# Patient Record
Sex: Female | Born: 1935 | Race: White | Hispanic: No | Smoking: Never smoker
Health system: Southern US, Community
[De-identification: ages and names within clinical notes are randomized; demographics above are authoritative.]

## PROBLEM LIST (undated history)

## (undated) DIAGNOSIS — G609 Hereditary and idiopathic neuropathy, unspecified: Secondary | ICD-10-CM

## (undated) DIAGNOSIS — I10 Essential (primary) hypertension: Secondary | ICD-10-CM

## (undated) DIAGNOSIS — F419 Anxiety disorder, unspecified: Secondary | ICD-10-CM

## (undated) DIAGNOSIS — E079 Disorder of thyroid, unspecified: Secondary | ICD-10-CM

## (undated) DIAGNOSIS — C50911 Malignant neoplasm of unspecified site of right female breast: Secondary | ICD-10-CM

## (undated) DIAGNOSIS — N189 Chronic kidney disease, unspecified: Secondary | ICD-10-CM

## (undated) DIAGNOSIS — A310 Pulmonary mycobacterial infection: Secondary | ICD-10-CM

## (undated) HISTORY — PX: BREAST LUMPECTOMY: SHX2

## (undated) HISTORY — PX: CHOLECYSTECTOMY: SHX55

## (undated) HISTORY — DX: Disorder of thyroid, unspecified: E07.9

## (undated) HISTORY — DX: Essential (primary) hypertension: I10

## (undated) HISTORY — PX: BACK SURGERY: SHX140

## (undated) HISTORY — PX: BREAST BIOPSY: SHX20

## (undated) HISTORY — DX: Anxiety disorder, unspecified: F41.9

## (undated) HISTORY — PX: BREAST SURGERY: SHX581

## (undated) HISTORY — DX: Chronic kidney disease, unspecified: N18.9

## (undated) HISTORY — DX: Hereditary and idiopathic neuropathy, unspecified: G60.9

---

## 1999-02-26 DIAGNOSIS — C50911 Malignant neoplasm of unspecified site of right female breast: Secondary | ICD-10-CM

## 1999-02-26 HISTORY — PX: BREAST EXCISIONAL BIOPSY: SUR124

## 1999-02-26 HISTORY — PX: BREAST LUMPECTOMY: SHX2

## 1999-02-26 HISTORY — DX: Malignant neoplasm of unspecified site of right female breast: C50.911

## 2001-04-24 ENCOUNTER — Other Ambulatory Visit: Admission: RE | Admit: 2001-04-24 | Discharge: 2001-04-24 | Payer: Self-pay | Admitting: Obstetrics and Gynecology

## 2004-05-14 ENCOUNTER — Ambulatory Visit: Payer: Self-pay | Admitting: Internal Medicine

## 2004-09-26 ENCOUNTER — Encounter: Payer: Self-pay | Admitting: Internal Medicine

## 2004-10-26 ENCOUNTER — Encounter: Payer: Self-pay | Admitting: Internal Medicine

## 2004-11-25 ENCOUNTER — Encounter: Payer: Self-pay | Admitting: Internal Medicine

## 2004-12-26 ENCOUNTER — Encounter: Payer: Self-pay | Admitting: Internal Medicine

## 2006-10-21 ENCOUNTER — Encounter: Payer: Self-pay | Admitting: Neurology

## 2006-10-27 ENCOUNTER — Encounter: Payer: Self-pay | Admitting: Neurology

## 2006-11-26 ENCOUNTER — Encounter: Payer: Self-pay | Admitting: Neurology

## 2008-06-08 ENCOUNTER — Emergency Department: Payer: Self-pay | Admitting: Emergency Medicine

## 2009-03-15 ENCOUNTER — Ambulatory Visit: Payer: Self-pay | Admitting: Gastroenterology

## 2009-08-21 ENCOUNTER — Emergency Department: Payer: Self-pay | Admitting: Emergency Medicine

## 2009-08-24 ENCOUNTER — Ambulatory Visit: Payer: Self-pay | Admitting: Orthopedic Surgery

## 2009-12-25 ENCOUNTER — Emergency Department: Payer: Self-pay | Admitting: Emergency Medicine

## 2010-08-11 ENCOUNTER — Emergency Department: Payer: Self-pay | Admitting: Emergency Medicine

## 2010-09-18 ENCOUNTER — Ambulatory Visit: Payer: Self-pay | Admitting: Otolaryngology

## 2010-12-31 DIAGNOSIS — H469 Unspecified optic neuritis: Secondary | ICD-10-CM | POA: Insufficient documentation

## 2010-12-31 DIAGNOSIS — H40009 Preglaucoma, unspecified, unspecified eye: Secondary | ICD-10-CM | POA: Insufficient documentation

## 2010-12-31 DIAGNOSIS — H53412 Scotoma involving central area, left eye: Secondary | ICD-10-CM | POA: Insufficient documentation

## 2010-12-31 DIAGNOSIS — IMO0002 Reserved for concepts with insufficient information to code with codable children: Secondary | ICD-10-CM | POA: Insufficient documentation

## 2011-07-15 DIAGNOSIS — C50919 Malignant neoplasm of unspecified site of unspecified female breast: Secondary | ICD-10-CM | POA: Insufficient documentation

## 2011-07-15 DIAGNOSIS — Z923 Personal history of irradiation: Secondary | ICD-10-CM | POA: Insufficient documentation

## 2012-07-03 ENCOUNTER — Ambulatory Visit: Payer: Self-pay | Admitting: Otolaryngology

## 2012-07-03 LAB — CREATININE, SERUM
Creatinine: 1.19 mg/dL (ref 0.60–1.30)
EGFR (African American): 51 — ABNORMAL LOW
EGFR (Non-African Amer.): 44 — ABNORMAL LOW

## 2013-09-19 DIAGNOSIS — F410 Panic disorder [episodic paroxysmal anxiety] without agoraphobia: Secondary | ICD-10-CM | POA: Insufficient documentation

## 2013-09-19 DIAGNOSIS — J479 Bronchiectasis, uncomplicated: Secondary | ICD-10-CM | POA: Insufficient documentation

## 2013-09-19 DIAGNOSIS — I119 Hypertensive heart disease without heart failure: Secondary | ICD-10-CM | POA: Insufficient documentation

## 2013-09-19 DIAGNOSIS — E039 Hypothyroidism, unspecified: Secondary | ICD-10-CM | POA: Insufficient documentation

## 2013-09-19 DIAGNOSIS — G500 Trigeminal neuralgia: Secondary | ICD-10-CM | POA: Insufficient documentation

## 2013-09-19 DIAGNOSIS — F325 Major depressive disorder, single episode, in full remission: Secondary | ICD-10-CM | POA: Insufficient documentation

## 2013-09-19 DIAGNOSIS — M8580 Other specified disorders of bone density and structure, unspecified site: Secondary | ICD-10-CM | POA: Insufficient documentation

## 2013-10-07 DIAGNOSIS — E782 Mixed hyperlipidemia: Secondary | ICD-10-CM | POA: Insufficient documentation

## 2014-10-12 DIAGNOSIS — Z0000 Encounter for general adult medical examination without abnormal findings: Secondary | ICD-10-CM | POA: Insufficient documentation

## 2015-09-06 ENCOUNTER — Encounter: Payer: Self-pay | Admitting: Psychiatry

## 2015-09-06 ENCOUNTER — Ambulatory Visit (INDEPENDENT_AMBULATORY_CARE_PROVIDER_SITE_OTHER): Payer: 59 | Admitting: Psychiatry

## 2015-09-06 DIAGNOSIS — I10 Essential (primary) hypertension: Secondary | ICD-10-CM | POA: Insufficient documentation

## 2015-09-06 DIAGNOSIS — F411 Generalized anxiety disorder: Secondary | ICD-10-CM | POA: Diagnosis not present

## 2015-09-06 DIAGNOSIS — F332 Major depressive disorder, recurrent severe without psychotic features: Secondary | ICD-10-CM | POA: Diagnosis not present

## 2015-09-06 MED ORDER — LORAZEPAM 0.5 MG PO TABS: 0.5000 mg | ORAL_TABLET | Freq: Three times a day (TID) | ORAL | Status: DC

## 2015-09-06 MED ORDER — ESCITALOPRAM OXALATE 5 MG PO TABS: 5.0000 mg | ORAL_TABLET | Freq: Every day | ORAL | Status: DC

## 2015-09-06 MED ORDER — QUETIAPINE FUMARATE 25 MG PO TABS: 25.0000 mg | ORAL_TABLET | Freq: Every day | ORAL | Status: DC

## 2015-09-06 MED ORDER — BUPROPION HCL 75 MG PO TABS: 75.0000 mg | ORAL_TABLET | Freq: Every day | ORAL | Status: DC

## 2015-09-06 NOTE — Progress Notes (Signed)
Psychiatric Initial Adult Assessment   Patient Identification: Christine Vaughan MRN:  [id number] Date of Evaluation:  09/06/2015 Referral Source: PCP Chief Complaint:   Visit Diagnosis:    ICD-9-CM ICD-10-CM   1. Severe episode of recurrent major depressive disorder, without psychotic features ([place]) 296.33 F33.2   2. GAD (generalized anxiety disorder) 300.02 F41.1     History of Present Illness:    Patient is a 82 year old female who presented for initial assessment accompanied by her her. She was referred by her primary care physician Dr. Ouida Sills. She reported that she has long history of depression and anxiety. She reported that she is unable to sleep has been having more anxiety. Her daughter reported that she currently lives with her husband. Patient has lack of energy and anhedonia. Her daughter reported that the daughters decided to help the parents and to bring them food 1-2 times per week. Patient is able to do her own laundry and her own chores. However she spends most of the time watching TV. 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 Patient was pleasant and cooperative during the interview. However she reported that she does not find any energy to do anything at house. Her husband is the same. Patient reported that she wants to have her medications adjusted at this time. She currently denied using any drugs or alcohol. She has been getting her medications to her primary care physician.   Associated Signs/Symptoms: Depression Symptoms:  depressed mood, anhedonia, psychomotor retardation, fatigue, feelings of worthlessness/guilt, difficulty concentrating, hopelessness, anxiety, loss of energy/fatigue, disturbed sleep, weight loss, decreased appetite, (Hypo) Manic Symptoms:  none Anxiety Symptoms:  Excessive Worry, Psychotic Symptoms:  none PTSD  Symptoms: Negative NA  Past Psychiatric History:   Patient reported that she has tried some medications in the past but is unable to recall the names. She does not have any history of admission to a psychiatric hospital.  Previous Psychotropic Medications:  Unable to remember the names.  Substance Abuse History in the last 12 months:  No.  Consequences of Substance Abuse: Negative NA  Past Medical History:  Past Medical History  Diagnosis Date  . Thyroid disease   . Chronic kidney disease   . Anxiety   . Hypertension   . Hypertension     Past Surgical History  Procedure Laterality Date  . Breast surgery    . Breast lumpectomy    . Breast lumpectomy      Family Psychiatric History: Depression in her mother. Family History:  Family History  Problem Relation Age of Onset  . Depression Mother     Social History:   Social History   Social History  . Marital Status: Unknown    Spouse Name: N/A  . Number of Children: N/A  . Years of Education: N/A   Social History Main Topics  . Smoking status: Never Smoker   . Smokeless tobacco: Not on file  . Alcohol Use: No  . Drug Use: No  . Sexual Activity: No   Other Topics Concern  . Not on file   Social History Narrative  . No narrative on file    Additional Social History: She lives with her husband. She has 4 daughters and they are very supportive.  Allergies:   Allergies  Allergen Reactions  . Penicillins   . Sulfur     Metabolic Disorder Labs:  No results found for: HGBA1C, MPG No results found for: PROLACTIN No results found for: CHOL, TRIG, HDL, CHOLHDL, VLDL, LDLCALC   Current Medications: Current Outpatient Prescriptions  Medication Sig Dispense Refill  . aspirin 81 MG tablet Take 81 mg by mouth daily.    [name] azelastine (ASTELIN) 0.1 % nasal spray Place into the nose 2 (two) times daily. Use in each nostril as directed    . levothyroxine (SYNTHROID, LEVOTHROID) 50 MCG tablet Take 50 mcg by  mouth daily before breakfast.    . LORazepam (ATIVAN) 0.5 MG tablet Take 1 tablet (0.5 mg total) by mouth every 8 (eight) hours. Change 1/2 pill prn - pt has supply 30 tablet 0  . metoprolol (LOPRESSOR) 50 MG tablet Take 50 mg by mouth 2 (two) times daily.    . QUEtiapine (SEROQUEL) 25 MG tablet Take 1 tablet (25 mg total) by mouth at bedtime. 30 tablet 0  . RABEprazole (ACIPHEX) 20 MG tablet Take 20 mg by mouth 2 (two) times daily.    . raloxifene (EVISTA) 60 MG tablet Take 60 mg by mouth daily.    [name] buPROPion (WELLBUTRIN) 75 MG tablet Take 1 tablet (75 mg total) by mouth daily after breakfast. 15 tablet 0  . escitalopram (LEXAPRO) 5 MG tablet Take 1 tablet (5 mg total) by mouth daily. 30 tablet 0   No current facility-administered medications for this visit.    Neurologic: Headache: No Seizure: No Paresthesias:No  Musculoskeletal: Strength & Muscle Tone: decreased Gait & Station: normal Patient leans: N/A  Psychiatric Specialty Exam: ROS  There were no vitals taken for this visit.There is no height or weight on file to calculate BMI.  General Appearance: Casual  Eye Contact:  Fair  Speech:  Slow  Volume:  Normal  Mood:  Anxious  Affect:  Congruent  Thought Process:  Coherent and Goal Directed  Orientation:  Full (Time, Place, and Person)  Thought Content:  WDL  Suicidal Thoughts:  No  Homicidal Thoughts:  No  Memory:  Immediate;   Fair Recent;   Fair  Judgement:  Fair  Insight:  Fair  Psychomotor Activity:  Normal  Concentration:  Concentration: Fair and Attention Span: Fair  Recall:  [employer] of Knowledge:Fair  Language: Fair  Akathisia:  No  Handed:  Right  AIMS (if indicated):    Assets:  Communication Skills Desire for Improvement Physical Health Social Support  ADL's:  Intact  Cognition: WNL  Sleep:      Treatment Plan Summary: Medication management   Discussed with patient what the medications at length. I will adjust her medications as  follows  Wellbutrin  75 mg in the morning  Seroquel 25 mg at bedtime Ativan 0.5 mg at bedtime She will start taking Lexapro 5 mg in the morning  Advised her about the side effects of medication and she demonstrated understanding  Will follow up in 2 weeks or earlier depending on her symptoms  More than 50% of the time spent in psychoeducation, counseling and coordination of care.    This note was generated in part or whole with voice recognition software. Voice regonition is usually quite accurate but there are transcription errors that can and very often do occur. I apologize for any typographical errors that were not detected and corrected.    Rainey Pines, MD 7/12/20174:47 PM

## 2015-09-08 ENCOUNTER — Encounter: Payer: Self-pay | Admitting: Radiology

## 2015-09-08 ENCOUNTER — Emergency Department
Admission: EM | Admit: 2015-09-08 | Discharge: 2015-09-08 | Disposition: A | Discharge status: Home / Self Care | Payer: Medicare Other | Attending: Emergency Medicine | Admitting: Emergency Medicine

## 2015-09-08 ENCOUNTER — Emergency Department: Payer: Medicare Other

## 2015-09-08 DIAGNOSIS — R110 Nausea: Secondary | ICD-10-CM | POA: Insufficient documentation

## 2015-09-08 DIAGNOSIS — N189 Chronic kidney disease, unspecified: Secondary | ICD-10-CM | POA: Diagnosis not present

## 2015-09-08 DIAGNOSIS — R1084 Generalized abdominal pain: Secondary | ICD-10-CM | POA: Diagnosis not present

## 2015-09-08 DIAGNOSIS — Z79899 Other long term (current) drug therapy: Secondary | ICD-10-CM | POA: Insufficient documentation

## 2015-09-08 DIAGNOSIS — I129 Hypertensive chronic kidney disease with stage 1 through stage 4 chronic kidney disease, or unspecified chronic kidney disease: Secondary | ICD-10-CM | POA: Insufficient documentation

## 2015-09-08 DIAGNOSIS — Z7982 Long term (current) use of aspirin: Secondary | ICD-10-CM | POA: Insufficient documentation

## 2015-09-08 LAB — COMPREHENSIVE METABOLIC PANEL
ALT: 13 U/L — ABNORMAL LOW (ref 14–54)
AST: 23 U/L (ref 15–41)
Albumin: 4.2 g/dL (ref 3.5–5.0)
Alkaline Phosphatase: 57 U/L (ref 38–126)
Anion gap: 9 (ref 5–15)
BUN: 17 mg/dL (ref 6–20)
CO2: 25 mmol/L (ref 22–32)
Calcium: 9.3 mg/dL (ref 8.9–10.3)
Chloride: 98 mmol/L — ABNORMAL LOW (ref 101–111)
Creatinine, Ser: 1.04 mg/dL — ABNORMAL HIGH (ref 0.44–1.00)
GFR calc Af Amer: 57 mL/min — ABNORMAL LOW (ref >60)
GFR calc non Af Amer: 49 mL/min — ABNORMAL LOW (ref >60)
Glucose, Bld: 117 mg/dL — ABNORMAL HIGH (ref 65–99)
Potassium: 4 mmol/L (ref 3.5–5.1)
Sodium: 132 mmol/L — ABNORMAL LOW (ref 135–145)
Total Bilirubin: 1.1 mg/dL (ref 0.3–1.2)
Total Protein: 7.2 g/dL (ref 6.5–8.1)

## 2015-09-08 LAB — CBC
HCT: 38.1 % (ref 35.0–47.0)
Hemoglobin: 13.3 g/dL (ref 12.0–16.0)
MCH: 33.9 pg (ref 26.0–34.0)
MCHC: 34.8 g/dL (ref 32.0–36.0)
MCV: 97.3 fL (ref 80.0–100.0)
Platelets: 270 10*3/uL (ref 150–440)
RBC: 3.92 MIL/uL (ref 3.80–5.20)
RDW: 14.6 % — ABNORMAL HIGH (ref 11.5–14.5)
WBC: 5.3 10*3/uL (ref 3.6–11.0)

## 2015-09-08 LAB — URINALYSIS COMPLETE WITH MICROSCOPIC (ARMC ONLY)
Bacteria, UA: NONE SEEN
Bilirubin Urine: NEGATIVE
Glucose, UA: NEGATIVE mg/dL
Hgb urine dipstick: NEGATIVE
Ketones, ur: NEGATIVE mg/dL
Leukocytes, UA: NEGATIVE
Nitrite: NEGATIVE
Protein, ur: NEGATIVE mg/dL
Specific Gravity, Urine: 1.014 (ref 1.005–1.030)
pH: 6 (ref 5.0–8.0)

## 2015-09-08 LAB — LIPASE, BLOOD: Lipase: 28 U/L (ref 11–51)

## 2015-09-08 LAB — TROPONIN I: Troponin I: <0.03 ng/mL (ref <0.03)

## 2015-09-08 MED ORDER — SODIUM CHLORIDE 0.9 % IV BOLUS (SEPSIS)
1000.0000 mL | Freq: Once | INTRAVENOUS | Status: AC
  Administered 2015-09-08: 1000 mL via INTRAVENOUS

## 2015-09-08 MED ORDER — DIATRIZOATE MEGLUMINE & SODIUM 66-10 % PO SOLN
15.0000 mL | Freq: Once | ORAL | Status: AC
  Administered 2015-09-08: 15 mL via ORAL

## 2015-09-08 MED ORDER — IOPAMIDOL (ISOVUE-300) INJECTION 61%
100.0000 mL | Freq: Once | INTRAVENOUS | Status: AC | PRN
  Administered 2015-09-08: 100 mL via INTRAVENOUS

## 2015-09-08 MED ORDER — MORPHINE SULFATE (PF) 2 MG/ML IV SOLN
2.0000 mg | Freq: Once | INTRAVENOUS | Status: AC
  Administered 2015-09-08: 2 mg via INTRAVENOUS
  Filled 2015-09-08: qty 1

## 2015-09-08 MED ORDER — METOCLOPRAMIDE HCL 5 MG/ML IJ SOLN
10.0000 mg | Freq: Once | INTRAMUSCULAR | Status: AC
  Administered 2015-09-08: 10 mg via INTRAVENOUS
  Filled 2015-09-08: qty 2

## 2015-09-08 MED ORDER — METOCLOPRAMIDE HCL 10 MG PO TABS: 10.0000 mg | ORAL_TABLET | Freq: Four times a day (QID) | ORAL | Status: DC | PRN

## 2015-09-08 NOTE — ED Provider Notes (Signed)
[facility] Emergency Department Provider Note   ____________________________________________  Time seen: Approximately 6 PM  I have reviewed the triage vital signs and the nursing notes.   HISTORY  Chief Complaint Nausea   HPI Christine Vaughan is a 82 y.o. female with a history of thyroid as well as chronic kidney disease who is presenting to the emergency department with worsening nausea and abdominal pain. She says that she has had episodes of nausea over the past year which have been worsening. She said that recently she had her Lexapro as well as Wellbutrin adjusted and is concerned this may be a cause of her increased nausea which has been worsening over the past 2 days. She said that also today she has started to have 8 out of 10 abdominal pain which is diffuse but worse to the upper quadrants and of a burning quality. She has had no vomiting. Has had a 30-40 pound weight loss over the past year and had a CAT scan which was read as negative for any acute process in November. She was noted to have diverticulosis as that point.  However, she has not had pain until today. Denies any radiation of the pain.   Past Medical History  Diagnosis Date  . Thyroid disease   . Chronic kidney disease   . Anxiety   . Hypertension   . Hypertension     Patient Active Problem List   Diagnosis Date Noted  . Hypertension     Past Surgical History  Procedure Laterality Date  . Breast surgery    . Breast lumpectomy    . Breast lumpectomy      Current Outpatient Rx  Name  Route  Sig  Dispense  Refill  . aspirin 81 MG tablet   Oral   Take 81 mg by mouth daily.         [name] azelastine (ASTELIN) 0.1 % nasal spray   Nasal   Place into the nose 2 (two) times daily. Use in each nostril as directed         . buPROPion (WELLBUTRIN) 75 MG tablet   Oral   Take 1 tablet (75 mg total) by mouth daily after breakfast.   15 tablet   0   . escitalopram (LEXAPRO)  5 MG tablet   Oral   Take 1 tablet (5 mg total) by mouth daily.   30 tablet   0   . levothyroxine (SYNTHROID, LEVOTHROID) 50 MCG tablet   Oral   Take 50 mcg by mouth daily before breakfast.         . LORazepam (ATIVAN) 0.5 MG tablet   Oral   Take 1 tablet (0.5 mg total) by mouth every 8 (eight) hours. Change 1/2 pill prn - pt has supply   30 tablet   0   . metoprolol (LOPRESSOR) 50 MG tablet   Oral   Take 50 mg by mouth 2 (two) times daily.         . QUEtiapine (SEROQUEL) 25 MG tablet   Oral   Take 1 tablet (25 mg total) by mouth at bedtime.   30 tablet   0   . RABEprazole (ACIPHEX) 20 MG tablet   Oral   Take 20 mg by mouth 2 (two) times daily.         . raloxifene (EVISTA) 60 MG tablet   Oral   Take 60 mg by mouth daily.  Allergies Erythromycin; Penicillins; and Sulfur  Family History  Problem Relation Age of Onset  . Depression Mother     Social History Social History  Substance Use Topics  . Smoking status: Never Smoker   . Smokeless tobacco: None  . Alcohol Use: No    Review of Systems Constitutional: No fever/chills Eyes: No visual changes. ENT: No sore throat. Cardiovascular: Denies chest pain. Respiratory: Denies shortness of breath. Gastrointestinal:  No diarrhea.  No constipation. Genitourinary: Negative for dysuria. Musculoskeletal: Negative for back pain. Skin: Negative for rash. Neurological: Negative for headaches, focal weakness or numbness.  10-point ROS otherwise negative.  ____________________________________________   PHYSICAL EXAM:  VITAL SIGNS: ED Triage Vitals  Enc Vitals Group     BP 09/08/15 1352 145/93 mmHg     Pulse Rate 09/08/15 1352 79     Resp 09/08/15 1352 18     Temp 09/08/15 1352 98.4 F (36.9 C)     Temp Source 09/08/15 1352 Oral     SpO2 09/08/15 1352 96 %     Weight 09/08/15 1346 132 lb (59.875 kg)     Height 09/08/15 1346 5\' 6"  (1.676 m)     Head Cir --      Peak Flow --      Pain  Score 09/08/15 1737 9     Pain Loc --      Pain Edu? --      Excl. in [place]? --     Constitutional: Alert and oriented. Well appearing and in no acute distress. Eyes: Conjunctivae are normal. PERRL. EOMI. Head: Atraumatic. Nose: No congestion/rhinnorhea. Mouth/Throat: Mucous membranes are moist.   Neck: No stridor.   Cardiovascular: Normal rate, regular rhythm. Grossly normal heart sounds.  Good peripheral circulation. Respiratory: Normal respiratory effort.  No retractions. Lungs CTAB. Gastrointestinal: Soft with mild diffuse tenderness to palpation which is worse to the upper abdomen. Negative Murphy sign. No distention.  No CVA tenderness. Musculoskeletal: No lower extremity tenderness nor edema.  No joint effusions. Neurologic:  Normal speech and language. No gross focal neurologic deficits are appreciated.  Skin:  Skin is warm, dry and intact. No rash noted. Psychiatric: Mood and affect are normal. Speech and behavior are normal.  ____________________________________________   LABS (all labs ordered are listed, but only abnormal results are displayed)  Labs Reviewed  COMPREHENSIVE METABOLIC PANEL - Abnormal; Notable for the following:    Sodium 132 (*)    Chloride 98 (*)    Glucose, Bld 117 (*)    Creatinine, Ser 1.04 (*)    ALT 13 (*)    GFR calc non Af Amer 49 (*)    GFR calc Af Amer 57 (*)    All other components within normal limits  CBC - Abnormal; Notable for the following:    RDW 14.6 (*)    All other components within normal limits  URINALYSIS COMPLETEWITH MICROSCOPIC ([facility] ONLY) - Abnormal; Notable for the following:    Color, Urine YELLOW (*)    APPearance CLEAR (*)    Squamous Epithelial / LPF 0-5 (*)    All other components within normal limits  LIPASE, BLOOD  TROPONIN I   ____________________________________________  EKG  ED ECG REPORT I, Doran Stabler, the attending physician, personally viewed and interpreted this ECG.   Date: 09/08/2015   EKG Time: 1348  Rate: 77  Rhythm: normal sinus rhythm  Axis: Normal axis  Intervals:left anterior fascicular block  ST&T Change: No ST segment elevation or depression. T-wave  inversion in aVL. Similar EKG without any significant change from 08/21/2009.  ____________________________________________  RADIOLOGY   CT Abdomen Pelvis W Contrast (Final result) Result time: 09/08/15 19:31:41   Final result by Rad Results In Interface (09/08/15 19:31:41)   Narrative:   CLINICAL DATA: Acute generalized abdominal pain, nausea.  EXAM: CT ABDOMEN AND PELVIS WITH CONTRAST  TECHNIQUE: Multidetector CT imaging of the abdomen and pelvis was performed using the standard protocol following bolus administration of intravenous contrast.  CONTRAST: 130mL ISOVUE-300 IOPAMIDOL (ISOVUE-300) INJECTION 61%  COMPARISON: None.  FINDINGS: Bronchiectasis is noted in the right lower lobe and visualized portion of right middle lobe consistent with sequela of prior inflammation. No acute abnormality seen in the visualized lung bases. No significant osseous abnormality is noted.  Status post cholecystectomy. The liver and pancreas appear normal. Calcified granulomata are noted in the spleen. Adrenal glands and kidneys appear normal. No renal or ureteral calculi are noted. Minimal bilateral hydroureteronephrosis is noted secondary to mild urinary bladder distention. The appendix appears normal. There is no evidence of bowel obstruction. No abnormal fluid collection is noted. Uterus and ovaries are unremarkable. Sigmoid diverticulosis is noted without inflammation. No significant adenopathy is noted.  IMPRESSION: Minimal bilateral hydroureteronephrosis is noted secondary to mild urinary bladder distention.  No renal or ureteral calculi are noted.  Sigmoid diverticulosis is noted without inflammation.   Electronically Signed By: Marijo Conception, M.D. On: 09/08/2015 19:31     ____________________________________________   PROCEDURES   Procedures   ____________________________________________   INITIAL IMPRESSION / ASSESSMENT AND PLAN / ED COURSE  Pertinent labs & imaging results that were available during my care of the patient were reviewed by me and considered in my medical decision making (see chart for details).  ----------------------------------------- 9:00 PM on 09/08/2015 -----------------------------------------  Pain is greatly improved with morphine. Patient tolerated her by mouth contrast without vomiting. Still says that she feels nauseous. Will add Reglan for breakthrough nausea. Patient also is only taking 4 mg of Zofran and discussed him with Indocin milligrams every 8 hours as needed for nausea and vomiting. She understands this plan and is willing to comply. She also has not coming endoscopy and has her preop appointment this coming Monday. She will take him boost supplemental shake for supplemental nutrition at home and she has been doing. Unclear exactly what is causing her chronic symptoms over the past year however does not appear to be an emergent situation at this time which requires hospitalization. Discussed this with the family as well as the patient. Usual and customary return precautions given. ____________________________________________   FINAL CLINICAL IMPRESSION(S) / ED DIAGNOSES  Abdominal pain with nausea.    NEW MEDICATIONS STARTED DURING THIS VISIT:  New Prescriptions   No medications on file     Note:  This document was prepared using Dragon voice recognition software and may include unintentional dictation errors.    Orbie Pyo, MD 09/08/15 2101

## 2015-09-08 NOTE — ED Notes (Signed)
Lab notified to add troponin. Told lab already added.

## 2015-09-08 NOTE — ED Notes (Signed)
Patient drank 3/4 of contrast. C/o nausea and abd pain.  Assisted patient to the bathroom.

## 2015-09-08 NOTE — ED Notes (Signed)
Pt states shes lost about 35 lbs over the past year.  Has only eaten minimal amts of food for the past several days. Due for an endoscopy by Dr. Gustavo Lah.

## 2015-09-08 NOTE — Discharge Instructions (Signed)
Abdominal Pain, Adult Many things can cause belly (abdominal) pain. Most times, the belly pain is not dangerous. Many cases of belly pain can be watched and treated at home. HOME CARE   Do not take medicines that help you go poop (laxatives) unless told to by your doctor.  Only take medicine as told by your doctor.  Eat or drink as told by your doctor. Your doctor will tell you if you should be on a special diet. GET HELP IF:  You do not know what is causing your belly pain.  You have belly pain while you are sick to your stomach (nauseous) or have runny poop (diarrhea).  You have pain while you pee or poop.  Your belly pain wakes you up at night.  You have belly pain that gets worse or better when you eat.  You have belly pain that gets worse when you eat fatty foods.  You have a fever. GET HELP RIGHT AWAY IF:   The pain does not go away within 2 hours.  You keep throwing up (vomiting).  The pain changes and is only in the right or left part of the belly.  You have bloody or tarry looking poop. MAKE SURE YOU:   Understand these instructions.  Will watch your condition.  Will get help right away if you are not doing well or get worse.   This information is not intended to replace advice given to you by your health care provider. Make sure you discuss any questions you have with your health care provider.   Document Released: 07/31/2007 Document Revised: 03/04/2014 Document Reviewed: 10/21/2012 Elsevier Interactive Patient Education 2016 Elsevier Inc.  Nausea, Adult Nausea means you feel sick to your stomach or need to throw up (vomit). It may be a sign of a more serious problem. If nausea gets worse, you may throw up. If you throw up a lot, you may lose too much body fluid (dehydration). HOME CARE   Get plenty of rest.  Ask your doctor how to replace body fluid losses (rehydrate).  Eat small amounts of food. Sip liquids more often.  Take all medicines as told  by your doctor. GET HELP RIGHT AWAY IF:  You have a fever.  You pass out (faint).  You keep throwing up or have blood in your throw up.  You are very weak, have dry lips or a dry mouth, or you are very thirsty (dehydrated).  You have dark or bloody poop (stool).  You have very bad chest or belly (abdominal) pain.  You do not get better after 2 days, or you get worse.  You have a headache. MAKE SURE YOU:  Understand these instructions.  Will watch your condition.  Will get help right away if you are not doing well or get worse.   This information is not intended to replace advice given to you by your health care provider. Make sure you discuss any questions you have with your health care provider.   Document Released: 01/31/2011 Document Revised: 05/06/2011 Document Reviewed: 01/31/2011 Elsevier Interactive Patient Education [employer].

## 2015-09-08 NOTE — ED Notes (Signed)
Pt states that she has been nauseated for the past 24 hours, states that she hasn't vomited states that she has been able to keep down 3 crackers, pt has meds decreased in strength 3 days ago for her anxiety, pt has been dealing with nausea issues for the past year and has lost 30-40lbs without trying in the past year

## 2015-09-08 NOTE — ED Notes (Signed)
Dr. Clearnce Hasten notified patient only able to drink just over half of her contrast.  Per Dr. Clearnce Hasten ok for patient to go to CT.

## 2015-09-13 ENCOUNTER — Emergency Department: Payer: Medicare Other

## 2015-09-13 ENCOUNTER — Encounter: Payer: Self-pay | Admitting: Emergency Medicine

## 2015-09-13 ENCOUNTER — Emergency Department
Admission: EM | Admit: 2015-09-13 | Discharge: 2015-09-13 | Disposition: A | Discharge status: Home / Self Care | Payer: Medicare Other | Attending: Emergency Medicine | Admitting: Emergency Medicine

## 2015-09-13 ENCOUNTER — Other Ambulatory Visit: Payer: Self-pay

## 2015-09-13 DIAGNOSIS — W1800XA Striking against unspecified object with subsequent fall, initial encounter: Secondary | ICD-10-CM | POA: Insufficient documentation

## 2015-09-13 DIAGNOSIS — K5900 Constipation, unspecified: Secondary | ICD-10-CM | POA: Diagnosis not present

## 2015-09-13 DIAGNOSIS — Z853 Personal history of malignant neoplasm of breast: Secondary | ICD-10-CM | POA: Diagnosis not present

## 2015-09-13 DIAGNOSIS — I129 Hypertensive chronic kidney disease with stage 1 through stage 4 chronic kidney disease, or unspecified chronic kidney disease: Secondary | ICD-10-CM | POA: Insufficient documentation

## 2015-09-13 DIAGNOSIS — N189 Chronic kidney disease, unspecified: Secondary | ICD-10-CM | POA: Insufficient documentation

## 2015-09-13 DIAGNOSIS — E039 Hypothyroidism, unspecified: Secondary | ICD-10-CM | POA: Insufficient documentation

## 2015-09-13 DIAGNOSIS — R110 Nausea: Secondary | ICD-10-CM | POA: Diagnosis present

## 2015-09-13 DIAGNOSIS — Y999 Unspecified external cause status: Secondary | ICD-10-CM | POA: Insufficient documentation

## 2015-09-13 DIAGNOSIS — Z7982 Long term (current) use of aspirin: Secondary | ICD-10-CM | POA: Insufficient documentation

## 2015-09-13 DIAGNOSIS — Y929 Unspecified place or not applicable: Secondary | ICD-10-CM | POA: Diagnosis not present

## 2015-09-13 DIAGNOSIS — K2960 Other gastritis without bleeding: Secondary | ICD-10-CM | POA: Diagnosis not present

## 2015-09-13 DIAGNOSIS — K210 Gastro-esophageal reflux disease with esophagitis: Secondary | ICD-10-CM | POA: Diagnosis not present

## 2015-09-13 DIAGNOSIS — N390 Urinary tract infection, site not specified: Secondary | ICD-10-CM

## 2015-09-13 DIAGNOSIS — F419 Anxiety disorder, unspecified: Secondary | ICD-10-CM | POA: Diagnosis not present

## 2015-09-13 DIAGNOSIS — R634 Abnormal weight loss: Secondary | ICD-10-CM | POA: Diagnosis present

## 2015-09-13 DIAGNOSIS — Z79899 Other long term (current) drug therapy: Secondary | ICD-10-CM | POA: Insufficient documentation

## 2015-09-13 DIAGNOSIS — R109 Unspecified abdominal pain: Secondary | ICD-10-CM

## 2015-09-13 DIAGNOSIS — S0990XA Unspecified injury of head, initial encounter: Secondary | ICD-10-CM | POA: Insufficient documentation

## 2015-09-13 DIAGNOSIS — R1013 Epigastric pain: Secondary | ICD-10-CM | POA: Diagnosis present

## 2015-09-13 DIAGNOSIS — K317 Polyp of stomach and duodenum: Secondary | ICD-10-CM | POA: Diagnosis not present

## 2015-09-13 DIAGNOSIS — E079 Disorder of thyroid, unspecified: Secondary | ICD-10-CM | POA: Diagnosis not present

## 2015-09-13 DIAGNOSIS — F329 Major depressive disorder, single episode, unspecified: Secondary | ICD-10-CM | POA: Insufficient documentation

## 2015-09-13 DIAGNOSIS — K5909 Other constipation: Secondary | ICD-10-CM | POA: Diagnosis not present

## 2015-09-13 DIAGNOSIS — Z880 Allergy status to penicillin: Secondary | ICD-10-CM | POA: Diagnosis not present

## 2015-09-13 DIAGNOSIS — Y939 Activity, unspecified: Secondary | ICD-10-CM | POA: Diagnosis not present

## 2015-09-13 DIAGNOSIS — Z882 Allergy status to sulfonamides status: Secondary | ICD-10-CM | POA: Diagnosis not present

## 2015-09-13 LAB — COMPREHENSIVE METABOLIC PANEL
ALT: 12 U/L — ABNORMAL LOW (ref 14–54)
AST: 20 U/L (ref 15–41)
Albumin: 4.2 g/dL (ref 3.5–5.0)
Alkaline Phosphatase: 61 U/L (ref 38–126)
Anion gap: 9 (ref 5–15)
BUN: 11 mg/dL (ref 6–20)
CO2: 29 mmol/L (ref 22–32)
Calcium: 9.5 mg/dL (ref 8.9–10.3)
Chloride: 89 mmol/L — ABNORMAL LOW (ref 101–111)
Creatinine, Ser: 0.75 mg/dL (ref 0.44–1.00)
GFR calc Af Amer: >60 mL/min (ref >60)
GFR calc non Af Amer: >60 mL/min (ref >60)
Glucose, Bld: 115 mg/dL — ABNORMAL HIGH (ref 65–99)
Potassium: 3.7 mmol/L (ref 3.5–5.1)
Sodium: 127 mmol/L — ABNORMAL LOW (ref 135–145)
Total Bilirubin: 0.8 mg/dL (ref 0.3–1.2)
Total Protein: 7.5 g/dL (ref 6.5–8.1)

## 2015-09-13 LAB — CBC
HCT: 40 % (ref 35.0–47.0)
Hemoglobin: 14.2 g/dL (ref 12.0–16.0)
MCH: 33.9 pg (ref 26.0–34.0)
MCHC: 35.5 g/dL (ref 32.0–36.0)
MCV: 95.5 fL (ref 80.0–100.0)
Platelets: 252 10*3/uL (ref 150–440)
RBC: 4.19 MIL/uL (ref 3.80–5.20)
RDW: 14.5 % (ref 11.5–14.5)
WBC: 7.3 10*3/uL (ref 3.6–11.0)

## 2015-09-13 LAB — URINALYSIS COMPLETE WITH MICROSCOPIC (ARMC ONLY)
Bilirubin Urine: NEGATIVE
Glucose, UA: NEGATIVE mg/dL
Hgb urine dipstick: NEGATIVE
Ketones, ur: NEGATIVE mg/dL
Nitrite: NEGATIVE
Protein, ur: NEGATIVE mg/dL
Specific Gravity, Urine: 1.014 (ref 1.005–1.030)
pH: 6 (ref 5.0–8.0)

## 2015-09-13 LAB — TSH: TSH: 2.879 u[IU]/mL (ref 0.350–4.500)

## 2015-09-13 LAB — TROPONIN I
Troponin I: <0.03 ng/mL (ref <0.03)
Troponin I: <0.03 ng/mL (ref <0.03)

## 2015-09-13 LAB — BASIC METABOLIC PANEL
Anion gap: 9 (ref 5–15)
BUN: 9 mg/dL (ref 6–20)
CO2: 26 mmol/L (ref 22–32)
Calcium: 8.9 mg/dL (ref 8.9–10.3)
Chloride: 96 mmol/L — ABNORMAL LOW (ref 101–111)
Creatinine, Ser: 0.74 mg/dL (ref 0.44–1.00)
GFR calc Af Amer: >60 mL/min (ref >60)
GFR calc non Af Amer: >60 mL/min (ref >60)
Glucose, Bld: 102 mg/dL — ABNORMAL HIGH (ref 65–99)
Potassium: 3.5 mmol/L (ref 3.5–5.1)
Sodium: 131 mmol/L — ABNORMAL LOW (ref 135–145)

## 2015-09-13 LAB — LIPASE, BLOOD: Lipase: 39 U/L (ref 11–51)

## 2015-09-13 MED ORDER — ALUMINUM-MAGNESIUM-SIMETHICONE 200-200-20 MG/5ML PO SUSP: 30.0000 mL | Freq: Three times a day (TID) | ORAL | Status: DC

## 2015-09-13 MED ORDER — METOCLOPRAMIDE HCL 5 MG/ML IJ SOLN
10.0000 mg | Freq: Once | INTRAMUSCULAR | Status: AC
  Administered 2015-09-13: 10 mg via INTRAVENOUS
  Filled 2015-09-13: qty 2

## 2015-09-13 MED ORDER — POLYETHYLENE GLYCOL 3350 17 G PO PACK: 17.0000 g | PACK | Freq: Two times a day (BID) | ORAL | Status: AC

## 2015-09-13 MED ORDER — NITROFURANTOIN MONOHYD MACRO 100 MG PO CAPS: 100.0000 mg | ORAL_CAPSULE | Freq: Two times a day (BID) | ORAL | Status: AC

## 2015-09-13 MED ORDER — GI COCKTAIL ~~LOC~~
30.0000 mL | Freq: Once | ORAL | Status: AC
  Administered 2015-09-13: 30 mL via ORAL
  Filled 2015-09-13: qty 30

## 2015-09-13 MED ORDER — FAMOTIDINE IN NACL 20-0.9 MG/50ML-% IV SOLN
20.0000 mg | Freq: Once | INTRAVENOUS | Status: AC
  Administered 2015-09-13: 20 mg via INTRAVENOUS
  Filled 2015-09-13: qty 50

## 2015-09-13 MED ORDER — ALUM & MAG HYDROXIDE-SIMETH 200-200-20 MG/5ML PO SUSP
15.0000 mL | Freq: Once | ORAL | Status: AC
  Administered 2015-09-13: 15 mL via ORAL
  Filled 2015-09-13: qty 30

## 2015-09-13 MED ORDER — ONDANSETRON 4 MG PO TBDP
4.0000 mg | ORAL_TABLET | Freq: Once | ORAL | Status: AC | PRN
  Administered 2015-09-13: 4 mg via ORAL

## 2015-09-13 MED ORDER — NITROFURANTOIN MONOHYD MACRO 100 MG PO CAPS
100.0000 mg | ORAL_CAPSULE | Freq: Once | ORAL | Status: AC
  Administered 2015-09-13: 100 mg via ORAL
  Filled 2015-09-13: qty 1

## 2015-09-13 MED ORDER — SODIUM CHLORIDE 0.9 % IV BOLUS (SEPSIS)
1000.0000 mL | Freq: Once | INTRAVENOUS | Status: AC
  Administered 2015-09-13: 1000 mL via INTRAVENOUS

## 2015-09-13 MED ORDER — ONDANSETRON HCL 4 MG PO TABS: 4.0000 mg | ORAL_TABLET | Freq: Three times a day (TID) | ORAL | Status: AC | PRN

## 2015-09-13 MED ORDER — ONDANSETRON 4 MG PO TBDP
ORAL_TABLET | ORAL | Status: AC
  Filled 2015-09-13: qty 1

## 2015-09-13 NOTE — Discharge Instructions (Signed)
You have been seen in the Emergency Department (ED) for abdominal pain.  Your evaluation did not identify a clear cause of your symptoms but was generally reassuring.  Abdominal pain has many possible causes. Some aren't serious and get better on their own in a few days. Others need more testing and treatment. If your pain continues or gets worse, you need to be rechecked and may need more tests to find out what is wrong. You may need surgery to correct the problem.   Follow up with your doctor in 12-24 hours if you are still having abdominal pain. Otherwise follow up in 1-3 days for a re-check  Take Maalox 3 times a day before meals. Take zofran every 8 hours as needed for nausea and vomiting. Take macrobid for UTI twice a day for 5 days. Take Miralax 1 cap full twice a day for constipation for 3-5 days until your constipation is resolved.  Don't ignore new symptoms, such as fever, nausea and vomiting, new or worsening abdominal pain, urination problems, bloody diarrhea or bloody stools, black tarry stools, uncontrollable nausea and vomiting, and dizziness. These may be signs of a more serious problem. If you develop any of these you should be seen by your doctor immediately or return to the ED.   How can you care for yourself at home?  Rest until you feel better.  To prevent dehydration, drink plenty of fluids, enough so that your urine is light yellow or clear like water. Choose water and other caffeine-free clear liquids until you feel better. If you have kidney, heart, or liver disease and have to limit fluids, talk with your doctor before you increase the amount of fluids you drink.  If your stomach is upset, eat mild foods, such as rice, dry toast or crackers, bananas, and applesauce. Try eating several small meals instead of two or three large ones.  Wait until 48 hours after all symptoms have gone away before you have spicy foods, alcohol, and drinks that contain caffeine.  Do not eat foods  that are high in fat.  Avoid anti-inflammatory medicines such as aspirin, ibuprofen (Advil, Motrin), and naproxen (Aleve). These can cause stomach upset. Talk to your doctor if you take daily aspirin for another health problem.  When should you call for help?  Call 911 anytime you think you may need emergency care. For example, call if:  You passed out (lost consciousness).  You pass maroon or very bloody stools.  You vomit blood or what looks like coffee grounds.  You have new, severe belly pain.  Call your doctor now or seek immediate medical care if:  Your pain gets worse, especially if it becomes focused in one area of your belly.  You have a new or higher fever.  Your stools are black and look like tar, or they have streaks of blood.  You have unexpected vaginal bleeding.  You have symptoms of a urinary tract infection. These may include:  Pain when you urinate.  Urinating more often than usual.  Blood in your urine. You are dizzy or lightheaded, or you feel like you may faint. Watch closely for changes in your health, and be sure to contact your doctor if:  You are not getting better after 1 day (24 hours).

## 2015-09-13 NOTE — ED Notes (Signed)
Pt to ED with family from PCP Dr. Ouida Sills c/o nausea, abd pain, headache, and decreased urinary output.  Family states patient was seen on Friday for nausea and prescribed nausea medication but symptoms getting worse this week.  Family states patient eating and drinking less.  Pt presents A&Ox4, speaking in complete and coherent sentences.

## 2015-09-13 NOTE — ED Provider Notes (Signed)
[facility] Emergency Department Provider Note  ____________________________________________  Time seen: Approximately 12:54 PM  I have reviewed the triage vital signs and the nursing notes.   HISTORY  Chief Complaint Abdominal Pain; Nausea; and Headache   HPI TANISHI LAMBO is a 82 y.o. female a history of hypothyroidism, remote breast cancer status post lumpectomy, radiation, and chemotherapy in 2002, depression, anxiety, hypertension, and CKD who presents for evaluation of abdominal pain. Patient is accompanied by her 2 daughters. Patient has had over a year of nausea and anorexia. Has lost over 20 lbs. She is scheduled to undergo an endoscopy 5 days from now. She was seen here 5 days ago with epigastric abdominal pain. She received a dose of morphine, IV fluids, and Reglan with resolution of her symptoms. She underwent a CT scan which showed minimal bilateral hydroureteronephrosis secondary to mild urinary bladder distention. Her labs were within normal limits. Patient was then discharged home to follow up with her endoscopy. Since then she's been having daily epigastric abdominal pain that she describes as burning and sharp, constant, associated with severe nausea and anorexia. No vomiting, no dysuria, no hematuria, no fever, no chest pain, no shortness of breath, the pain is not pleuritic, no melena, no diarrhea, no constipation. Patient's last bowel movement was this morning. She is passing flatus. Her only prior abdominal surgery was cholecystectomy many years ago. Her daughter tried to reschedule her EGD for this week but they're booked and were unable to antecipate the test. This morning patient reports that she has not been able to urinate and just had dribbling. She denies urinary symptoms. Daughter also reports that patient fell and hit her head on the floor 5 days ago when she returned home from the hospital. No LOC.   Past Medical History  Diagnosis  Date  . Thyroid disease   . Chronic kidney disease   . Anxiety   . Hypertension   . Hypertension     Patient Active Problem List   Diagnosis Date Noted  . Hypertension     Past Surgical History  Procedure Laterality Date  . Breast surgery    . Breast lumpectomy    . Breast lumpectomy    . Back surgery    . Cholecystectomy      Current Outpatient Rx  Name  Route  Sig  Dispense  Refill  . acetaminophen (TYLENOL) 500 MG tablet   Oral   Take 500 mg by mouth daily as needed.         [name] aspirin 81 MG tablet   Oral   Take 81 mg by mouth daily.         [name] azelastine (ASTELIN) 0.1 % nasal spray   Nasal   Place into the nose 2 (two) times daily. Use in each nostril as directed         . buPROPion (WELLBUTRIN) 75 MG tablet   Oral   Take 1 tablet (75 mg total) by mouth daily after breakfast.   15 tablet   0   . escitalopram (LEXAPRO) 5 MG tablet   Oral   Take 1 tablet (5 mg total) by mouth daily.   30 tablet   0   . levothyroxine (SYNTHROID, LEVOTHROID) 50 MCG tablet   Oral   Take 50 mcg by mouth daily before breakfast.         . LORazepam (ATIVAN) 0.5 MG tablet   Oral   Take 1 tablet (0.5 mg total) by  mouth every 8 (eight) hours. Change 1/2 pill prn - pt has supply   30 tablet   0   . metoCLOPramide (REGLAN) 10 MG tablet   Oral   Take 1 tablet (10 mg total) by mouth every 6 (six) hours as needed.   12 tablet   0   . metoprolol (LOPRESSOR) 50 MG tablet   Oral   Take 50 mg by mouth 2 (two) times daily.         . QUEtiapine (SEROQUEL) 25 MG tablet   Oral   Take 1 tablet (25 mg total) by mouth at bedtime.   30 tablet   0   . RABEprazole (ACIPHEX) 20 MG tablet   Oral   Take 20 mg by mouth 2 (two) times daily.         . raloxifene (EVISTA) 60 MG tablet   Oral   Take 60 mg by mouth daily.         [name] aluminum-magnesium hydroxide-simethicone (MAALOX) [id number] MG/5ML SUSP   Oral   Take 30 mLs by mouth 4 (four) times daily -  before meals and  at bedtime.   1120 mL   0   . nitrofurantoin, macrocrystal-monohydrate, (MACROBID) 100 MG capsule   Oral   Take 1 capsule (100 mg total) by mouth 2 (two) times daily.   10 capsule   0   . ondansetron (ZOFRAN) 4 MG tablet   Oral   Take 1 tablet (4 mg total) by mouth every 8 (eight) hours as needed for nausea or vomiting.   20 tablet   1   . polyethylene glycol (MIRALAX / GLYCOLAX) packet   Oral   Take 17 g by mouth 2 (two) times daily.   14 each   0     Allergies Erythromycin; Sulfur; and Penicillins  Family History  Problem Relation Age of Onset  . Depression Mother     Social History Social History  Substance Use Topics  . Smoking status: Never Smoker   . Smokeless tobacco: None  . Alcohol Use: No    Review of Systems Constitutional: Negative for fever. Eyes: Negative for visual changes. ENT: Negative for sore throat. Cardiovascular: Negative for chest pain. Respiratory: Negative for shortness of breath. Gastrointestinal: + epigastric abdominal pain, nausea, and anorexia. No vomiting or diarrhea. Genitourinary: Negative for dysuria. Musculoskeletal: Negative for back pain. Skin: Negative for rash. Neurological: Negative for headaches, weakness or numbness.  ____________________________________________   PHYSICAL EXAM:  VITAL SIGNS: ED Triage Vitals  Enc Vitals Group     BP 09/13/15 1123 148/86 mmHg     Pulse Rate 09/13/15 1123 87     Resp 09/13/15 1123 20     Temp 09/13/15 1123 97.6 F (36.4 C)     Temp Source 09/13/15 1123 Oral     SpO2 09/13/15 1123 94 %     Weight 09/13/15 1123 130 lb (58.968 kg)     Height 09/13/15 1123 5\' 6"  (1.676 m)     Head Cir --      Peak Flow --      Pain Score 09/13/15 1123 9     Pain Loc --      Pain Edu? --      Excl. in [place]? --     Constitutional: Alert and oriented. Well appearing and in no apparent distress. HEENT:      Head: Normocephalic and atraumatic.         Eyes: Conjunctivae are normal. Sclera is  non-icteric. EOMI. PERRL      Mouth/Throat: Mucous membranes are moist.       Neck: Supple with no signs of meningismus. Cardiovascular: Regular rate and rhythm. No murmurs, gallops, or rubs. 2+ symmetrical distal pulses are present in all extremities. No JVD. Respiratory: Normal respiratory effort. Lungs are clear to auscultation bilaterally. No wheezes, crackles, or rhonchi.  Gastrointestinal: Soft, non tender, and non distended with positive bowel sounds. No rebound or guarding. Genitourinary: No CVA tenderness. Musculoskeletal: Nontender with normal range of motion in all extremities. No edema, cyanosis, or erythema of extremities. Neurologic: Normal speech and language. Face is symmetric. Moving all extremities. No gross focal neurologic deficits are appreciated. Skin: Skin is warm, dry and intact. No rash noted. Psychiatric: Mood and affect are normal. Speech and behavior are normal.  ____________________________________________   LABS (all labs ordered are listed, but only abnormal results are displayed)  Labs Reviewed  COMPREHENSIVE METABOLIC PANEL - Abnormal; Notable for the following:    Sodium 127 (*)    Chloride 89 (*)    Glucose, Bld 115 (*)    ALT 12 (*)    All other components within normal limits  URINALYSIS COMPLETEWITH MICROSCOPIC ([facility] ONLY) - Abnormal; Notable for the following:    Color, Urine YELLOW (*)    APPearance HAZY (*)    Leukocytes, UA TRACE (*)    Bacteria, UA RARE (*)    Squamous Epithelial / LPF 0-5 (*)    All other components within normal limits  BASIC METABOLIC PANEL - Abnormal; Notable for the following:    Sodium 131 (*)    Chloride 96 (*)    Glucose, Bld 102 (*)    All other components within normal limits  URINE CULTURE  LIPASE, BLOOD  CBC  TROPONIN I  TSH  TROPONIN I   ____________________________________________  EKG  ED ECG REPORT I, Rudene Re, the attending physician, personally viewed and interpreted this  ECG.  Sinus rhythm, rate of 83, normal intervals, left axis deviation, no ST elevations or depressions. Unchanged from prior. ____________________________________________  RADIOLOGY  KUB: Moderate stool throughout colon. Multiple colonic diverticula noted. No bowel dilatation or air-fluid level suggesting obstruction. No free air. Lung bases clear.  ____________________________________________   PROCEDURES  Procedure(s) performed: None Critical Care performed:  None ____________________________________________   INITIAL IMPRESSION / ASSESSMENT AND PLAN / ED COURSE  82 y.o. female a history of hypothyroidism, remote breast cancer status post lumpectomy, radiation, and chemotherapy in 2002, depression, anxiety, hypertension, and CKD who presents for evaluation of abdominal pain, nausea, and anorexia. Symptoms are somewhat chronic and patient has had a 20 pound weight loss over the course of the last year. However seems like the abdominal pain is been present for the course of one week. She was seen here 5 days ago with normal labs and CT scan with no acute findings. Patient continues to have epigastric crampy/ burning abdominal pain, nausea, and anorexia. No vomiting, no other symptoms. She looks dry and exam but is well-appearing and in no distress, her vital signs are within normal limits, rectal exam showing brown stool Hemoccult negative, her abdomen is soft with mild tenderness to deep palpation in the epigastric region. No signs or symptoms of obstruction. No acute findings on CT scan 5 days ago with same pain. Patient is not constipated. No evidence of GI bleeding on rectal exam. CT from 5 days ago showing mild urinary retention. Bladder scan showed 23ml pre void and 177 ml post void With  no tenderness suprapubically therefore unlikely that this is the cause of her abdominal pain. I do believe her symptoms are probably from gastritis versus peptic ulcer disease and the best diagnostic  modality for her at this time would be the endoscopy. We'll recheck her labs, kidney function, get a KUB. We'll give her IV fluids, IV Zofran, IV Pepcid, and a GI cocktail.   _________________________ 7:48 PM on 09/13/2015 -----------------------------------------  Labs WNL. UA concerning for UTI, patient was started on macrobid. KUB concerning for moderate stool burden. Patient is tolerating PO. Has app for EGD in 5 days. Will dc home on miralax, macrobid, zofran and maalox, and close follow up.  Pertinent labs & imaging results that were available during my care of the patient were reviewed by me and considered in my medical decision making (see chart for details).   I discussed my evaluation of the patient's symptoms, my clinical impression, and my proposed outpatient treatment plan with patient/ family members. We have discussed anticipatory guidance, scheduled follow-up, and careful return precautions. The patient expresses understanding and is comfortable with the discharge plan. All patient's questions were answered.    ____________________________________________   FINAL CLINICAL IMPRESSION(S) / ED DIAGNOSES  Final diagnoses:  Abdominal pain  Constipation, unspecified constipation type  UTI (lower urinary tract infection)      NEW MEDICATIONS STARTED DURING THIS VISIT:  New Prescriptions   ALUMINUM-MAGNESIUM HYDROXIDE-SIMETHICONE (MAALOX) 200-200-20 MG/5ML SUSP    Take 30 mLs by mouth 4 (four) times daily -  before meals and at bedtime.   NITROFURANTOIN, MACROCRYSTAL-MONOHYDRATE, (MACROBID) 100 MG CAPSULE    Take 1 capsule (100 mg total) by mouth 2 (two) times daily.   ONDANSETRON (ZOFRAN) 4 MG TABLET    Take 1 tablet (4 mg total) by mouth every 8 (eight) hours as needed for nausea or vomiting.   POLYETHYLENE GLYCOL (MIRALAX / GLYCOLAX) PACKET    Take 17 g by mouth 2 (two) times daily.     Note:  This document was prepared using Dragon voice recognition software and may  include unintentional dictation errors.    Rudene Re, MD 09/13/15 2002

## 2015-09-13 NOTE — ED Notes (Signed)
Family reports patient fell Friday and hit head.  Denies LOC.  Pt not seen for fall.

## 2015-09-13 NOTE — ED Notes (Signed)
MD at bedside. 

## 2015-09-14 LAB — URINE CULTURE

## 2015-09-19 ENCOUNTER — Encounter: Payer: Self-pay | Admitting: *Deleted

## 2015-09-20 ENCOUNTER — Ambulatory Visit: Payer: Medicare Other | Admitting: Anesthesiology

## 2015-09-20 ENCOUNTER — Encounter
Admission: RE | Disposition: A | Discharge status: Home / Self Care | Payer: Self-pay | Source: Ambulatory Visit | Attending: Gastroenterology

## 2015-09-20 ENCOUNTER — Encounter: Payer: Self-pay | Admitting: *Deleted

## 2015-09-20 ENCOUNTER — Ambulatory Visit
Admission: RE | Admit: 2015-09-20 | Discharge: 2015-09-20 | Disposition: A | Discharge status: Home / Self Care | Payer: Medicare Other | Source: Ambulatory Visit | Attending: Gastroenterology | Admitting: Gastroenterology

## 2015-09-20 DIAGNOSIS — K210 Gastro-esophageal reflux disease with esophagitis: Secondary | ICD-10-CM | POA: Insufficient documentation

## 2015-09-20 DIAGNOSIS — I129 Hypertensive chronic kidney disease with stage 1 through stage 4 chronic kidney disease, or unspecified chronic kidney disease: Secondary | ICD-10-CM | POA: Insufficient documentation

## 2015-09-20 DIAGNOSIS — Z79899 Other long term (current) drug therapy: Secondary | ICD-10-CM | POA: Insufficient documentation

## 2015-09-20 DIAGNOSIS — F419 Anxiety disorder, unspecified: Secondary | ICD-10-CM | POA: Insufficient documentation

## 2015-09-20 DIAGNOSIS — K2960 Other gastritis without bleeding: Secondary | ICD-10-CM | POA: Diagnosis not present

## 2015-09-20 DIAGNOSIS — Z880 Allergy status to penicillin: Secondary | ICD-10-CM | POA: Insufficient documentation

## 2015-09-20 DIAGNOSIS — E079 Disorder of thyroid, unspecified: Secondary | ICD-10-CM | POA: Insufficient documentation

## 2015-09-20 DIAGNOSIS — Z882 Allergy status to sulfonamides status: Secondary | ICD-10-CM | POA: Insufficient documentation

## 2015-09-20 DIAGNOSIS — Z7982 Long term (current) use of aspirin: Secondary | ICD-10-CM | POA: Insufficient documentation

## 2015-09-20 DIAGNOSIS — N189 Chronic kidney disease, unspecified: Secondary | ICD-10-CM | POA: Insufficient documentation

## 2015-09-20 DIAGNOSIS — K5909 Other constipation: Secondary | ICD-10-CM | POA: Insufficient documentation

## 2015-09-20 DIAGNOSIS — K317 Polyp of stomach and duodenum: Secondary | ICD-10-CM | POA: Insufficient documentation

## 2015-09-20 HISTORY — PX: ESOPHAGOGASTRODUODENOSCOPY (EGD) WITH PROPOFOL: SHX5813

## 2015-09-20 SURGERY — ESOPHAGOGASTRODUODENOSCOPY (EGD) WITH PROPOFOL
Anesthesia: General

## 2015-09-20 MED ORDER — PROPOFOL 10 MG/ML IV BOLUS: INTRAVENOUS | Status: DC | PRN

## 2015-09-20 MED ORDER — SODIUM CHLORIDE 0.9 % IV SOLN
INTRAVENOUS | Status: DC
  Administered 2015-09-20: 13:00:00 via INTRAVENOUS

## 2015-09-20 MED ORDER — SODIUM CHLORIDE 0.9 % IV SOLN: INTRAVENOUS | Status: DC

## 2015-09-20 MED ORDER — PROPOFOL 500 MG/50ML IV EMUL
INTRAVENOUS | Status: DC | PRN
  Administered 2015-09-20: 75 ug/kg/min via INTRAVENOUS

## 2015-09-20 NOTE — Transfer of Care (Signed)
Immediate Anesthesia Transfer of Care Note  Patient: Christine Vaughan  Procedure(s) Performed: Procedure(s): ESOPHAGOGASTRODUODENOSCOPY (EGD) WITH PROPOFOL (N/A)  Patient Location: PACU  Anesthesia Type:General  Level of Consciousness: awake and sedated  Airway & Oxygen Therapy: Patient Spontanous Breathing and Patient connected to nasal cannula oxygen  Post-op Assessment: Report given to RN and Post -op Vital signs reviewed and stable  Post vital signs: Reviewed and stable  Last Vitals:  Vitals:   09/20/15 1251 09/20/15 1410  BP: 94/74 101/81  Pulse:  62  Resp: 20 19  Temp: 37.1 C 36.2 C    Last Pain:  Vitals:   09/20/15 1410  TempSrc: Tympanic         Complications: No apparent anesthesia complications

## 2015-09-20 NOTE — Anesthesia Preprocedure Evaluation (Addendum)
Anesthesia Evaluation  Patient identified by MRN, date of birth, ID band Patient awake    Reviewed: Allergy & Precautions, NPO status , Patient's Chart, lab work & pertinent test results, reviewed documented beta blocker date and time   History of Anesthesia Complications Negative for: history of anesthetic complications  Airway Mallampati: II  TM Distance: >3 FB Neck ROM: Full    Dental  (+) Poor Dentition, Missing   Pulmonary neg pulmonary ROS, neg sleep apnea, neg COPD,    breath sounds clear to auscultation- rhonchi (-) wheezing      Cardiovascular hypertension, Pt. on medications (-) CAD and (-) Past MI  Rhythm:Regular Rate:Normal - Systolic murmurs and - Diastolic murmurs    Neuro/Psych Anxiety    GI/Hepatic negative GI ROS, Neg liver ROS,   Endo/Other  negative endocrine ROSneg diabetes  Renal/GU negative Renal ROS     Musculoskeletal negative musculoskeletal ROS (+)   Abdominal (+) - obese,   Peds  Hematology negative hematology ROS (+)   Anesthesia Other Findings Past Medical History: No date: Anxiety No date: Chronic kidney disease No date: Hypertension No date: Hypertension No date: Thyroid disease   Reproductive/Obstetrics                             Anesthesia Physical Anesthesia Plan  ASA: II  Anesthesia Plan: General   Post-op Pain Management:    Induction: Intravenous  Airway Management Planned: Natural Airway  Additional Equipment:   Intra-op Plan:   Post-operative Plan:   Informed Consent: I have reviewed the patients History and Physical, chart, labs and discussed the procedure including the risks, benefits and alternatives for the proposed anesthesia with the patient or authorized representative who has indicated his/her understanding and acceptance.     Plan Discussed with: CRNA and Anesthesiologist  Anesthesia Plan Comments:         Anesthesia Quick Evaluation

## 2015-09-20 NOTE — H&P (Signed)
Outpatient short stay form Pre-procedure 09/20/2015 1:38 PM Lollie Sails MD  Primary Physician: Dr. Frazier Richards  Reason for visit:  EGD  History of present illness:  Patient is a 82 year old female presenting today for EGD as above. She has a history have having at least 2 weeks of nausea and epigastric pain. Her appetite has been poor. She claims a 40 pound weight loss over the period of last several months. She also has issues with chronic constipation. She has been inconsistently taking MiraLAX. This does help her when she takes it cholecystectomy about 5 years ago. She does take some Pepto-Bismol at home. She has seen some black stools at those times but not otherwise. She has been prescribed a PPI but I'm unsure as to whether she is taking currently. She denies use of any current NSAID 4 days worth of high-dose ibuprofen after a tooth extraction in April. She takes no prescribed blood thinners.   Current Facility-Administered Medications:  .  0.9 %  sodium chloride infusion, , Intravenous, Continuous, Lollie Sails, MD  Prescriptions Prior to Admission  Medication Sig Dispense Refill Last Dose  . aluminum-magnesium hydroxide-simethicone (MAALOX) [id number] MG/5ML SUSP Take 30 mLs by mouth 4 (four) times daily -  before meals and at bedtime. 1120 mL 0 09/19/2015 at Unknown time  . aspirin 81 MG tablet Take 81 mg by mouth daily.   Past Week at Unknown time  . azelastine (ASTELIN) 0.1 % nasal spray Place into the nose 2 (two) times daily. Use in each nostril as directed   09/19/2015 at Unknown time  . buPROPion (WELLBUTRIN) 75 MG tablet Take 1 tablet (75 mg total) by mouth daily after breakfast. 15 tablet 0 09/19/2015 at Unknown time  . escitalopram (LEXAPRO) 5 MG tablet Take 1 tablet (5 mg total) by mouth daily. 30 tablet 0 09/19/2015 at Unknown time  . levothyroxine (SYNTHROID, LEVOTHROID) 50 MCG tablet Take 50 mcg by mouth daily before breakfast.   09/19/2015 at Unknown time  .  LORazepam (ATIVAN) 0.5 MG tablet Take 1 tablet (0.5 mg total) by mouth every 8 (eight) hours. Change 1/2 pill prn - pt has supply 30 tablet 0 09/20/2015 at Unknown time  . metoprolol (LOPRESSOR) 50 MG tablet Take 50 mg by mouth 2 (two) times daily.   09/19/2015 at Unknown time  . QUEtiapine (SEROQUEL) 25 MG tablet Take 1 tablet (25 mg total) by mouth at bedtime. 30 tablet 0 09/19/2015 at Unknown time  . RABEprazole (ACIPHEX) 20 MG tablet Take 20 mg by mouth 2 (two) times daily.   09/19/2015 at Unknown time  . raloxifene (EVISTA) 60 MG tablet Take 60 mg by mouth daily.   09/19/2015 at Unknown time  . acetaminophen (TYLENOL) 500 MG tablet Take 500 mg by mouth daily as needed.   09/12/2015 at Unknown time  . metoCLOPramide (REGLAN) 10 MG tablet Take 1 tablet (10 mg total) by mouth every 6 (six) hours as needed. (Patient not taking: Reported on 09/20/2015) 12 tablet 0 Not Taking at Unknown time  . ondansetron (ZOFRAN) 4 MG tablet Take 1 tablet (4 mg total) by mouth every 8 (eight) hours as needed for nausea or vomiting. 20 tablet 1      Allergies  Allergen Reactions  . Cortisone   . Erythromycin Other (See Comments)    Gi distress  . Sulfur Other (See Comments)    Doesn't remember  . Penicillins Rash    Has patient had a PCN reaction causing immediate rash, facial/tongue/throat  swelling, SOB or lightheadedness with hypotension: No Has patient had a PCN reaction causing severe rash involving mucus membranes or skin necrosis: No Has patient had a PCN reaction that required hospitalization No Has patient had a PCN reaction occurring within the last 10 years: no If all of the above answers are "NO", then may proceed with Cephalosporin use.     Past Medical History:  Diagnosis Date  . Anxiety   . Chronic kidney disease   . Hypertension   . Hypertension   . Thyroid disease     Review of systems:      Physical Exam    Heart and lungs: Regular rate and rhythm without rub or gallop, lungs are  bilaterally clear.    HEENT: Normocephalic atraumatic eyes are anicteric    Other:     Pertinant exam for procedure: Soft mild generalized discomfort nonfocal. Bowel sounds are positive. She is a bit scaphoid in appearance.    Planned proceedures: EGD and indicated procedures. I have discussed the risks benefits and complications of procedures to include not limited to bleeding, infection, perforation and the risk of sedation and the patient wishes to proceed.    Lollie Sails, MD Gastroenterology 09/20/2015  1:38 PM

## 2015-09-20 NOTE — Anesthesia Postprocedure Evaluation (Signed)
Anesthesia Post Note  Patient: Christine Vaughan  Procedure(s) Performed: Procedure(s) (LRB): ESOPHAGOGASTRODUODENOSCOPY (EGD) WITH PROPOFOL (N/A)  Patient location during evaluation: PACU Anesthesia Type: General Level of consciousness: awake and alert Pain management: pain level controlled Vital Signs Assessment: post-procedure vital signs reviewed and stable Respiratory status: spontaneous breathing, nonlabored ventilation and respiratory function stable Cardiovascular status: blood pressure returned to baseline and stable Postop Assessment: no signs of nausea or vomiting Anesthetic complications: no    Last Vitals:  Vitals:   09/20/15 1420 09/20/15 1430  BP: 124/60 100/85  Pulse: 62 64  Resp: 13 19  Temp:      Last Pain:  Vitals:   09/20/15 1410  TempSrc: Tympanic                 [name]

## 2015-09-20 NOTE — Anesthesia Procedure Notes (Signed)
Performed by: COOK-MARTIN, Wandra Babin Pre-anesthesia Checklist: Patient identified, Emergency Drugs available, Suction available, Patient being monitored and Timeout performed Patient Re-evaluated:Patient Re-evaluated prior to inductionOxygen Delivery Method: Nasal cannula Preoxygenation: Pre-oxygenation with 100% oxygen Intubation Type: IV induction Placement Confirmation: positive ETCO2 and CO2 detector       

## 2015-09-20 NOTE — Op Note (Signed)
[facility] Gastroenterology Patient Name: Christine Vaughan Procedure Date: 09/20/2015 1:43 PM MRN: [id number] Account #: [id number] Date of Birth: [birth year] Admit Type: Outpatient Age: 82 Room: [facility] ENDO ROOM 1 Gender: Female Note Status: Finalized Procedure:            Upper GI endoscopy Indications:          Epigastric abdominal pain, Nausea, Weight loss Providers:            Lollie Sails, MD Referring MD:         Ocie Cornfield. Ouida Sills MD, MD (Referring MD) Medicines:            Monitored Anesthesia Care Complications:        No immediate complications. Procedure:            Pre-Anesthesia Assessment:                       - ASA Grade Assessment: III - A patient with severe                        systemic disease.                       After obtaining informed consent, the endoscope was                        passed under direct vision. Throughout the procedure,                        the patient's blood pressure, pulse, and oxygen                        saturations were monitored continuously. The Endoscope                        was introduced through the mouth, and advanced to the                        third part of duodenum. The upper GI endoscopy was                        accomplished without difficulty. The patient tolerated                        the procedure well. Findings:      The Z-line was variable. Biopsies were taken with a cold forceps for       histology.      Multiple 1 to 2 mm sessile polyps with bleeding and no stigmata of       recent bleeding were found in the gastric body. Biopsies were taken with       a cold forceps for histology.      The cardia and gastric fundus were normal on retroflexion.      The exam of the stomach was otherwise normal.      Mild mucosal variance characterized by altered texture was found in the       third portion of the duodenum. Biopsies were taken with a cold forceps       for  histology. Impression:           - Z-line variable. Biopsied.                       -  Multiple gastric polyps. Biopsied.                       - Mucosal variant in the duodenum. Biopsied. Recommendation:       - Continue present medications.                       - Return to GI clinic in 2 weeks.                       - Await pathology results. Procedure Code(s):    --- Professional ---                       [phone], Esophagogastroduodenoscopy, flexible, transoral;                        with biopsy, single or multiple Diagnosis Code(s):    --- Professional ---                       K22.8, Other specified diseases of esophagus                       K31.7, Polyp of stomach and duodenum                       K31.89, Other diseases of stomach and duodenum                       R10.13, Epigastric pain                       R11.0, Nausea                       R63.4, Abnormal weight loss CPT copyright 2016 [employer]. All rights reserved. The codes documented in this report are preliminary and upon coder review may  be revised to meet current compliance requirements. Lollie Sails, MD 09/20/2015 2:04:35 PM This report has been signed electronically. Number of Addenda: 0 Note Initiated On: 09/20/2015 1:43 PM      [facility]

## 2015-09-21 ENCOUNTER — Encounter: Payer: Self-pay | Admitting: Gastroenterology

## 2015-09-22 LAB — SURGICAL PATHOLOGY

## 2015-09-25 ENCOUNTER — Ambulatory Visit (INDEPENDENT_AMBULATORY_CARE_PROVIDER_SITE_OTHER): Payer: 59 | Admitting: Psychiatry

## 2015-09-25 ENCOUNTER — Encounter: Payer: Self-pay | Admitting: Psychiatry

## 2015-09-25 VITALS — BP 136/83 | HR 65 | Temp 98.1°F

## 2015-09-25 DIAGNOSIS — F332 Major depressive disorder, recurrent severe without psychotic features: Secondary | ICD-10-CM | POA: Diagnosis not present

## 2015-09-25 DIAGNOSIS — F411 Generalized anxiety disorder: Secondary | ICD-10-CM | POA: Diagnosis not present

## 2015-09-25 MED ORDER — LORAZEPAM 0.5 MG PO TABS: 0.2500 mg | ORAL_TABLET | Freq: Two times a day (BID) | ORAL | 0 refills | Status: DC

## 2015-09-25 MED ORDER — QUETIAPINE FUMARATE 25 MG PO TABS: 25.0000 mg | ORAL_TABLET | Freq: Every day | ORAL | 0 refills | Status: DC

## 2015-09-25 MED ORDER — ESCITALOPRAM OXALATE 10 MG PO TABS: 10.0000 mg | ORAL_TABLET | Freq: Every day | ORAL | 0 refills | Status: DC

## 2015-09-25 NOTE — Progress Notes (Signed)
Psychiatric MD Follow up Note   Patient Identification: Christine Vaughan MRN:  [id number] Date of Evaluation:  09/25/2015 Referral Source: PCP Chief Complaint:   Chief Complaint    Follow-up; Medication Refill     Visit Diagnosis:    ICD-9-CM ICD-10-CM   1. Severe episode of recurrent major depressive disorder, without psychotic features ([place]) 296.33 F33.2   2. GAD (generalized anxiety disorder) 300.02 F41.1     History of Present Illness:    Patient is a 82 year old female who presented for Follow-up accompanied by her daughter She was referred by her primary care physician Dr. Ouida Sills. She reported that she has long history of depression and anxiety. She reported that she is able to decrease the dose of her lorazepam and is taking it at bedtime and on when necessary basis during the day. Her daughter remains supportive. Her daughter reported that she is improving since the dose of her medications have been decreased. She was noted to have a UTI and was given medications for the same. Her daughter has been helping her with the medications as she was very confused earlier and now responding to the current medications. Patient is able to respond well to the medications and her appetite is also improving. She is able to eat well and is also drinking ensure  during the daytime.   Her daughter reported that she is helping her with the medications and she remains supportive. Patient currently denied having any suicidal ideations or plans. She denied having any perceptual disturbances.   Associated Signs/Symptoms: Depression Symptoms:  depressed mood, anhedonia, psychomotor retardation, fatigue, feelings of worthlessness/guilt, difficulty concentrating, hopelessness, anxiety, loss of energy/fatigue, disturbed sleep, weight loss, decreased appetite, (Hypo) Manic Symptoms:  none Anxiety Symptoms:  Excessive Worry, Psychotic Symptoms:  none PTSD Symptoms: Negative NA  Past  Psychiatric History:   Patient reported that she has tried some medications in the past but is unable to recall the names. She does not have any history of admission to a psychiatric hospital.  Previous Psychotropic Medications:  Unable to remember the names.  Substance Abuse History in the last 12 months:  No.  Consequences of Substance Abuse: Negative NA  Past Medical History:  Past Medical History:  Diagnosis Date  . Anxiety   . Chronic kidney disease   . Hypertension   . Hypertension   . Thyroid disease     Past Surgical History:  Procedure Laterality Date  . BACK SURGERY    . BREAST LUMPECTOMY    . BREAST LUMPECTOMY    . BREAST SURGERY    . CHOLECYSTECTOMY    . ESOPHAGOGASTRODUODENOSCOPY (EGD) WITH PROPOFOL N/A 09/20/2015   Procedure: ESOPHAGOGASTRODUODENOSCOPY (EGD) WITH PROPOFOL;  Surgeon: Lollie Sails, MD;  Location: [facility] ENDOSCOPY;  Service: Endoscopy;  Laterality: N/A;    Family Psychiatric History: Depression in her mother. Family History:  Family History  Problem Relation Age of Onset  . Depression Mother     Social History:   Social History   Social History  . Marital status: Married    Spouse name: N/A  . Number of children: N/A  . Years of education: N/A   Social History Main Topics  . Smoking status: Never Smoker  . Smokeless tobacco: Never Used  . Alcohol use No  . Drug use: No  . Sexual activity: No   Other Topics Concern  . None   Social History Narrative  . None    Additional Social History: She lives with her husband. She  has 4 daughters and they are very supportive.  Allergies:   Allergies  Allergen Reactions  . Cortisone   . Erythromycin Other (See Comments)    Gi distress  . Sulfur Other (See Comments)    Doesn't remember  . Tizanidine Other (See Comments)    GI upset  . Penicillins Rash    Has patient had a PCN reaction causing immediate rash, facial/tongue/throat swelling, SOB or lightheadedness with  hypotension: No Has patient had a PCN reaction causing severe rash involving mucus membranes or skin necrosis: No Has patient had a PCN reaction that required hospitalization No Has patient had a PCN reaction occurring within the last 10 years: no If all of the above answers are "NO", then may proceed with Cephalosporin use.  . Sulfamethoxazole Rash    all over body    Metabolic Disorder Labs: No results found for: HGBA1C, MPG No results found for: PROLACTIN No results found for: CHOL, TRIG, HDL, CHOLHDL, VLDL, LDLCALC   Current Medications: Current Outpatient Prescriptions  Medication Sig Dispense Refill  . acetaminophen (TYLENOL) 500 MG tablet Take 500 mg by mouth daily as needed.    [name] aluminum-magnesium hydroxide-simethicone (MAALOX) [id number] MG/5ML SUSP Take 30 mLs by mouth 4 (four) times daily -  before meals and at bedtime. 1120 mL 0  . aspirin 81 MG tablet Take 81 mg by mouth daily.    [name] azelastine (ASTELIN) 0.1 % nasal spray Place into the nose 2 (two) times daily. Use in each nostril as directed    . buPROPion (WELLBUTRIN) 75 MG tablet Take 1 tablet (75 mg total) by mouth daily after breakfast. 15 tablet 0  . escitalopram (LEXAPRO) 5 MG tablet Take 1 tablet (5 mg total) by mouth daily. 30 tablet 0  . levothyroxine (SYNTHROID, LEVOTHROID) 50 MCG tablet Take 50 mcg by mouth daily before breakfast.    . LORazepam (ATIVAN) 0.5 MG tablet Take 1 tablet (0.5 mg total) by mouth every 8 (eight) hours. Change 1/2 pill prn - pt has supply 30 tablet 0  . metoCLOPramide (REGLAN) 10 MG tablet Take 1 tablet (10 mg total) by mouth every 6 (six) hours as needed. 12 tablet 0  . metoprolol (LOPRESSOR) 50 MG tablet Take 50 mg by mouth 2 (two) times daily.    . ondansetron (ZOFRAN) 4 MG tablet Take 1 tablet (4 mg total) by mouth every 8 (eight) hours as needed for nausea or vomiting. 20 tablet 1  . QUEtiapine (SEROQUEL) 25 MG tablet Take 1 tablet (25 mg total) by mouth at bedtime. 30 tablet 0  .  RABEprazole (ACIPHEX) 20 MG tablet Take 20 mg by mouth 2 (two) times daily.    . raloxifene (EVISTA) 60 MG tablet Take 60 mg by mouth daily.     No current facility-administered medications for this visit.     Neurologic: Headache: No Seizure: No Paresthesias:No  Musculoskeletal: Strength & Muscle Tone: decreased Gait & Station: normal Patient leans: N/A  Psychiatric Specialty Exam: ROS  Blood pressure 136/83, pulse 65, temperature 98.1 F (36.7 C), temperature source Oral.There is no height or weight on file to calculate BMI.  General Appearance: Casual  Eye Contact:  Fair  Speech:  Slow  Volume:  Normal  Mood:  Anxious  Affect:  Congruent  Thought Process:  Coherent and Goal Directed  Orientation:  Full (Time, Place, and Person)  Thought Content:  WDL  Suicidal Thoughts:  No  Homicidal Thoughts:  No  Memory:  Immediate;   Fair Recent;  Fair  Judgement:  Fair  Insight:  Fair  Psychomotor Activity:  Normal  Concentration:  Concentration: Fair and Attention Span: Fair  Recall:  [employer] of Knowledge:Fair  Language: Fair  Akathisia:  No  Handed:  Right  AIMS (if indicated):    Assets:  Communication Skills Desire for Improvement Physical Health Social Support  ADL's:  Intact  Cognition: WNL  Sleep:      Treatment Plan Summary: Medication management   Discussed with patient what the medications at length. I will adjust her medications as follows  D/c Wellbutrin   Seroquel 25 mg at bedtime Ativan 0.25mg  PO BID  She will start taking Lexapro 10  mg in the morning  Advised her about the side effects of medication and she demonstrated understanding  Will follow up in 2 weeks or earlier depending on her symptoms  More than 50% of the time spent in psychoeducation, counseling and coordination of care.    This note was generated in part or whole with voice recognition software. Voice regonition is usually quite accurate but there are transcription errors  that can and very often do occur. I apologize for any typographical errors that were not detected and corrected.    Rainey Pines, MD 7/31/201710:25 AM

## 2015-10-23 ENCOUNTER — Other Ambulatory Visit: Payer: Self-pay | Admitting: Psychiatry

## 2015-10-24 NOTE — Telephone Encounter (Signed)
refilled 

## 2015-12-25 ENCOUNTER — Ambulatory Visit: Payer: Medicare Other | Admitting: Neurology

## 2015-12-27 ENCOUNTER — Ambulatory Visit (INDEPENDENT_AMBULATORY_CARE_PROVIDER_SITE_OTHER): Payer: Medicare Other | Admitting: Neurology

## 2015-12-27 ENCOUNTER — Encounter: Payer: Self-pay | Admitting: Neurology

## 2015-12-27 VITALS — BP 132/85 | HR 72 | Ht 66.0 in | Wt 130.5 lb

## 2015-12-27 DIAGNOSIS — E538 Deficiency of other specified B group vitamins: Secondary | ICD-10-CM | POA: Insufficient documentation

## 2015-12-27 DIAGNOSIS — G4489 Other headache syndrome: Secondary | ICD-10-CM

## 2015-12-27 DIAGNOSIS — R413 Other amnesia: Secondary | ICD-10-CM | POA: Diagnosis not present

## 2015-12-27 DIAGNOSIS — G609 Hereditary and idiopathic neuropathy, unspecified: Secondary | ICD-10-CM | POA: Diagnosis not present

## 2015-12-27 DIAGNOSIS — R269 Unspecified abnormalities of gait and mobility: Secondary | ICD-10-CM

## 2015-12-27 HISTORY — DX: Hereditary and idiopathic neuropathy, unspecified: G60.9

## 2015-12-27 MED ORDER — ALPRAZOLAM 0.5 MG PO TABS: ORAL_TABLET | ORAL | 0 refills | Status: DC

## 2015-12-27 NOTE — Progress Notes (Signed)
Reason for visit: Headache  Referring physician: Dr. [name] is a 82 y.o. female  History of present illness:  Christine Vaughan is an 82 year old right-handed white female with a history of trigeminal neuralgia on the right V2 distribution in the past. She has also been seen by Dr. 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 The patient began developing a right parietal headache that started 1-2 years ago. Over time, this headache has become less frequent and less severe. The patient will note that the headache may come on if she flexes her head down and then comes back up again or if she turns her head from one side to the other. The patient states that the episodes occur 4 or 5 times a month, the pain lasts only a few seconds to a minute or 2. The patient does not have any other associated symptoms of numbness, nausea, vision changes, or weakness with the headache. The patient does report decreased vision in the left eye as above, she feels "weak all over". She has had some problems with feeling foggy headed over the last several months. The family believes that this started shortly after she began low-dose Ativan therapy. The patient has a peripheral neuropathy, she uses a walker for ambulation. She denies any recent falls or head trauma. The patient denies issues controlling the bowels or the bladder. She did have a CT scan of the brain in July 2017 that showed significant cortical atrophy, chronic small vessel changes were noted. No acute changes were seen. The patient is sent to this office for an evaluation of the headache.  Past Medical History:  Diagnosis Date  . Anxiety   . Chronic kidney disease   . Hereditary and idiopathic peripheral neuropathy 12/27/2015  . Hypertension   . Thyroid disease     Past Surgical History:  Procedure Laterality Date  . BACK SURGERY    . BREAST LUMPECTOMY    . BREAST SURGERY    . CHOLECYSTECTOMY      . ESOPHAGOGASTRODUODENOSCOPY (EGD) WITH PROPOFOL N/A 09/20/2015   Procedure: ESOPHAGOGASTRODUODENOSCOPY (EGD) WITH PROPOFOL;  Surgeon: Lollie Sails, MD;  Location: [facility] ENDOSCOPY;  Service: Endoscopy;  Laterality: N/A;    Family History  Problem Relation Age of Onset  . Depression Mother   . Brain cancer Mother     Social history:  reports that she has never smoked. She has never used smokeless tobacco. She reports that she does not drink alcohol or use drugs.  Medications:  Prior to Admission medications   Medication Sig Start Date End Date Taking? Authorizing Provider  acetaminophen (TYLENOL) 500 MG tablet Take 500 mg by mouth daily as needed.    Historical Provider, MD  aluminum-magnesium hydroxide-simethicone (MAALOX) [id number] MG/5ML SUSP Take 30 mLs by mouth 4 (four) times daily -  before meals and at bedtime. 09/13/15   Rudene Re, MD  aspirin 81 MG tablet Take 81 mg by mouth daily.    Historical Provider, MD  azelastine (ASTELIN) 0.1 % nasal spray Place into the nose 2 (two) times daily. Use in each nostril as directed    Historical Provider, MD  escitalopram (LEXAPRO) 10 MG tablet Take 1 tablet (10 mg total) by mouth daily. 10/24/15   Rainey Pines, MD  levothyroxine (SYNTHROID, LEVOTHROID) 50 MCG tablet Take 50 mcg by mouth daily before breakfast.    Historical Provider, MD  LORazepam (ATIVAN) 0.5 MG tablet Take 0.5 tablets (0.25  mg total) by mouth 2 (two) times daily. Change 1/2 pill prn - 09/25/15   Rainey Pines, MD  metoCLOPramide (REGLAN) 10 MG tablet Take 1 tablet (10 mg total) by mouth every 6 (six) hours as needed. 09/08/15   Orbie Pyo, MD  metoprolol (LOPRESSOR) 50 MG tablet Take 50 mg by mouth 2 (two) times daily.    Historical Provider, MD  ondansetron (ZOFRAN) 4 MG tablet Take 1 tablet (4 mg total) by mouth every 8 (eight) hours as needed for nausea or vomiting. 09/13/15 09/12/16  Rudene Re, MD  QUEtiapine (SEROQUEL) 25 MG tablet Take 1 tablet  (25 mg total) by mouth at bedtime. 09/25/15   Rainey Pines, MD  RABEprazole (ACIPHEX) 20 MG tablet Take 20 mg by mouth 2 (two) times daily.    Historical Provider, MD  raloxifene (EVISTA) 60 MG tablet Take 60 mg by mouth daily.    Historical Provider, MD      Allergies  Allergen Reactions  . Erythromycin Other (See Comments)    Gi distress  . Metronidazole     Other reaction(s): Abdominal Pain  . Sulfur Other (See Comments)    Doesn't remember  . Tizanidine Other (See Comments)    GI upset  . Penicillins Rash    Has patient had a PCN reaction causing immediate rash, facial/tongue/throat swelling, SOB or lightheadedness with hypotension: No Has patient had a PCN reaction causing severe rash involving mucus membranes or skin necrosis: No Has patient had a PCN reaction that required hospitalization No Has patient had a PCN reaction occurring within the last 10 years: no If all of the above answers are "NO", then may proceed with Cephalosporin use.  . Sulfamethoxazole Rash    all over body    ROS:  Out of a complete 14 system review of symptoms, the patient complains only of the following symptoms, and all other reviewed systems are negative.  Decreased appetite, fatigue, weight change. Hearing loss, runny nose Blurred vision Walking difficulty, neck pain Memory loss, dizziness, headache Depression, anxiety  Blood pressure 132/85, pulse 72, height 5\' 6"  (1.676 m), weight 130 lb 8 oz (59.2 kg).  Physical Exam  General: The patient is alert and cooperative at the time of the examination.  Eyes: Pupils are equal, round, and reactive to light. Discs are flat bilaterally.  Neck: The neck is supple, no carotid bruits are noted.  Respiratory: The respiratory examination is clear.  Cardiovascular: The cardiovascular examination reveals a regular rate and rhythm, no obvious murmurs or rubs are noted.  Neuromuscular: The patient lacks about 30 of full lateral rotation of the  cervical spine bilaterally.  Skin: Extremities are without significant edema.  Neurologic Exam  Mental status: The patient is alert and oriented x 3 at the time of the examination. The patient has apparent normal recent and remote memory, with an apparently normal attention span and concentration ability. Mini-Mental Status Examination done today shows a total score of 30/30.  Cranial nerves: Facial symmetry is present. There is good sensation of the face to pinprick and soft touch bilaterally. The strength of the facial muscles and the muscles to head turning and shoulder shrug are normal bilaterally. Speech is well enunciated, no aphasia or dysarthria is noted. Extraocular movements are full. Visual fields are full. The tongue is midline, and the patient has symmetric elevation of the soft palate. No obvious hearing deficits are noted.  Motor: The motor testing reveals 5 over 5 strength of all 4 extremities. Good symmetric  motor tone is noted throughout.  Sensory: Sensory testing is intact to pinprick, soft touch, vibration sensation, and position sense on the upper extremities. With the lower extremities, there is a stocking pattern pinprick sensory deficit up to the knees bilaterally, significant impairment of position sensation and vibration sensation in both feet is noted. No evidence of extinction is noted.  Coordination: Cerebellar testing reveals good finger-nose-finger and heel-to-shin bilaterally. Some apraxia with the use of the extremities is noted.  Gait and station: Gait is wide-based, unsteady. Tandem gait was not tested. The patient normally walks with a walker. Romberg is negative. No drift is seen.  Reflexes: Deep tendon reflexes are symmetric, but are depressed bilaterally. Toes are downgoing bilaterally.   CT head 09/13/15:  IMPRESSION: 1. No acute intracranial pathology. 2. Chronic microvascular disease and cerebral atrophy.  * CT scan images were reviewed online. I  agree with the written report.    Assessment/Plan:  1. Right parietal headache, possible occipital nerve mediated headache  2. Memory disorder  3. Peripheral neuropathy  4. Gait disorder  The patient has had a 1-2 year history of right parietal headaches that may be mediated through the right occipital nerve. The patient likely has some degree of cervical spondylosis that may be affecting the occipital nerve. The patient will be set up for MRI of the brain. She will undergo blood work to include a sedimentation rate. The patient will need to be followed for memory changes over time. The prior CT of the brain show significant cortical atrophy. She will follow-up in about 6 months. A prescription was given for Xanax for the MRI scan, the patient is claustrophobic.   Jill Alexanders MD 12/27/2015 7:02 PM  [facility] [address] [place],  [address]  Phone [phone] Fax [phone]

## 2015-12-27 NOTE — Patient Instructions (Signed)
   We will check blood work today and get MRI of the brain, follow the memory issues over time.

## 2015-12-28 ENCOUNTER — Telehealth: Payer: Self-pay

## 2015-12-28 LAB — VITAMIN B12: Vitamin B-12: 932 pg/mL (ref 211–946)

## 2015-12-28 LAB — RPR: RPR Ser Ql: NONREACTIVE

## 2015-12-28 LAB — SEDIMENTATION RATE: Sed Rate: 18 mm/hr (ref 0–40)

## 2015-12-28 NOTE — Telephone Encounter (Signed)
-----   Message from Kathrynn Ducking, MD sent at 12/28/2015  7:25 AM EDT -----   The blood work results are unremarkable. Please call the patient.  ----- Message ----- From: Lavone Neri Lab Results In Sent: 12/28/2015   5:40 AM To: Kathrynn Ducking, MD

## 2015-12-28 NOTE — Telephone Encounter (Signed)
Called and spoke to pt's husband. Gave unremarkable lab results. Verbalized understanding and appreciation for call.

## 2016-01-11 DIAGNOSIS — G4489 Other headache syndrome: Secondary | ICD-10-CM | POA: Diagnosis not present

## 2016-01-30 ENCOUNTER — Other Ambulatory Visit: Payer: Self-pay | Admitting: *Deleted

## 2016-01-30 ENCOUNTER — Inpatient Hospital Stay
Admission: RE | Admit: 2016-01-30 | Discharge: 2016-01-30 | Disposition: A | Discharge status: Home / Self Care | Payer: Self-pay | Source: Ambulatory Visit | Attending: *Deleted | Admitting: *Deleted

## 2016-01-30 DIAGNOSIS — Z9289 Personal history of other medical treatment: Secondary | ICD-10-CM

## 2016-02-09 ENCOUNTER — Other Ambulatory Visit: Payer: Self-pay | Admitting: Internal Medicine

## 2016-02-09 DIAGNOSIS — N644 Mastodynia: Secondary | ICD-10-CM

## 2016-03-05 ENCOUNTER — Ambulatory Visit
Admission: RE | Admit: 2016-03-05 | Discharge: 2016-03-05 | Disposition: A | Discharge status: Home / Self Care | Payer: Medicare Other | Source: Ambulatory Visit | Attending: Internal Medicine | Admitting: Internal Medicine

## 2016-03-05 ENCOUNTER — Other Ambulatory Visit: Payer: Self-pay | Admitting: Psychiatry

## 2016-03-05 DIAGNOSIS — N644 Mastodynia: Secondary | ICD-10-CM

## 2016-03-06 ENCOUNTER — Telehealth: Payer: Self-pay

## 2016-03-06 NOTE — Telephone Encounter (Signed)
pt had called and left message that she neededs refills on her seroquel and lexapro.

## 2016-03-06 NOTE — Telephone Encounter (Signed)
called pharmancy called in ok for refill on lexapro and for the seroquel with no additional refills.

## 2016-03-20 ENCOUNTER — Ambulatory Visit (INDEPENDENT_AMBULATORY_CARE_PROVIDER_SITE_OTHER): Payer: 59 | Admitting: Psychiatry

## 2016-03-20 ENCOUNTER — Encounter: Payer: Self-pay | Admitting: Psychiatry

## 2016-03-20 VITALS — BP 128/80 | HR 81 | Temp 97.3°F | Wt 134.4 lb

## 2016-03-20 DIAGNOSIS — F331 Major depressive disorder, recurrent, moderate: Secondary | ICD-10-CM | POA: Diagnosis not present

## 2016-03-20 DIAGNOSIS — F411 Generalized anxiety disorder: Secondary | ICD-10-CM

## 2016-03-20 MED ORDER — ESCITALOPRAM OXALATE 10 MG PO TABS
10.0000 mg | ORAL_TABLET | Freq: Every day | ORAL | 1 refills | Status: DC
Start: 2016-03-20 — End: 2016-07-24

## 2016-03-20 MED ORDER — QUETIAPINE FUMARATE 25 MG PO TABS: 25.0000 mg | ORAL_TABLET | Freq: Every day | ORAL | 1 refills | Status: DC

## 2016-03-20 NOTE — Progress Notes (Signed)
Psychiatric MD Follow up Note   Patient Identification: Christine Vaughan MRN:  [id number] Date of Evaluation:  03/20/2016 Referral Source: PCP Chief Complaint:   Chief Complaint    Follow-up; Medication Refill     Visit Diagnosis:    ICD-9-CM ICD-10-CM   1. MDD (major depressive disorder), recurrent episode, moderate (HCC) 296.32 F33.1   2. GAD (generalized anxiety disorder) 300.02 F41.1     History of Present Illness:    Patient is a 83 year old female who presented for Follow-up accompanied by her daughter.[name] And was last seen in July. She reported that she was getting her medication refilled by Dr. Ouida Sills. She reported that she takes half a pill of lorazepam at night to help her sleep. Her daughter was concerned about taking her lorazepam at night. She reported that she cannot sleep. She also takes Lexapro and Seroquel. She appears pleasant and cooperative. However she appears confused in the morning and her husband has to poke her breakfast. We discussed about stopping the lorazepam on a daily basis as it is causing her confusion. Advised patient to start taking melatonin and she agreed with the plan. Her daughter remains supportive. She stated that she does not have any memory issues at this time. Patient was pleasant and cooperative and was able to all the questions appropriately.  . She is able to eat well and is also drinking ensure  during the daytime.   Her daughter reported that she is helping her with the medications and she remains supportive. Patient currently denied having any suicidal ideations or plans. She denied having any perceptual disturbances.   Associated Signs/Symptoms: Depression Symptoms:  depressed mood, anhedonia, fatigue, difficulty concentrating, anxiety, disturbed sleep, (Hypo) Manic Symptoms:  none Anxiety Symptoms:  Excessive Worry, Psychotic Symptoms:  none PTSD Symptoms: Negative NA  Past Psychiatric History:   Patient reported that she  has tried some medications in the past but is unable to recall the names. She does not have any history of admission to a psychiatric hospital.  Previous Psychotropic Medications:  Unable to remember the names.  Substance Abuse History in the last 12 months:  No.  Consequences of Substance Abuse: Negative NA  Past Medical History:  Past Medical History:  Diagnosis Date  . Anxiety   . Chronic kidney disease   . Hereditary and idiopathic peripheral neuropathy 12/27/2015  . Hypertension   . Thyroid disease     Past Surgical History:  Procedure Laterality Date  . BACK SURGERY    . BREAST BIOPSY Left    surgical bx pt dose not remember  . BREAST EXCISIONAL BIOPSY Right 2001   + chem rad and armidex  . BREAST LUMPECTOMY    . BREAST SURGERY    . CHOLECYSTECTOMY    . ESOPHAGOGASTRODUODENOSCOPY (EGD) WITH PROPOFOL N/A 09/20/2015   Procedure: ESOPHAGOGASTRODUODENOSCOPY (EGD) WITH PROPOFOL;  Surgeon: Lollie Sails, MD;  Location: [facility] ENDOSCOPY;  Service: Endoscopy;  Laterality: N/A;    Family Psychiatric History: Depression in her mother. Family History:  Family History  Problem Relation Age of Onset  . Depression Mother   . Brain cancer Mother     Social History:   Social History   Social History  . Marital status: Married    Spouse name: N/A  . Number of children: 4  . Years of education: N/A   Occupational History  . Retired    Social History Main Topics  . Smoking status: Never Smoker  . Smokeless tobacco: Never Used  .  Alcohol use No  . Drug use: No  . Sexual activity: No   Other Topics Concern  . None   Social History Narrative   Lives at home w/ her husband   Right-hand   Caffeine: none    Additional Social History: She lives with her husband. She has 4 daughters and they are very supportive.  Allergies:   Allergies  Allergen Reactions  . Erythromycin Other (See Comments)    Gi distress  . Metronidazole     Other reaction(s): Abdominal  Pain  . Sulfur Other (See Comments)    Doesn't remember  . Tizanidine Other (See Comments)    GI upset  . Penicillins Rash    Has patient had a PCN reaction causing immediate rash, facial/tongue/throat swelling, SOB or lightheadedness with hypotension: No Has patient had a PCN reaction causing severe rash involving mucus membranes or skin necrosis: No Has patient had a PCN reaction that required hospitalization No Has patient had a PCN reaction occurring within the last 10 years: no If all of the above answers are "NO", then may proceed with Cephalosporin use.  . Sulfamethoxazole Rash    all over body    Metabolic Disorder Labs: No results found for: HGBA1C, MPG No results found for: PROLACTIN No results found for: CHOL, TRIG, HDL, CHOLHDL, VLDL, LDLCALC   Current Medications: Current Outpatient Prescriptions  Medication Sig Dispense Refill  . acetaminophen (TYLENOL) 500 MG tablet Take 250 mg by mouth daily as needed.     . ALPRAZolam (XANAX) 0.5 MG tablet Take 2 tablets approximately 45 minutes prior to the MRI study, take a third tablet if needed. 3 tablet 0  . azelastine (ASTELIN) 0.1 % nasal spray Place into the nose 2 (two) times daily. Use in each nostril as directed    . escitalopram (LEXAPRO) 10 MG tablet Take 1 tablet (10 mg total) by mouth daily. 30 tablet 3  . levothyroxine (SYNTHROID, LEVOTHROID) 50 MCG tablet Take 50 mcg by mouth daily before breakfast.    . LORazepam (ATIVAN) 0.5 MG tablet Take 0.5 tablets (0.25 mg total) by mouth 2 (two) times daily. Change 1/2 pill prn - 15 tablet 0  . metoprolol (LOPRESSOR) 50 MG tablet Take 50 mg by mouth 2 (two) times daily.    . ondansetron (ZOFRAN) 4 MG tablet Take 1 tablet (4 mg total) by mouth every 8 (eight) hours as needed for nausea or vomiting. 20 tablet 1  . QUEtiapine (SEROQUEL) 25 MG tablet Take 1 tablet (25 mg total) by mouth at bedtime. 30 tablet 0  . RABEprazole (ACIPHEX) 20 MG tablet Take 20 mg by mouth 2 (two) times  daily.    . raloxifene (EVISTA) 60 MG tablet Take 60 mg by mouth daily.    . sucralfate (CARAFATE) 1 g tablet      No current facility-administered medications for this visit.     Neurologic: Headache: No Seizure: No Paresthesias:No  Musculoskeletal: Strength & Muscle Tone: decreased Gait & Station: normal Patient leans: N/A  Psychiatric Specialty Exam: ROS  Blood pressure 128/80, pulse 81, temperature 97.3 F (36.3 C), temperature source Oral, weight 134 lb 6.4 oz (61 kg).Body mass index is 21.69 kg/m.  General Appearance: Casual  Eye Contact:  Fair  Speech:  Slow  Volume:  Normal  Mood:  Anxious  Affect:  Congruent  Thought Process:  Coherent and Goal Directed  Orientation:  Full (Time, Place, and Person)  Thought Content:  WDL  Suicidal Thoughts:  No  Homicidal  Thoughts:  No  Memory:  Immediate;   Fair Recent;   Fair  Judgement:  Fair  Insight:  Fair  Psychomotor Activity:  Normal  Concentration:  Concentration: Fair and Attention Span: Fair  Recall:  [employer] of Knowledge:Fair  Language: Fair  Akathisia:  No  Handed:  Right  AIMS (if indicated):    Assets:  Communication Skills Desire for Improvement Physical Health Social Support  ADL's:  Intact  Cognition: WNL  Sleep:      Treatment Plan Summary: Medication management   Discussed with patient what the medications at length. I will adjust her medications as follows  Seroquel 25 mg at bedtime Ativan 0.25mg  PO BID -patient has supply and was prescribed by Dr. Ouida Sills. Advised patient to stop taking the lorazepam. She will start taking Lexapro 10  mg in the morning  Advised her about the side effects of medication and she demonstrated understanding  Will follow up in 2 months or earlier depending on her symptoms  More than 50% of the time spent in psychoeducation, counseling and coordination of care.    This note was generated in part or whole with voice recognition software. Voice regonition  is usually quite accurate but there are transcription errors that can and very often do occur. I apologize for any typographical errors that were not detected and corrected.    Rainey Pines, MD 1/24/20182:09 PM

## 2016-06-27 ENCOUNTER — Ambulatory Visit: Payer: Medicare Other | Admitting: Neurology

## 2016-07-15 ENCOUNTER — Other Ambulatory Visit: Payer: Self-pay | Admitting: Physician Assistant

## 2016-07-15 DIAGNOSIS — R9389 Abnormal findings on diagnostic imaging of other specified body structures: Secondary | ICD-10-CM

## 2016-07-15 DIAGNOSIS — J189 Pneumonia, unspecified organism: Secondary | ICD-10-CM

## 2016-07-19 ENCOUNTER — Ambulatory Visit
Admission: RE | Admit: 2016-07-19 | Discharge: 2016-07-19 | Disposition: A | Discharge status: Home / Self Care | Payer: Medicare Other | Source: Ambulatory Visit | Attending: Physician Assistant | Admitting: Physician Assistant

## 2016-07-19 DIAGNOSIS — J189 Pneumonia, unspecified organism: Secondary | ICD-10-CM

## 2016-07-19 DIAGNOSIS — J479 Bronchiectasis, uncomplicated: Secondary | ICD-10-CM | POA: Insufficient documentation

## 2016-07-19 DIAGNOSIS — R918 Other nonspecific abnormal finding of lung field: Secondary | ICD-10-CM | POA: Insufficient documentation

## 2016-07-19 DIAGNOSIS — E279 Disorder of adrenal gland, unspecified: Secondary | ICD-10-CM | POA: Insufficient documentation

## 2016-07-19 DIAGNOSIS — R938 Abnormal findings on diagnostic imaging of other specified body structures: Secondary | ICD-10-CM | POA: Diagnosis present

## 2016-07-19 DIAGNOSIS — R9389 Abnormal findings on diagnostic imaging of other specified body structures: Secondary | ICD-10-CM

## 2016-07-24 ENCOUNTER — Encounter: Payer: Self-pay | Admitting: Psychiatry

## 2016-07-24 ENCOUNTER — Ambulatory Visit (INDEPENDENT_AMBULATORY_CARE_PROVIDER_SITE_OTHER): Payer: 59 | Admitting: Psychiatry

## 2016-07-24 VITALS — BP 119/76 | HR 84 | Temp 98.4°F | Wt 136.0 lb

## 2016-07-24 DIAGNOSIS — F411 Generalized anxiety disorder: Secondary | ICD-10-CM

## 2016-07-24 DIAGNOSIS — F331 Major depressive disorder, recurrent, moderate: Secondary | ICD-10-CM

## 2016-07-24 MED ORDER — DULOXETINE HCL 30 MG PO CPEP: 30.0000 mg | ORAL_CAPSULE | Freq: Every day | ORAL | 3 refills | Status: DC

## 2016-07-24 MED ORDER — QUETIAPINE FUMARATE 25 MG PO TABS: 25.0000 mg | ORAL_TABLET | Freq: Every day | ORAL | 1 refills | Status: DC

## 2016-07-24 MED ORDER — ESCITALOPRAM OXALATE 10 MG PO TABS: 5.0000 mg | ORAL_TABLET | Freq: Every day | ORAL | 1 refills | Status: DC

## 2016-07-24 NOTE — Progress Notes (Signed)
Psychiatric MD Follow up Note   Patient Identification: Christine Vaughan MRN:  [id number] Date of Evaluation:  07/24/2016 Referral Source: PCP Chief Complaint:   Chief Complaint    Follow-up; Medication Refill     Visit Diagnosis:    ICD-9-CM ICD-10-CM   1. MDD (major depressive disorder), recurrent episode, moderate (HCC) 296.32 F33.1   2. GAD (generalized anxiety disorder) 300.02 F41.1     History of Present Illness:    Patient is a 83 year old female who presented for Follow-up. Patient reported that she has been feeling depressed and has been having some GI symptoms. She reported that she has started taking Carafate and it is not helping her. She is also taking omeprazole. She was discussing her medications in detail. Patient reported that she has been compliant with her medications. We discussed about changing her Lexapro and she is open to discussion. She currently denied having any suicidal homicidal ideations or plans.   Order that her family is supportive. She takes Seroquel at night to help her sleep. She is going to follow-up with a neurologist next month. Patient reported that her memory is fine and she was taking lorazepam on a regular basis because of her worsening anxiety. Patient currently denied having any suicidal ideations or plans. We advised patient to decrease the use of the Respimat this time. She agreed with the plan. She will be started on a new medication which she will gradually taper her medications.  She is able to eat well and is also drinking ensure  during the daytime.   Her daughter reported that she is helping her with the medications and she remains supportive. Patient currently denied having any suicidal ideations or plans. She denied having any perceptual disturbances.   Associated Signs/Symptoms: Depression Symptoms:  depressed mood, anhedonia, fatigue, difficulty concentrating, anxiety, disturbed sleep, (Hypo) Manic Symptoms:  none Anxiety  Symptoms:  Excessive Worry, Psychotic Symptoms:  none PTSD Symptoms: Negative NA  Past Psychiatric History:   Patient reported that she has tried some medications in the past but is unable to recall the names. She does not have any history of admission to a psychiatric hospital.  Previous Psychotropic Medications:  Unable to remember the names.  Substance Abuse History in the last 12 months:  No.  Consequences of Substance Abuse: Negative NA  Past Medical History:  Past Medical History:  Diagnosis Date  . Anxiety   . Chronic kidney disease   . Hereditary and idiopathic peripheral neuropathy 12/27/2015  . Hypertension   . Thyroid disease     Past Surgical History:  Procedure Laterality Date  . BACK SURGERY    . BREAST BIOPSY Left    surgical bx pt dose not remember  . BREAST EXCISIONAL BIOPSY Right 2001   + chem rad and armidex  . BREAST LUMPECTOMY    . BREAST SURGERY    . CHOLECYSTECTOMY    . ESOPHAGOGASTRODUODENOSCOPY (EGD) WITH PROPOFOL N/A 09/20/2015   Procedure: ESOPHAGOGASTRODUODENOSCOPY (EGD) WITH PROPOFOL;  Surgeon: Lollie Sails, MD;  Location: [facility] ENDOSCOPY;  Service: Endoscopy;  Laterality: N/A;    Family Psychiatric History: Depression in her mother. Family History:  Family History  Problem Relation Age of Onset  . Depression Mother   . Brain cancer Mother     Social History:   Social History   Social History  . Marital status: Married    Spouse name: N/A  . Number of children: 4  . Years of education: N/A   Occupational History  .  Retired    Social History Main Topics  . Smoking status: Never Smoker  . Smokeless tobacco: Never Used  . Alcohol use No  . Drug use: No  . Sexual activity: No   Other Topics Concern  . None   Social History Narrative   Lives at home w/ her husband   Right-hand   Caffeine: none    Additional Social History: She lives with her husband. She has 4 daughters and they are very supportive.  Allergies:    Allergies  Allergen Reactions  . Erythromycin Other (See Comments)    Gi distress  . Metronidazole     Other reaction(s): Abdominal Pain  . Sulfur Other (See Comments)    Doesn't remember  . Tizanidine Other (See Comments)    GI upset  . Penicillins Rash    Has patient had a PCN reaction causing immediate rash, facial/tongue/throat swelling, SOB or lightheadedness with hypotension: No Has patient had a PCN reaction causing severe rash involving mucus membranes or skin necrosis: No Has patient had a PCN reaction that required hospitalization No Has patient had a PCN reaction occurring within the last 10 years: no If all of the above answers are "NO", then may proceed with Cephalosporin use.  . Sulfamethoxazole Rash    all over body    Metabolic Disorder Labs: No results found for: HGBA1C, MPG No results found for: PROLACTIN No results found for: CHOL, TRIG, HDL, CHOLHDL, VLDL, LDLCALC   Current Medications: Current Outpatient Prescriptions  Medication Sig Dispense Refill  . acetaminophen (TYLENOL) 500 MG tablet Take 250 mg by mouth daily as needed.     [name] azelastine (ASTELIN) 0.1 % nasal spray Place into the nose 2 (two) times daily. Use in each nostril as directed    . escitalopram (LEXAPRO) 10 MG tablet Take 1 tablet (10 mg total) by mouth daily. 90 tablet 1  . levothyroxine (SYNTHROID, LEVOTHROID) 50 MCG tablet Take 50 mcg by mouth daily before breakfast.    . LORazepam (ATIVAN) 0.5 MG tablet Take 0.5 tablets (0.25 mg total) by mouth 2 (two) times daily. Change 1/2 pill prn - 15 tablet 0  . metoprolol (LOPRESSOR) 50 MG tablet Take 50 mg by mouth 2 (two) times daily.    . ondansetron (ZOFRAN) 4 MG tablet Take 1 tablet (4 mg total) by mouth every 8 (eight) hours as needed for nausea or vomiting. 20 tablet 1  . QUEtiapine (SEROQUEL) 25 MG tablet Take 1 tablet (25 mg total) by mouth at bedtime. 90 tablet 1  . RABEprazole (ACIPHEX) 20 MG tablet Take 20 mg by mouth 2 (two) times  daily.    . raloxifene (EVISTA) 60 MG tablet Take 60 mg by mouth daily.    . sucralfate (CARAFATE) 1 g tablet      No current facility-administered medications for this visit.     Neurologic: Headache: No Seizure: No Paresthesias:No  Musculoskeletal: Strength & Muscle Tone: decreased Gait & Station: normal Patient leans: N/A  Psychiatric Specialty Exam: ROS  Blood pressure 119/76, pulse 84, temperature 98.4 F (36.9 C), temperature source Oral, weight 136 lb (61.7 kg).Body mass index is 21.95 kg/m.  General Appearance: Casual  Eye Contact:  Fair  Speech:  Slow  Volume:  Normal  Mood:  Anxious  Affect:  Congruent  Thought Process:  Coherent and Goal Directed  Orientation:  Full (Time, Place, and Person)  Thought Content:  WDL  Suicidal Thoughts:  No  Homicidal Thoughts:  No  Memory:  Immediate;  Fair Recent;   Fair  Judgement:  Fair  Insight:  Fair  Psychomotor Activity:  Normal  Concentration:  Concentration: Fair and Attention Span: Fair  Recall:  [employer] of Knowledge:Fair  Language: Fair  Akathisia:  No  Handed:  Right  AIMS (if indicated):    Assets:  Communication Skills Desire for Improvement Physical Health Social Support  ADL's:  Intact  Cognition: WNL  Sleep:      Treatment Plan Summary: Medication management   Discussed with patient what the medications at length. I will adjust her medications as follows  Seroquel 25 mg at bedtime Ativan 0.25mg  PO BID -patient has supply and was prescribed by Dr. Ouida Sills. Advised patient to stop taking the lorazepam. She will start taking Lexapro 5  mg in the morning  She will be started on Cymbalta 30 mg daily from the next week and she agreed with the plan.    Advised her about the side effects of medication and she demonstrated understanding  Will follow up in4 weeks or earlier depending on her symptoms.  More than 50% of the time spent in psychoeducation, counseling and coordination of care.     This note was generated in part or whole with voice recognition software. Voice regonition is usually quite accurate but there are transcription errors that can and very often do occur. I apologize for any typographical errors that were not detected and corrected.    Rainey Pines, MD 5/30/20183:26 PM

## 2016-07-25 ENCOUNTER — Other Ambulatory Visit: Payer: Self-pay | Admitting: Internal Medicine

## 2016-07-25 DIAGNOSIS — R9389 Abnormal findings on diagnostic imaging of other specified body structures: Secondary | ICD-10-CM

## 2016-07-30 ENCOUNTER — Telehealth: Payer: Self-pay | Admitting: Neurology

## 2016-07-30 NOTE — Telephone Encounter (Signed)
Patient called office and scheduled an appointment with Dr. Jannifer Franklin for 08/12/16 @ 3:30pm.

## 2016-07-30 NOTE — Telephone Encounter (Signed)
Patient calling. She had a TB test last week and doctor could not determine if positive or negative.She had blood work done yesterday to tell if she has TB. She has appointment with Dr. Jannifer Franklin tomorrow at 3:30. Can she still come to appointment tomorrow and wear a mask or should she reschedule. Please call and advise.

## 2016-07-30 NOTE — Telephone Encounter (Signed)
Noted, thank you

## 2016-07-30 NOTE — Telephone Encounter (Signed)
Called pt. Offered appt on 08/12/16. She wants to speak with daughter first to see if she can bring her that day. She is going to call back and let me know what time works. Advised there is a 130pm or 330pm. She states 11am is too early.

## 2016-07-30 NOTE — Telephone Encounter (Signed)
I called patient. She does have an abnormal CT of the chest, she is actively coughing, probably best to postpone the revisit until the blood test becomes available.

## 2016-07-31 ENCOUNTER — Ambulatory Visit: Payer: Medicare Other | Admitting: Neurology

## 2016-08-12 ENCOUNTER — Ambulatory Visit (INDEPENDENT_AMBULATORY_CARE_PROVIDER_SITE_OTHER): Payer: Medicare Other | Admitting: Neurology

## 2016-08-12 ENCOUNTER — Encounter: Payer: Self-pay | Admitting: Neurology

## 2016-08-12 ENCOUNTER — Other Ambulatory Visit: Payer: Self-pay | Admitting: Neurology

## 2016-08-12 VITALS — BP 134/84 | HR 68 | Ht 66.0 in | Wt 135.0 lb

## 2016-08-12 DIAGNOSIS — G4489 Other headache syndrome: Secondary | ICD-10-CM

## 2016-08-12 DIAGNOSIS — G500 Trigeminal neuralgia: Secondary | ICD-10-CM | POA: Diagnosis not present

## 2016-08-12 DIAGNOSIS — R413 Other amnesia: Secondary | ICD-10-CM

## 2016-08-12 NOTE — Patient Instructions (Signed)
   Try stopping the Seroquel 25 mg at night for 1 week to see if this medication is causing your early morning drowsiness. If the drowsiness continues, please call our office.

## 2016-08-12 NOTE — Progress Notes (Signed)
Reason for visit: Headache  Christine Vaughan is an 83 y.o. female  History of present illness:  Christine Vaughan is an 83 year old right-handed white female with a history of trigeminal neuralgia the past in the right V2 distribution. The patient has been seen in November 2017 with a right parietal headache. The patient had noted an increase in severity the headaches and was seen for an evaluation. MRI of the brain was done, this was reviewed online and revealed evidence of a moderate level of periventricular white matter changes. The patient does not take aspirin on a daily basis. She indicates that her headaches have disappeared, she is no longer bothered with the headache. The patient does report some mild troubles with gait instability and some mild memory problems. The patient has not had any recent falls, she uses a cane for ambulation. She does report excessive drowsiness in the morning, this will wear off by afternoon and she does better at that time. The patient is concerned about the cause of the drowsiness. She returns to this office for an evaluation.  Past Medical History:  Diagnosis Date  . Anxiety   . Chronic kidney disease   . Hereditary and idiopathic peripheral neuropathy 12/27/2015  . Hypertension   . Thyroid disease     Past Surgical History:  Procedure Laterality Date  . BACK SURGERY    . BREAST BIOPSY Left    surgical bx pt dose not remember  . BREAST EXCISIONAL BIOPSY Right 2001   + chem rad and armidex  . BREAST LUMPECTOMY    . BREAST SURGERY    . CHOLECYSTECTOMY    . ESOPHAGOGASTRODUODENOSCOPY (EGD) WITH PROPOFOL N/A 09/20/2015   Procedure: ESOPHAGOGASTRODUODENOSCOPY (EGD) WITH PROPOFOL;  Surgeon: Lollie Sails, MD;  Location: [facility] ENDOSCOPY;  Service: Endoscopy;  Laterality: N/A;    Family History  Problem Relation Age of Onset  . Depression Mother   . Brain cancer Mother     Social history:  reports that she has never smoked. She has never used  smokeless tobacco. She reports that she does not drink alcohol or use drugs.    Allergies  Allergen Reactions  . Erythromycin Other (See Comments)    Gi distress  . Metronidazole     Other reaction(s): Abdominal Pain  . Sulfur Other (See Comments)    Doesn't remember  . Tizanidine Other (See Comments)    GI upset  . Penicillins Rash    Has patient had a PCN reaction causing immediate rash, facial/tongue/throat swelling, SOB or lightheadedness with hypotension: No Has patient had a PCN reaction causing severe rash involving mucus membranes or skin necrosis: No Has patient had a PCN reaction that required hospitalization No Has patient had a PCN reaction occurring within the last 10 years: no If all of the above answers are "NO", then may proceed with Cephalosporin use.  . Sulfamethoxazole Rash    all over body    Medications:  Prior to Admission medications   Medication Sig Start Date End Date Taking? Authorizing Provider  acetaminophen (TYLENOL) 500 MG tablet Take 250 mg by mouth daily as needed.    Yes [provider]  azelastine (ASTELIN) 0.1 % nasal spray Place into the nose 2 (two) times daily. Use in each nostril as directed   Yes [provider]  DULoxetine (CYMBALTA) 30 MG capsule Take 1 capsule (30 mg total) by mouth daily. 07/24/16  Yes Rainey Pines, MD  levothyroxine (SYNTHROID, LEVOTHROID) 50 MCG tablet Take 50  mcg by mouth daily before breakfast.   Yes [provider]  LORazepam (ATIVAN) 0.5 MG tablet Take 0.5 tablets (0.25 mg total) by mouth 2 (two) times daily. Change 1/2 pill prn - 09/25/15  Yes Rainey Pines, MD  Melatonin 3 MG TABS Take 1 tablet by mouth daily.   Yes [provider]  metoprolol (LOPRESSOR) 50 MG tablet Take 50 mg by mouth 2 (two) times daily.   Yes [provider]  ondansetron (ZOFRAN) 4 MG tablet Take 1 tablet (4 mg total) by mouth every 8 (eight) hours as needed for nausea or vomiting. 09/13/15 09/12/16 Yes  Alfred Levins, [place], MD  QUEtiapine (SEROQUEL) 25 MG tablet Take 1 tablet (25 mg total) by mouth at bedtime. 07/24/16  Yes Rainey Pines, MD  RABEprazole (ACIPHEX) 20 MG tablet Take 20 mg by mouth 2 (two) times daily.   Yes [provider]  raloxifene (EVISTA) 60 MG tablet Take 60 mg by mouth daily.   Yes [provider]  sucralfate (CARAFATE) 1 g tablet  12/07/15  Yes [provider]    ROS:  Out of a complete 14 system review of symptoms, the patient complains only of the following symptoms, and all other reviewed systems are negative.  Decreased activity, appetite change, fatigue Hearing loss, runny nose Cough, shortness of breath, chest tightness Cold intolerance Abdominal pain, constipation, diarrhea, nausea Daytime sleepiness, sleep talking Difficulty urinating, incontinence of the bladder, frequency of urination, urinary decreased Walking difficulty Skin rash Memory loss, speech difficulty Depression, anxiety, suicidal thoughts  Blood pressure 134/84, pulse 68, height 5\' 6"  (1.676 m), weight 135 lb (61.2 kg).  Physical Exam  General: The patient is alert and cooperative at the time of the examination.  Skin: No significant peripheral edema is noted.   Neurologic Exam  Mental status: The patient is alert and oriented x 3 at the time of the examination. The patient has apparent normal recent and remote memory, with an apparently normal attention span and concentration ability. Mini-Mental Status Examination done today shows a total score of 29/30.   Cranial nerves: Facial symmetry is present. Speech is normal, no aphasia or dysarthria is noted. Extraocular movements are full. Visual fields are full.  Motor: The patient has good strength in all 4 extremities.  Sensory examination: Soft touch sensation is symmetric on the face, arms, and legs, but she reports the sensation is decreased below the knees.  Coordination: The patient has good  finger-nose-finger and heel-to-shin bilaterally.  Gait and station: The patient has a wide-based gait, the patient walks with a cane. Tandem gait is very unsteady. Romberg is negative, but is unsteady. No drift is seen.  Reflexes: Deep tendon reflexes are symmetric.   MRI brain 01/11/16:  IMPRESSION: This MRI of the brain without contrast shows the following: 1.  There are extensive T2/FLAIR hyperintense foci predominantly in the deep and periventricular white matter of the hemispheres. There are milder changes in the pons. These are most consistent with chronic microvascular ischemic change though demyelination cannot be completely ruled out.  None of the foci appears to be acute and there is no significant progression when compared to the MRI dated 07/03/2012. 2.  Brain volume is normal for age. 3.  There are no acute findings.  * MRI scan images were reviewed online. I agree with the written report.    Assessment/Plan:  1. History of headache, resolved  2. Mild memory disturbance  3. Gait disturbance  The patient has had resolution of her  headache. The patient is having some drowsiness in the morning, she is to stop her Seroquel in the evening for least one week to see if this was the etiology of her drowsiness. If not, she is to contact our office and we may consider a sleep referral.  Jill Alexanders MD 08/12/2016 4:08 PM  [facility] [address] [place], [place] [address]  Phone [phone] Fax [phone]

## 2016-08-14 ENCOUNTER — Ambulatory Visit (INDEPENDENT_AMBULATORY_CARE_PROVIDER_SITE_OTHER): Payer: 59 | Admitting: Psychiatry

## 2016-08-14 DIAGNOSIS — F411 Generalized anxiety disorder: Secondary | ICD-10-CM

## 2016-08-14 DIAGNOSIS — F331 Major depressive disorder, recurrent, moderate: Secondary | ICD-10-CM | POA: Diagnosis not present

## 2016-08-14 MED ORDER — LORAZEPAM 0.5 MG PO TABS: ORAL_TABLET | ORAL | 2 refills | Status: DC

## 2016-08-14 MED ORDER — DULOXETINE HCL 30 MG PO CPEP: 30.0000 mg | ORAL_CAPSULE | Freq: Every day | ORAL | 3 refills | Status: DC

## 2016-08-14 NOTE — Progress Notes (Signed)
Psychiatric MD Follow up Note   Patient Identification: Christine Vaughan MRN:  [id number] Date of Evaluation:  08/14/2016 Referral Source: PCP Chief Complaint:    Visit Diagnosis:    ICD-10-CM   1. MDD (major depressive disorder), recurrent episode, moderate (HCC) F33.1   2. GAD (generalized anxiety disorder) F41.1     History of Present Illness:    Patient is a 83 year old female who presented for Follow-up. Patient reported that she Was recently evaluated by the neurologist who has also advised her to stop taking the Seroquel due to her increased sleepiness. We have also discussed about stopping the lorazepam and decreasing the dose of Seroquel. Patient reported that she feels sleepy during the daytime. She brought her medications. She stated that she has cut down on the lorazepam to half a pill at night. She also takes melatonin at night. Discussed with patient about her medications. She agreed with the plan. She reported that she has been compliant with her medications. She has an appointment Dr. Ouida Sills next week. She currently denied having any suicidal homicidal ideations or plans. She appears pleasant and cooperative during the interview.  Has done to rule out her cough and chest pain that there is no results she still has a cough.   Her family is very supportive. She currently denied having any suicidal ideations or plans. She reported that she sleeps well at night. She is planning to decrease the dose of her lorazepam to half a pill at night and will stop the Seroquel and melatonin.       She is able to eat well and is also drinking ensure  during the daytime.     Associated Signs/Symptoms: Depression Symptoms:  depressed mood, fatigue, anxiety, (Hypo) Manic Symptoms:  none Anxiety Symptoms:  Excessive Worry, Psychotic Symptoms:  none PTSD Symptoms: Negative NA  Past Psychiatric History:   Patient reported that she has tried some medications in the past but is  unable to recall the names. She does not have any history of admission to a psychiatric hospital.  Previous Psychotropic Medications:  Unable to remember the names.  Substance Abuse History in the last 12 months:  No.  Consequences of Substance Abuse: Negative NA  Past Medical History:  Past Medical History:  Diagnosis Date  . Anxiety   . Chronic kidney disease   . Hereditary and idiopathic peripheral neuropathy 12/27/2015  . Hypertension   . Thyroid disease     Past Surgical History:  Procedure Laterality Date  . BACK SURGERY    . BREAST BIOPSY Left    surgical bx pt dose not remember  . BREAST EXCISIONAL BIOPSY Right 2001   + chem rad and armidex  . BREAST LUMPECTOMY    . BREAST SURGERY    . CHOLECYSTECTOMY    . ESOPHAGOGASTRODUODENOSCOPY (EGD) WITH PROPOFOL N/A 09/20/2015   Procedure: ESOPHAGOGASTRODUODENOSCOPY (EGD) WITH PROPOFOL;  Surgeon: Lollie Sails, MD;  Location: [facility] ENDOSCOPY;  Service: Endoscopy;  Laterality: N/A;    Family Psychiatric History: Depression in her mother. Family History:  Family History  Problem Relation Age of Onset  . Depression Mother   . Brain cancer Mother     Social History:   Social History   Social History  . Marital status: Married    Spouse name: N/A  . Number of children: 4  . Years of education: N/A   Occupational History  . Retired    Social History Main Topics  . Smoking status: Never Smoker  .  Smokeless tobacco: Never Used  . Alcohol use No  . Drug use: No  . Sexual activity: No   Other Topics Concern  . Not on file   Social History Narrative   Lives at home w/ her husband   Right-hand   Caffeine: none    Additional Social History: She lives with her husband. She has 4 daughters and they are very supportive.  Allergies:   Allergies  Allergen Reactions  . Erythromycin Other (See Comments)    Gi distress  . Metronidazole     Other reaction(s): Abdominal Pain  . Sulfur Other (See Comments)     Doesn't remember  . Tizanidine Other (See Comments)    GI upset  . Penicillins Rash    Has patient had a PCN reaction causing immediate rash, facial/tongue/throat swelling, SOB or lightheadedness with hypotension: No Has patient had a PCN reaction causing severe rash involving mucus membranes or skin necrosis: No Has patient had a PCN reaction that required hospitalization No Has patient had a PCN reaction occurring within the last 10 years: no If all of the above answers are "NO", then may proceed with Cephalosporin use.  . Sulfamethoxazole Rash    all over body    Metabolic Disorder Labs: No results found for: HGBA1C, MPG No results found for: PROLACTIN No results found for: CHOL, TRIG, HDL, CHOLHDL, VLDL, LDLCALC   Current Medications: Current Outpatient Prescriptions  Medication Sig Dispense Refill  . acetaminophen (TYLENOL) 500 MG tablet Take 250 mg by mouth daily as needed.     [name] azelastine (ASTELIN) 0.1 % nasal spray Place into the nose 2 (two) times daily. Use in each nostril as directed    . DULoxetine (CYMBALTA) 30 MG capsule Take 1 capsule (30 mg total) by mouth daily. 30 capsule 3  . levothyroxine (SYNTHROID, LEVOTHROID) 50 MCG tablet Take 50 mcg by mouth daily before breakfast.    . LORazepam (ATIVAN) 0.5 MG tablet 1/2 pill daily 15 tablet 2  . metoprolol (LOPRESSOR) 50 MG tablet Take 50 mg by mouth 2 (two) times daily.    . ondansetron (ZOFRAN) 4 MG tablet Take 1 tablet (4 mg total) by mouth every 8 (eight) hours as needed for nausea or vomiting. 20 tablet 1  . RABEprazole (ACIPHEX) 20 MG tablet Take 20 mg by mouth 2 (two) times daily.    . raloxifene (EVISTA) 60 MG tablet Take 60 mg by mouth daily.    . sucralfate (CARAFATE) 1 g tablet      No current facility-administered medications for this visit.     Neurologic: Headache: No Seizure: No Paresthesias:No  Musculoskeletal: Strength & Muscle Tone: decreased Gait & Station: normal Patient leans:  N/A  Psychiatric Specialty Exam: ROS  There were no vitals taken for this visit.There is no height or weight on file to calculate BMI.  General Appearance: Casual  Eye Contact:  Fair  Speech:  Slow  Volume:  Normal  Mood:  Anxious  Affect:  Congruent  Thought Process:  Coherent and Goal Directed  Orientation:  Full (Time, Place, and Person)  Thought Content:  WDL  Suicidal Thoughts:  No  Homicidal Thoughts:  No  Memory:  Immediate;   Fair Recent;   Fair  Judgement:  Fair  Insight:  Fair  Psychomotor Activity:  Normal  Concentration:  Concentration: Fair and Attention Span: Fair  Recall:  [employer] of Knowledge:Fair  Language: Fair  Akathisia:  No  Handed:  Right  AIMS (if indicated):  Assets:  Communication Skills Desire for Improvement Physical Health Social Support  ADL's:  Intact  Cognition: WNL  Sleep:      Treatment Plan Summary: Medication management   Discussed with patient what the medications at length. I will adjust her medications as follows  D/c seroquel.  Ativan 0.25mg  po qhs    She will be started on Cymbalta 30 mg daily from the next week and she agreed with the plan.    Advised her about the side effects of medication and she demonstrated understanding  Will follow up in4 weeks or earlier depending on her symptoms.  More than 50% of the time spent in psychoeducation, counseling and coordination of care.    This note was generated in part or whole with voice recognition software. Voice regonition is usually quite accurate but there are transcription errors that can and very often do occur. I apologize for any typographical errors that were not detected and corrected.    Rainey Pines, MD 6/20/20183:37 PM

## 2016-09-16 ENCOUNTER — Telehealth: Payer: Self-pay

## 2016-09-16 NOTE — Telephone Encounter (Signed)
Medication problem - Telephone call with patient hoping she would catch Dr. Gretel Acre today as she states her depression has not inproved with Cymbalta. Pt denies any current suicidal or homicidal ideations and questioned if she needed a medication change.  Assisted patient with rescheduling for 09/30/16 at 11:15am for Dr. Anola Gurney first available appointment and agreed to send a message to her to see if she would want to change anything before seeing patient.  Discussed with patient if any symptoms worsened she could come into the emergency department for an evaluation and patient agreed with plan.  Again, patient denied any current suicidal ideations but does not think Cymbalta is working as well as desired.

## 2016-09-17 NOTE — Telephone Encounter (Signed)
Will defer to Dr. Faheem

## 2016-09-30 ENCOUNTER — Ambulatory Visit: Payer: 59 | Admitting: Psychiatry

## 2016-09-30 ENCOUNTER — Ambulatory Visit (INDEPENDENT_AMBULATORY_CARE_PROVIDER_SITE_OTHER): Payer: 59 | Admitting: Psychiatry

## 2016-09-30 ENCOUNTER — Encounter: Payer: Self-pay | Admitting: Psychiatry

## 2016-09-30 VITALS — Temp 98.7°F

## 2016-09-30 DIAGNOSIS — F331 Major depressive disorder, recurrent, moderate: Secondary | ICD-10-CM

## 2016-09-30 MED ORDER — LORAZEPAM 0.5 MG PO TABS: ORAL_TABLET | ORAL | 1 refills | Status: DC

## 2016-09-30 MED ORDER — DULOXETINE HCL 60 MG PO CPEP: 60.0000 mg | ORAL_CAPSULE | Freq: Every day | ORAL | 1 refills | Status: DC

## 2016-09-30 NOTE — Progress Notes (Signed)
Psychiatric MD Follow up Note   Patient Identification: Christine Vaughan MRN:  [id number] Date of Evaluation:  09/30/2016 Referral Source: PCP Chief Complaint:    Visit Diagnosis:    ICD-10-CM   1. MDD (major depressive disorder), recurrent episode, moderate (HCC) F33.1     History of Present Illness:    Patient is a 83 year old female who presented for Follow-up. Patient reported that she Continues to feel anxious and has been taking lorazepam half a pill in the morning and half a pill at night. She was advised to take only half a pill of lorazepam on a daily basis and her dose was decreased at her last appointment. However she continues to remain focused on the lorazepam. She called the office as she missed her last appointment. She reported that she has been taking Cymbalta for 1 month and has not noticed any difference. She was also talking about the Xanax as she has taken for 7 years in the past when she was in her 29s. Patient reported that she feels apprehensive as she has been dependent on the benzodiazepines. We discussed about decreasing the dose of benzodiazepines as it increases the risk of  falls in the elderly patients.  Patient was advised that she will not be given higher dose of lorazepam and she will only be taking only half a pill on a daily basis. She is still has lorazepam pills available and advised her to take only half a pill daily and she will have her next prescription filled in a timely fashion. She continues to ramble throughout the interview.   She is able to eat well and is also drinking ensure  during the daytime.     Associated Signs/Symptoms: Depression Symptoms:  depressed mood, fatigue, anxiety, (Hypo) Manic Symptoms:  none Anxiety Symptoms:  Excessive Worry, Psychotic Symptoms:  none PTSD Symptoms: Negative NA  Past Psychiatric History:   Patient reported that she has tried some medications in the past but is unable to recall the names. She does  not have any history of admission to a psychiatric hospital.  Previous Psychotropic Medications:  Unable to remember the names.  Substance Abuse History in the last 12 months:  No.  Consequences of Substance Abuse: Negative NA  Past Medical History:  Past Medical History:  Diagnosis Date  . Anxiety   . Chronic kidney disease   . Hereditary and idiopathic peripheral neuropathy 12/27/2015  . Hypertension   . Thyroid disease     Past Surgical History:  Procedure Laterality Date  . BACK SURGERY    . BREAST BIOPSY Left    surgical bx pt dose not remember  . BREAST EXCISIONAL BIOPSY Right 2001   + chem rad and armidex  . BREAST LUMPECTOMY    . BREAST SURGERY    . CHOLECYSTECTOMY    . ESOPHAGOGASTRODUODENOSCOPY (EGD) WITH PROPOFOL N/A 09/20/2015   Procedure: ESOPHAGOGASTRODUODENOSCOPY (EGD) WITH PROPOFOL;  Surgeon: Lollie Sails, MD;  Location: [facility] ENDOSCOPY;  Service: Endoscopy;  Laterality: N/A;    Family Psychiatric History: Depression in her mother. Family History:  Family History  Problem Relation Age of Onset  . Depression Mother   . Brain cancer Mother     Social History:   Social History   Social History  . Marital status: Married    Spouse name: N/A  . Number of children: 4  . Years of education: N/A   Occupational History  . Retired    Social History Main Topics  . Smoking  status: Never Smoker  . Smokeless tobacco: Never Used  . Alcohol use No  . Drug use: No  . Sexual activity: No   Other Topics Concern  . Not on file   Social History Narrative   Lives at home w/ her husband   Right-hand   Caffeine: none    Additional Social History: She lives with her husband. She has 4 daughters and they are very supportive.  Allergies:   Allergies  Allergen Reactions  . Erythromycin Other (See Comments)    Gi distress  . Metronidazole     Other reaction(s): Abdominal Pain  . Sulfur Other (See Comments)    Doesn't remember  . Tizanidine Other  (See Comments)    GI upset  . Penicillins Rash    Has patient had a PCN reaction causing immediate rash, facial/tongue/throat swelling, SOB or lightheadedness with hypotension: No Has patient had a PCN reaction causing severe rash involving mucus membranes or skin necrosis: No Has patient had a PCN reaction that required hospitalization No Has patient had a PCN reaction occurring within the last 10 years: no If all of the above answers are "NO", then may proceed with Cephalosporin use.  . Sulfamethoxazole Rash    all over body    Metabolic Disorder Labs: No results found for: HGBA1C, MPG No results found for: PROLACTIN No results found for: CHOL, TRIG, HDL, CHOLHDL, VLDL, LDLCALC   Current Medications: Current Outpatient Prescriptions  Medication Sig Dispense Refill  . acetaminophen (TYLENOL) 500 MG tablet Take 250 mg by mouth daily as needed.     [name] azelastine (ASTELIN) 0.1 % nasal spray Place into the nose 2 (two) times daily. Use in each nostril as directed    . DULoxetine (CYMBALTA) 30 MG capsule Take 1 capsule (30 mg total) by mouth daily. 30 capsule 3  . levothyroxine (SYNTHROID, LEVOTHROID) 50 MCG tablet Take 50 mcg by mouth daily before breakfast.    . LORazepam (ATIVAN) 0.5 MG tablet 1/2 pill daily 15 tablet 2  . metoprolol (LOPRESSOR) 50 MG tablet Take 50 mg by mouth 2 (two) times daily.    . RABEprazole (ACIPHEX) 20 MG tablet Take 20 mg by mouth 2 (two) times daily.    . raloxifene (EVISTA) 60 MG tablet Take 60 mg by mouth daily.    . sucralfate (CARAFATE) 1 g tablet      No current facility-administered medications for this visit.     Neurologic: Headache: No Seizure: No Paresthesias:No  Musculoskeletal: Strength & Muscle Tone: decreased Gait & Station: normal Patient leans: N/A  Psychiatric Specialty Exam: ROS  There were no vitals taken for this visit.There is no height or weight on file to calculate BMI.  General Appearance: Casual  Eye Contact:  Fair   Speech:  Slow  Volume:  Normal  Mood:  Anxious  Affect:  Congruent  Thought Process:  Coherent and Goal Directed  Orientation:  Full (Time, Place, and Person)  Thought Content:  WDL  Suicidal Thoughts:  No  Homicidal Thoughts:  No  Memory:  Immediate;   Fair Recent;   Fair  Judgement:  Fair  Insight:  Fair  Psychomotor Activity:  Normal  Concentration:  Concentration: Fair and Attention Span: Fair  Recall:  [employer] of Knowledge:Fair  Language: Fair  Akathisia:  No  Handed:  Right  AIMS (if indicated):    Assets:  Communication Skills Desire for Improvement Physical Health Social Support  ADL's:  Intact  Cognition: WNL  Sleep:  Treatment Plan Summary: Medication management   Discussed with patient what the medications at length. I will adjust her medications as follows  Ativan 0.25mg  po qhs -Prescription given and she will fill it on August 20 with one more refill   She will be started on Cymbalta 60 mg daily  Advised her about the side effects of medication and she demonstrated understanding  Will follow up in 4 weeks or earlier depending on her symptoms.  More than 50% of the time spent in psychoeducation, counseling and coordination of care.    This note was generated in part or whole with voice recognition software. Voice regonition is usually quite accurate but there are transcription errors that can and very often do occur. I apologize for any typographical errors that were not detected and corrected.    Rainey Pines, MD 8/6/20181:33 PM

## 2016-10-01 ENCOUNTER — Ambulatory Visit: Payer: 59 | Admitting: Psychiatry

## 2016-10-21 ENCOUNTER — Encounter: Payer: Self-pay | Admitting: Psychiatry

## 2016-10-21 ENCOUNTER — Ambulatory Visit (INDEPENDENT_AMBULATORY_CARE_PROVIDER_SITE_OTHER): Payer: 59 | Admitting: Psychiatry

## 2016-10-21 VITALS — BP 137/69 | HR 72 | Wt 130.2 lb

## 2016-10-21 DIAGNOSIS — F411 Generalized anxiety disorder: Secondary | ICD-10-CM | POA: Diagnosis not present

## 2016-10-21 DIAGNOSIS — F331 Major depressive disorder, recurrent, moderate: Secondary | ICD-10-CM | POA: Diagnosis not present

## 2016-10-21 MED ORDER — DULOXETINE HCL 30 MG PO CPEP: 30.0000 mg | ORAL_CAPSULE | Freq: Every day | ORAL | 2 refills | Status: DC

## 2016-10-21 NOTE — Progress Notes (Signed)
Psychiatric MD Follow up Note   Patient Identification: Christine Vaughan MRN:  [id number] Date of Evaluation:  10/21/2016 Referral Source: PCP Chief Complaint:   Chief Complaint    Follow-up; Medication Refill     Visit Diagnosis:    ICD-10-CM   1. MDD (major depressive disorder), recurrent episode, moderate (HCC) F33.1   2. GAD (generalized anxiety disorder) F41.1     History of Present Illness:    Patient is a 83 year old female who presented for Follow-up Accompanied by her daughter. She continues to remain apprehensive and anxious. She has not filled the prescription of lorazepam which was given at her last appointment. She reported that she went to her doctor who has given her Toradol  for pain. She does not want to add it together with  lorazepam  She remains focused on her medications. Her daughter reported that she puts her pills in the pillbox but then she has to take the medications from the bottles. She has very anxious personality. She was focused on her medications. Her daughter reported that she has poor sleep at night. We discussed about eating something before going to bed. She is going to have a chest CT scan next week. Patient denied having any suicidal ideations or plans. Her family remains supportive.        Associated Signs/Symptoms: Depression Symptoms:  depressed mood, fatigue, anxiety, (Hypo) Manic Symptoms:  none Anxiety Symptoms:  Excessive Worry, Psychotic Symptoms:  none PTSD Symptoms: Negative NA  Past Psychiatric History:   Patient reported that she has tried some medications in the past but is unable to recall the names. She does not have any history of admission to a psychiatric hospital.  Previous Psychotropic Medications:  Unable to remember the names.  Substance Abuse History in the last 12 months:  No.  Consequences of Substance Abuse: Negative NA  Past Medical History:  Past Medical History:  Diagnosis Date  . Anxiety   .  Chronic kidney disease   . Hereditary and idiopathic peripheral neuropathy 12/27/2015  . Hypertension   . Thyroid disease     Past Surgical History:  Procedure Laterality Date  . BACK SURGERY    . BREAST BIOPSY Left    surgical bx pt dose not remember  . BREAST EXCISIONAL BIOPSY Right 2001   + chem rad and armidex  . BREAST LUMPECTOMY    . BREAST SURGERY    . CHOLECYSTECTOMY    . ESOPHAGOGASTRODUODENOSCOPY (EGD) WITH PROPOFOL N/A 09/20/2015   Procedure: ESOPHAGOGASTRODUODENOSCOPY (EGD) WITH PROPOFOL;  Surgeon: Lollie Sails, MD;  Location: [facility] ENDOSCOPY;  Service: Endoscopy;  Laterality: N/A;    Family Psychiatric History: Depression in her mother. Family History:  Family History  Problem Relation Age of Onset  . Depression Mother   . Brain cancer Mother     Social History:   Social History   Social History  . Marital status: Married    Spouse name: N/A  . Number of children: 4  . Years of education: N/A   Occupational History  . Retired    Social History Main Topics  . Smoking status: Never Smoker  . Smokeless tobacco: Never Used  . Alcohol use No  . Drug use: No  . Sexual activity: No   Other Topics Concern  . None   Social History Narrative   Lives at home w/ her husband   Right-hand   Caffeine: none    Additional Social History: She lives with her husband. She has 4  daughters and they are very supportive.  Allergies:   Allergies  Allergen Reactions  . Erythromycin Other (See Comments)    Gi distress  . Metronidazole     Other reaction(s): Abdominal Pain  . Sulfur Other (See Comments)    Doesn't remember  . Tizanidine Other (See Comments)    GI upset  . Penicillins Rash    Has patient had a PCN reaction causing immediate rash, facial/tongue/throat swelling, SOB or lightheadedness with hypotension: No Has patient had a PCN reaction causing severe rash involving mucus membranes or skin necrosis: No Has patient had a PCN reaction that required  hospitalization No Has patient had a PCN reaction occurring within the last 10 years: no If all of the above answers are "NO", then may proceed with Cephalosporin use.  . Sulfamethoxazole Rash    all over body    Metabolic Disorder Labs: No results found for: HGBA1C, MPG No results found for: PROLACTIN No results found for: CHOL, TRIG, HDL, CHOLHDL, VLDL, LDLCALC   Current Medications: Current Outpatient Prescriptions  Medication Sig Dispense Refill  . acetaminophen (TYLENOL) 500 MG tablet Take 250 mg by mouth daily as needed.     [name] azelastine (ASTELIN) 0.1 % nasal spray Place into the nose 2 (two) times daily. Use in each nostril as directed    . gabapentin (NEURONTIN) 300 MG capsule Take by mouth.    . levothyroxine (SYNTHROID, LEVOTHROID) 50 MCG tablet Take 50 mcg by mouth daily before breakfast.    . metoprolol (LOPRESSOR) 50 MG tablet Take 50 mg by mouth 2 (two) times daily.    . RABEprazole (ACIPHEX) 20 MG tablet Take 20 mg by mouth 2 (two) times daily.    . raloxifene (EVISTA) 60 MG tablet Take 60 mg by mouth daily.    . sucralfate (CARAFATE) 1 g tablet     . DULoxetine (CYMBALTA) 30 MG capsule Take 1 capsule (30 mg total) by mouth daily. 30 capsule 2   No current facility-administered medications for this visit.     Neurologic: Headache: No Seizure: No Paresthesias:No  Musculoskeletal: Strength & Muscle Tone: decreased Gait & Station: normal Patient leans: N/A  Psychiatric Specialty Exam: ROS  Blood pressure 137/69, pulse 72, weight 130 lb 3.2 oz (59.1 kg).Body mass index is 21.01 kg/m.  General Appearance: Casual  Eye Contact:  Fair  Speech:  Slow  Volume:  Normal  Mood:  Anxious  Affect:  Congruent  Thought Process:  Coherent and Goal Directed  Orientation:  Full (Time, Place, and Person)  Thought Content:  WDL  Suicidal Thoughts:  No  Homicidal Thoughts:  No  Memory:  Immediate;   Fair Recent;   Fair  Judgement:  Fair  Insight:  Fair  Psychomotor  Activity:  Normal  Concentration:  Concentration: Fair and Attention Span: Fair  Recall:  [employer] of Knowledge:Fair  Language: Fair  Akathisia:  No  Handed:  Right  AIMS (if indicated):    Assets:  Communication Skills Desire for Improvement Physical Health Social Support  ADL's:  Intact  Cognition: WNL  Sleep:      Treatment Plan Summary: Medication management   Discussed with patient what the medications at length. I will adjust her medications as follows  Ativan 0.25mg  po qhs -Prescription Was given at her last appointment and she has not filled the prescription.   She will be started on Cymbalta 30mg  as she could not tolerate the 60 mg dose. Advised her about the side effects of  medication and she demonstrated understanding  Will follow up in 2 months or earlier depending on her symptoms.  More than 50% of the time spent in psychoeducation, counseling and coordination of care.    This note was generated in part or whole with voice recognition software. Voice regonition is usually quite accurate but there are transcription errors that can and very often do occur. I apologize for any typographical errors that were not detected and corrected.    Rainey Pines, MD 8/27/20182:43 PM

## 2016-10-30 ENCOUNTER — Ambulatory Visit: Payer: Medicare Other

## 2016-10-31 ENCOUNTER — Ambulatory Visit
Admission: RE | Admit: 2016-10-31 | Discharge: 2016-10-31 | Disposition: A | Discharge status: Home / Self Care | Payer: Medicare Other | Source: Ambulatory Visit | Attending: Internal Medicine | Admitting: Internal Medicine

## 2016-10-31 DIAGNOSIS — R938 Abnormal findings on diagnostic imaging of other specified body structures: Secondary | ICD-10-CM | POA: Insufficient documentation

## 2016-10-31 DIAGNOSIS — I700 Atherosclerosis of aorta: Secondary | ICD-10-CM | POA: Diagnosis not present

## 2016-10-31 DIAGNOSIS — R9389 Abnormal findings on diagnostic imaging of other specified body structures: Secondary | ICD-10-CM

## 2016-12-16 ENCOUNTER — Ambulatory Visit: Payer: 59 | Admitting: Psychiatry

## 2016-12-23 ENCOUNTER — Ambulatory Visit: Payer: 59 | Admitting: Psychiatry

## 2016-12-30 ENCOUNTER — Other Ambulatory Visit: Payer: Self-pay

## 2016-12-30 NOTE — Telephone Encounter (Signed)
pt called states that she does not have enough medication to get to her next appt with dr. Gretel Acre.  pt was last seen on  10-21-16 next appt  01-20-17 pt needs refills sent.

## 2016-12-31 ENCOUNTER — Other Ambulatory Visit: Payer: Self-pay | Admitting: Psychiatry

## 2016-12-31 DIAGNOSIS — F330 Major depressive disorder, recurrent, mild: Secondary | ICD-10-CM

## 2016-12-31 MED ORDER — DULOXETINE HCL 30 MG PO CPEP: 30.0000 mg | ORAL_CAPSULE | Freq: Every day | ORAL | 2 refills | Status: DC

## 2016-12-31 NOTE — Telephone Encounter (Signed)
cymbalta script sent to pharmacy for 30 days plus 2 refills .

## 2016-12-31 NOTE — Telephone Encounter (Signed)
cymbalta script sent

## 2017-01-10 ENCOUNTER — Ambulatory Visit: Admission: RE | Admit: 2017-01-10 | Payer: Medicare Other | Source: Ambulatory Visit | Admitting: Internal Medicine

## 2017-01-10 ENCOUNTER — Encounter: Admission: RE | Payer: Self-pay | Source: Ambulatory Visit

## 2017-01-10 SURGERY — ESOPHAGOGASTRODUODENOSCOPY (EGD) WITH PROPOFOL
Anesthesia: General

## 2017-01-20 ENCOUNTER — Encounter: Payer: Self-pay | Admitting: Psychiatry

## 2017-01-20 ENCOUNTER — Ambulatory Visit (INDEPENDENT_AMBULATORY_CARE_PROVIDER_SITE_OTHER): Payer: Medicare Other | Admitting: Psychiatry

## 2017-01-20 DIAGNOSIS — F411 Generalized anxiety disorder: Secondary | ICD-10-CM

## 2017-01-20 DIAGNOSIS — F330 Major depressive disorder, recurrent, mild: Secondary | ICD-10-CM

## 2017-01-20 MED ORDER — LORAZEPAM 0.5 MG PO TABS: ORAL_TABLET | ORAL | 2 refills | Status: DC

## 2017-01-20 MED ORDER — DULOXETINE HCL 30 MG PO CPEP: 30.0000 mg | ORAL_CAPSULE | Freq: Every day | ORAL | 2 refills | Status: DC

## 2017-01-20 NOTE — Progress Notes (Signed)
Psychiatric MD Follow up Note   Patient Identification: Christine Vaughan MRN:  [id number] Date of Evaluation:  01/20/2017 Referral Source: PCP Chief Complaint:    Visit Diagnosis:    ICD-10-CM   1. GAD (generalized anxiety disorder) F41.1   2. Mild episode of recurrent major depressive disorder (HCC) F33.0 DULoxetine (CYMBALTA) 30 MG capsule    History of Present Illness:    Patient is a 83 year old female who presented for follow-up  She reported that she has been doing well on the Cymbalta and the medication has been working well for her. She was happy and excited during the interview. She reported that she is unable to tolerate the 60 mg dose but the 30 mg is working well for her. She takes lorazepam only on the when necessary basis. She stated that she feels energetic and her anxiety is improving. She denied having any side effects of the medication. She was discussing in detail about her daughter who was sick and in the hospital and her brothers daughters are helping her. She reported that her sleep is also improving and she only takes the lorazepam on a when necessary basis. She reported that she wants a refill on the medication as she might run out before her next appointment. She denied having any perceptual disturbances. She denied having any suicidal homicidal ideations or plans.    Patient was diagnosed with MAI pneumonia and she is currently taking medications for the same. She reported that she has several testing done at the pulmonologist office and they have finally diagnosed her and she has been ruled out for TB.      Associated Signs/Symptoms: Depression Symptoms:  depressed mood, fatigue, anxiety, (Hypo) Manic Symptoms:  none Anxiety Symptoms:  Excessive Worry, Psychotic Symptoms:  none PTSD Symptoms: Negative NA  Past Psychiatric History:   Patient reported that she has tried some medications in the past but is unable to recall the names. She does not have  any history of admission to a psychiatric hospital.  Previous Psychotropic Medications:  Unable to remember the names.  Substance Abuse History in the last 12 months:  No.  Consequences of Substance Abuse: Negative NA  Past Medical History:  Past Medical History:  Diagnosis Date  . Anxiety   . Chronic kidney disease   . Hereditary and idiopathic peripheral neuropathy 12/27/2015  . Hypertension   . Thyroid disease     Past Surgical History:  Procedure Laterality Date  . BACK SURGERY    . BREAST BIOPSY Left    surgical bx pt dose not remember  . BREAST EXCISIONAL BIOPSY Right 2001   + chem rad and armidex  . BREAST LUMPECTOMY    . BREAST SURGERY    . CHOLECYSTECTOMY    . ESOPHAGOGASTRODUODENOSCOPY (EGD) WITH PROPOFOL N/A 09/20/2015   Procedure: ESOPHAGOGASTRODUODENOSCOPY (EGD) WITH PROPOFOL;  Surgeon: Lollie Sails, MD;  Location: [facility] ENDOSCOPY;  Service: Endoscopy;  Laterality: N/A;    Family Psychiatric History: Depression in her mother. Family History:  Family History  Problem Relation Age of Onset  . Depression Mother   . Brain cancer Mother     Social History:   Social History   Socioeconomic History  . Marital status: Married    Spouse name: Not on file  . Number of children: 4  . Years of education: Not on file  . Highest education level: Not on file  Social Needs  . Financial resource strain: Not on file  . Food insecurity - worry:  Not on file  . Food insecurity - inability: Not on file  . Transportation needs - medical: Not on file  . Transportation needs - non-medical: Not on file  Occupational History  . Occupation: Retired  Tobacco Use  . Smoking status: Never Smoker  . Smokeless tobacco: Never Used  Substance and Sexual Activity  . Alcohol use: No  . Drug use: No  . Sexual activity: No  Other Topics Concern  . Not on file  Social History Narrative   Lives at home w/ her husband   Right-hand   Caffeine: none    Additional Social  History: She lives with her husband. She has 4 daughters and they are very supportive.  Allergies:   Allergies  Allergen Reactions  . Erythromycin Other (See Comments)    Gi distress  . Metronidazole     Other reaction(s): Abdominal Pain  . Sulfur Other (See Comments)    Doesn't remember  . Tizanidine Other (See Comments)    GI upset  . Penicillins Rash    Has patient had a PCN reaction causing immediate rash, facial/tongue/throat swelling, SOB or lightheadedness with hypotension: No Has patient had a PCN reaction causing severe rash involving mucus membranes or skin necrosis: No Has patient had a PCN reaction that required hospitalization No Has patient had a PCN reaction occurring within the last 10 years: no If all of the above answers are "NO", then may proceed with Cephalosporin use.  . Sulfamethoxazole Rash    all over body    Metabolic Disorder Labs: No results found for: HGBA1C, MPG No results found for: PROLACTIN No results found for: CHOL, TRIG, HDL, CHOLHDL, VLDL, LDLCALC   Current Medications: Current Outpatient Medications  Medication Sig Dispense Refill  . esomeprazole (NEXIUM) 40 MG capsule TAKE 1 CAPSULE BY MOUTH TWICE DAILY. TAKE 30 MINUTES BEFORE MEALS.    [name] acetaminophen (TYLENOL) 500 MG tablet Take 250 mg by mouth daily as needed.     [name] azelastine (ASTELIN) 0.1 % nasal spray Place into the nose 2 (two) times daily. Use in each nostril as directed    . azithromycin (ZITHROMAX) 500 MG tablet TAKE 1 TABLET BY MOUTH EVERY MONDAY, WEDNESDAY, AND FRIDAY  11  . DULoxetine (CYMBALTA) 30 MG capsule Take 1 capsule (30 mg total) by mouth daily. 30 capsule 2  . gabapentin (NEURONTIN) 300 MG capsule Take by mouth.    . levothyroxine (SYNTHROID, LEVOTHROID) 50 MCG tablet Take 50 mcg by mouth daily before breakfast.    . LORazepam (ATIVAN) 0.5 MG tablet 1/2- 1 pill qhs prn 15 tablet 2  . metoprolol (LOPRESSOR) 50 MG tablet Take 50 mg by mouth 2 (two) times daily.    .  RABEprazole (ACIPHEX) 20 MG tablet Take 20 mg by mouth 2 (two) times daily.    . raloxifene (EVISTA) 60 MG tablet Take 60 mg by mouth daily.    . rifampin (RIFADIN) 300 MG capsule TAKE 2 CAPSULES BY MOUTH EVERY MONDAY, WEDNESDAY, AND FRIDAY  11  . sucralfate (CARAFATE) 1 g tablet      No current facility-administered medications for this visit.     Neurologic: Headache: No Seizure: No Paresthesias:No  Musculoskeletal: Strength & Muscle Tone: decreased Gait & Station: normal Patient leans: N/A  Psychiatric Specialty Exam: ROS  There were no vitals taken for this visit.There is no height or weight on file to calculate BMI.  General Appearance: Casual  Eye Contact:  Fair  Speech:  Slow  Volume:  Normal  Mood:  Anxious  Affect:  Congruent  Thought Process:  Coherent and Goal Directed  Orientation:  Full (Time, Place, and Person)  Thought Content:  WDL  Suicidal Thoughts:  No  Homicidal Thoughts:  No  Memory:  Immediate;   Fair Recent;   Fair  Judgement:  Fair  Insight:  Fair  Psychomotor Activity:  Normal  Concentration:  Concentration: Fair and Attention Span: Fair  Recall:  [employer] of Knowledge:Fair  Language: Fair  Akathisia:  No  Handed:  Right  AIMS (if indicated):    Assets:  Communication Skills Desire for Improvement Physical Health Social Support  ADL's:  Intact  Cognition: WNL  Sleep:      Treatment Plan Summary: Medication management   Discussed with patient what the medications at length. I will adjust her medications as follows  Ativan 0.25mg  po qhs -Prescription Was given at her last appointment and she has not filled the prescription.   Continue Cymbalta 30 mg Lorazepam 0.5 mg half to 1 pill at night on a when necessary basis  Advised her about the side effects of medication and she demonstrated understanding  Will follow up in 3 months or earlier depending on her symptoms.  More than 50% of the time spent in psychoeducation, counseling  and coordination of care.    This note was generated in part or whole with voice recognition software. Voice regonition is usually quite accurate but there are transcription errors that can and very often do occur. I apologize for any typographical errors that were not detected and corrected.    Rainey Pines, MD 11/26/201811:50 AM  Psychiatric MD Follow up Note   Patient Identification: Christine Vaughan MRN:  [id number] Date of Evaluation:  01/20/2017 Referral Source: PCP Chief Complaint:    Visit Diagnosis:    ICD-10-CM   1. GAD (generalized anxiety disorder) F41.1   2. Mild episode of recurrent major depressive disorder (HCC) F33.0 DULoxetine (CYMBALTA) 30 MG capsule    History of Present Illness:    Patient is a 83 year old female who presented for Follow-up Accompanied by her daughter. She continues to remain apprehensive and anxious. She has not filled the prescription of lorazepam which was given at her last appointment. She reported that she went to her doctor who has given her Toradol  for pain. She does not want to add it together with  lorazepam  She remains focused on her medications. Her daughter reported that she puts her pills in the pillbox but then she has to take the medications from the bottles. She has very anxious personality. She was focused on her medications. Her daughter reported that she has poor sleep at night. We discussed about eating something before going to bed. She is going to have a chest CT scan next week. Patient denied having any suicidal ideations or plans. Her family remains supportive.        Associated Signs/Symptoms: Depression Symptoms:  depressed mood, fatigue, anxiety, (Hypo) Manic Symptoms:  none Anxiety Symptoms:  Excessive Worry, Psychotic Symptoms:  none PTSD Symptoms: Negative NA  Past Psychiatric History:   Patient reported that she has tried some medications in the past but is unable to recall the names. She does not have  any history of admission to a psychiatric hospital.  Previous Psychotropic Medications:  Unable to remember the names.  Substance Abuse History in the last 12 months:  No.  Consequences of Substance Abuse: Negative NA  Past Medical History:  Past Medical History:  Diagnosis Date  . Anxiety   . Chronic kidney disease   . Hereditary and idiopathic peripheral neuropathy 12/27/2015  . Hypertension   . Thyroid disease     Past Surgical History:  Procedure Laterality Date  . BACK SURGERY    . BREAST BIOPSY Left    surgical bx pt dose not remember  . BREAST EXCISIONAL BIOPSY Right 2001   + chem rad and armidex  . BREAST LUMPECTOMY    . BREAST SURGERY    . CHOLECYSTECTOMY    . ESOPHAGOGASTRODUODENOSCOPY (EGD) WITH PROPOFOL N/A 09/20/2015   Procedure: ESOPHAGOGASTRODUODENOSCOPY (EGD) WITH PROPOFOL;  Surgeon: Lollie Sails, MD;  Location: [facility] ENDOSCOPY;  Service: Endoscopy;  Laterality: N/A;    Family Psychiatric History: Depression in her mother. Family History:  Family History  Problem Relation Age of Onset  . Depression Mother   . Brain cancer Mother     Social History:   Social History   Socioeconomic History  . Marital status: Married    Spouse name: Not on file  . Number of children: 4  . Years of education: Not on file  . Highest education level: Not on file  Social Needs  . Financial resource strain: Not on file  . Food insecurity - worry: Not on file  . Food insecurity - inability: Not on file  . Transportation needs - medical: Not on file  . Transportation needs - non-medical: Not on file  Occupational History  . Occupation: Retired  Tobacco Use  . Smoking status: Never Smoker  . Smokeless tobacco: Never Used  Substance and Sexual Activity  . Alcohol use: No  . Drug use: No  . Sexual activity: No  Other Topics Concern  . Not on file  Social History Narrative   Lives at home w/ her husband   Right-hand   Caffeine: none    Additional Social  History: She lives with her husband. She has 4 daughters and they are very supportive.  Allergies:   Allergies  Allergen Reactions  . Erythromycin Other (See Comments)    Gi distress  . Metronidazole     Other reaction(s): Abdominal Pain  . Sulfur Other (See Comments)    Doesn't remember  . Tizanidine Other (See Comments)    GI upset  . Penicillins Rash    Has patient had a PCN reaction causing immediate rash, facial/tongue/throat swelling, SOB or lightheadedness with hypotension: No Has patient had a PCN reaction causing severe rash involving mucus membranes or skin necrosis: No Has patient had a PCN reaction that required hospitalization No Has patient had a PCN reaction occurring within the last 10 years: no If all of the above answers are "NO", then may proceed with Cephalosporin use.  . Sulfamethoxazole Rash    all over body    Metabolic Disorder Labs: No results found for: HGBA1C, MPG No results found for: PROLACTIN No results found for: CHOL, TRIG, HDL, CHOLHDL, VLDL, LDLCALC   Current Medications: Current Outpatient Medications  Medication Sig Dispense Refill  . esomeprazole (NEXIUM) 40 MG capsule TAKE 1 CAPSULE BY MOUTH TWICE DAILY. TAKE 30 MINUTES BEFORE MEALS.    [name] acetaminophen (TYLENOL) 500 MG tablet Take 250 mg by mouth daily as needed.     [name] azelastine (ASTELIN) 0.1 % nasal spray Place into the nose 2 (two) times daily. Use in each nostril as directed    . azithromycin (ZITHROMAX) 500 MG tablet TAKE 1 TABLET BY MOUTH EVERY MONDAY, WEDNESDAY, AND FRIDAY  11  .  DULoxetine (CYMBALTA) 30 MG capsule Take 1 capsule (30 mg total) by mouth daily. 30 capsule 2  . gabapentin (NEURONTIN) 300 MG capsule Take by mouth.    . levothyroxine (SYNTHROID, LEVOTHROID) 50 MCG tablet Take 50 mcg by mouth daily before breakfast.    . LORazepam (ATIVAN) 0.5 MG tablet 1/2- 1 pill qhs prn 15 tablet 2  . metoprolol (LOPRESSOR) 50 MG tablet Take 50 mg by mouth 2 (two) times daily.    .  RABEprazole (ACIPHEX) 20 MG tablet Take 20 mg by mouth 2 (two) times daily.    . raloxifene (EVISTA) 60 MG tablet Take 60 mg by mouth daily.    . rifampin (RIFADIN) 300 MG capsule TAKE 2 CAPSULES BY MOUTH EVERY MONDAY, WEDNESDAY, AND FRIDAY  11  . sucralfate (CARAFATE) 1 g tablet      No current facility-administered medications for this visit.     Neurologic: Headache: No Seizure: No Paresthesias:No  Musculoskeletal: Strength & Muscle Tone: decreased Gait & Station: normal Patient leans: N/A  Psychiatric Specialty Exam: ROS  There were no vitals taken for this visit.There is no height or weight on file to calculate BMI.  General Appearance: Casual  Eye Contact:  Fair  Speech:  Slow  Volume:  Normal  Mood:  Anxious  Affect:  Congruent  Thought Process:  Coherent and Goal Directed  Orientation:  Full (Time, Place, and Person)  Thought Content:  WDL  Suicidal Thoughts:  No  Homicidal Thoughts:  No  Memory:  Immediate;   Fair Recent;   Fair  Judgement:  Fair  Insight:  Fair  Psychomotor Activity:  Normal  Concentration:  Concentration: Fair and Attention Span: Fair  Recall:  [employer] of Knowledge:Fair  Language: Fair  Akathisia:  No  Handed:  Right  AIMS (if indicated):    Assets:  Communication Skills Desire for Improvement Physical Health Social Support  ADL's:  Intact  Cognition: WNL  Sleep:      Treatment Plan Summary: Medication management   Discussed with patient what the medications at length. I will adjust her medications as follows  Ativan 0.25mg  po qhs -Prescription Was given at her last appointment and she has not filled the prescription.   She will be continued on  Cymbalta 30mg  Advised her about the side effects of medication and she demonstrated understanding  Will follow up in 2 months or earlier depending on her symptoms.  More than 50% of the time spent in psychoeducation, counseling and coordination of care.    This note was  generated in part or whole with voice recognition software. Voice regonition is usually quite accurate but there are transcription errors that can and very often do occur. I apologize for any typographical errors that were not detected and corrected.    Rainey Pines, MD 11/26/201811:50 AM

## 2017-04-14 ENCOUNTER — Ambulatory Visit (INDEPENDENT_AMBULATORY_CARE_PROVIDER_SITE_OTHER): Payer: Medicare Other | Admitting: Psychiatry

## 2017-04-14 ENCOUNTER — Encounter: Payer: Self-pay | Admitting: Psychiatry

## 2017-04-14 DIAGNOSIS — F411 Generalized anxiety disorder: Secondary | ICD-10-CM

## 2017-04-14 DIAGNOSIS — F330 Major depressive disorder, recurrent, mild: Secondary | ICD-10-CM

## 2017-04-14 MED ORDER — LORAZEPAM 0.5 MG PO TABS: ORAL_TABLET | ORAL | 0 refills | Status: DC

## 2017-04-14 MED ORDER — DULOXETINE HCL 30 MG PO CPEP: 30.0000 mg | ORAL_CAPSULE | Freq: Every day | ORAL | 2 refills | Status: DC

## 2017-04-14 NOTE — Progress Notes (Signed)
Psychiatric MD Follow up Note   Patient Identification: Christine Vaughan MRN:  [id number] Date of Evaluation:  04/14/2017 Referral Source: PCP Chief Complaint:    Visit Diagnosis:    ICD-10-CM   1. GAD (generalized anxiety disorder) F41.1   2. Mild episode of recurrent major depressive disorder (HCC) F33.0 DULoxetine (CYMBALTA) 30 MG capsule    History of Present Illness:    Patient is a 84 year old married female who presented for follow-up  She reported that she has been doing well on the Cymbalta and the medication has been working well for her. She was focused on lorazepam as she reported that her pharmacy has changed and they have been sending her several papers including warnings about the use of benzodiazepines. We discussed about lowering the dose of lorazepam and she became apprehensive. She reported that she has been trying to minimize the use of lorazepam but he still wants to continue taking the medication. She has been focused on getting a new prescription of lorazepam although she has filled the prescription on  2/7 and has not taking any pills since then. She reported that she feels happy when she has a new prescription in her pocketbook. Discussed with patient about decreasing the use of the medication as it increases the risk of falls and memory problems but she reported that she has not had any falls recently. She was focused on getting a new prescription. She reported that Cymbalta has been helping her. She lives with her husband who has been supportive. She denied having any suicidal homicidal ideations or plans.    Associated Signs/Symptoms: Depression Symptoms:  depressed mood, fatigue, anxiety, (Hypo) Manic Symptoms:  none Anxiety Symptoms:  Excessive Worry, Psychotic Symptoms:  none PTSD Symptoms: Negative NA  Past Psychiatric History:   Patient reported that she has tried some medications in the past but is unable to recall the names. She does not have any  history of admission to a psychiatric hospital.  Previous Psychotropic Medications:  Unable to remember the names.  Substance Abuse History in the last 12 months:  No.  Consequences of Substance Abuse: Negative NA  Past Medical History:  Past Medical History:  Diagnosis Date  . Anxiety   . Chronic kidney disease   . Hereditary and idiopathic peripheral neuropathy 12/27/2015  . Hypertension   . Thyroid disease     Past Surgical History:  Procedure Laterality Date  . BACK SURGERY    . BREAST BIOPSY Left    surgical bx pt dose not remember  . BREAST EXCISIONAL BIOPSY Right 2001   + chem rad and armidex  . BREAST LUMPECTOMY    . BREAST SURGERY    . CHOLECYSTECTOMY    . ESOPHAGOGASTRODUODENOSCOPY (EGD) WITH PROPOFOL N/A 09/20/2015   Procedure: ESOPHAGOGASTRODUODENOSCOPY (EGD) WITH PROPOFOL;  Surgeon: Lollie Sails, MD;  Location: [facility] ENDOSCOPY;  Service: Endoscopy;  Laterality: N/A;    Family Psychiatric History: Depression in her mother. Family History:  Family History  Problem Relation Age of Onset  . Depression Mother   . Brain cancer Mother     Social History:   Social History   Socioeconomic History  . Marital status: Married    Spouse name: Not on file  . Number of children: 4  . Years of education: Not on file  . Highest education level: Not on file  Social Needs  . Financial resource strain: Not on file  . Food insecurity - worry: Not on file  . Food insecurity -  inability: Not on file  . Transportation needs - medical: Not on file  . Transportation needs - non-medical: Not on file  Occupational History  . Occupation: Retired  Tobacco Use  . Smoking status: Never Smoker  . Smokeless tobacco: Never Used  Substance and Sexual Activity  . Alcohol use: No  . Drug use: No  . Sexual activity: No  Other Topics Concern  . Not on file  Social History Narrative   Lives at home w/ her husband   Right-hand   Caffeine: none    Additional Social  History: She lives with her husband. She has 4 daughters and they are very supportive.  Allergies:   Allergies  Allergen Reactions  . Erythromycin Other (See Comments)    Gi distress  . Metronidazole     Other reaction(s): Abdominal Pain  . Sulfur Other (See Comments)    Doesn't remember  . Tizanidine Other (See Comments)    GI upset  . Penicillins Rash    Has patient had a PCN reaction causing immediate rash, facial/tongue/throat swelling, SOB or lightheadedness with hypotension: No Has patient had a PCN reaction causing severe rash involving mucus membranes or skin necrosis: No Has patient had a PCN reaction that required hospitalization No Has patient had a PCN reaction occurring within the last 10 years: no If all of the above answers are "NO", then may proceed with Cephalosporin use.  . Sulfamethoxazole Rash    all over body    Metabolic Disorder Labs: No results found for: HGBA1C, MPG No results found for: PROLACTIN No results found for: CHOL, TRIG, HDL, CHOLHDL, VLDL, LDLCALC   Current Medications: Current Outpatient Medications  Medication Sig Dispense Refill  . acetaminophen (TYLENOL) 500 MG tablet Take 250 mg by mouth daily as needed.     [name] azelastine (ASTELIN) 0.1 % nasal spray Place into the nose 2 (two) times daily. Use in each nostril as directed    . azithromycin (ZITHROMAX) 500 MG tablet TAKE 1 TABLET BY MOUTH EVERY MONDAY, WEDNESDAY, AND FRIDAY  11  . DULoxetine (CYMBALTA) 30 MG capsule Take 1 capsule (30 mg total) by mouth daily. 30 capsule 2  . esomeprazole (NEXIUM) 40 MG capsule TAKE 1 CAPSULE BY MOUTH TWICE DAILY. TAKE 30 MINUTES BEFORE MEALS.    [name] gabapentin (NEURONTIN) 300 MG capsule Take by mouth.    . levothyroxine (SYNTHROID, LEVOTHROID) 50 MCG tablet Take 50 mcg by mouth daily before breakfast.    . LORazepam (ATIVAN) 0.5 MG tablet 1/2- 1 pill qhs prn  DO NOT FILL BEFORE 06/18/17 15 tablet 0  . metoprolol (LOPRESSOR) 50 MG tablet Take 50 mg by mouth  2 (two) times daily.    . RABEprazole (ACIPHEX) 20 MG tablet Take 20 mg by mouth 2 (two) times daily.    . raloxifene (EVISTA) 60 MG tablet Take 60 mg by mouth daily.    . rifampin (RIFADIN) 300 MG capsule TAKE 2 CAPSULES BY MOUTH EVERY MONDAY, WEDNESDAY, AND FRIDAY  11  . sucralfate (CARAFATE) 1 g tablet      No current facility-administered medications for this visit.     Neurologic: Headache: No Seizure: No Paresthesias:No  Musculoskeletal: Strength & Muscle Tone: decreased Gait & Station: normal Patient leans: N/A  Psychiatric Specialty Exam: ROS  There were no vitals taken for this visit.There is no height or weight on file to calculate BMI.  General Appearance: Casual  Eye Contact:  Fair  Speech:  Slow  Volume:  Normal  Mood:  Anxious  Affect:  Congruent  Thought Process:  Coherent and Goal Directed  Orientation:  Full (Time, Place, and Person)  Thought Content:  WDL  Suicidal Thoughts:  No  Homicidal Thoughts:  No  Memory:  Immediate;   Fair Recent;   Fair  Judgement:  Fair  Insight:  Fair  Psychomotor Activity:  Normal  Concentration:  Concentration: Fair and Attention Span: Fair  Recall:  [employer] of Knowledge:Fair  Language: Fair  Akathisia:  No  Handed:  Right  AIMS (if indicated):    Assets:  Communication Skills Desire for Improvement Physical Health Social Support  ADL's:  Intact  Cognition: WNL  Sleep:      Treatment Plan Summary: Medication management   Discussed with patient what the medications at length. I will adjust her medications as follows  Ativan 0.25mg  po qhs -patient has refills on her medication. I also gave her a new prescription which will  be filled on 4/ 24. Continue Cymbalta 30 mg   Advised her about the side effects of medication and she demonstrated understanding  Will follow up in 3 months or earlier depending on her symptoms.  More than 50% of the time spent in psychoeducation, counseling and coordination of  care.    This note was generated in part or whole with voice recognition software. Voice regonition is usually quite accurate but there are transcription errors that can and very often do occur. I apologize for any typographical errors that were not detected and corrected.    Rainey Pines, MD 2/18/20193:38 PM  Psychiatric MD Follow up Note   Patient Identification: Christine Vaughan MRN:  [id number] Date of Evaluation:  04/14/2017 Referral Source: PCP Chief Complaint:    Visit Diagnosis:    ICD-10-CM   1. GAD (generalized anxiety disorder) F41.1   2. Mild episode of recurrent major depressive disorder (HCC) F33.0 DULoxetine (CYMBALTA) 30 MG capsule    History of Present Illness:    Patient is a 84 year old female who presented for Follow-up Accompanied by her daughter. She continues to remain apprehensive and anxious. She has not filled the prescription of lorazepam which was given at her last appointment. She reported that she went to her doctor who has given her Toradol  for pain. She does not want to add it together with  lorazepam  She remains focused on her medications. Her daughter reported that she puts her pills in the pillbox but then she has to take the medications from the bottles. She has very anxious personality. She was focused on her medications. Her daughter reported that she has poor sleep at night. We discussed about eating something before going to bed. She is going to have a chest CT scan next week. Patient denied having any suicidal ideations or plans. Her family remains supportive.        Associated Signs/Symptoms: Depression Symptoms:  depressed mood, fatigue, anxiety, (Hypo) Manic Symptoms:  none Anxiety Symptoms:  Excessive Worry, Psychotic Symptoms:  none PTSD Symptoms: Negative NA  Past Psychiatric History:   Patient reported that she has tried some medications in the past but is unable to recall the names. She does not have any history of admission  to a psychiatric hospital.  Previous Psychotropic Medications:  Unable to remember the names.  Substance Abuse History in the last 12 months:  No.  Consequences of Substance Abuse: Negative NA  Past Medical History:  Past Medical History:  Diagnosis Date  . Anxiety   . Chronic kidney  disease   . Hereditary and idiopathic peripheral neuropathy 12/27/2015  . Hypertension   . Thyroid disease     Past Surgical History:  Procedure Laterality Date  . BACK SURGERY    . BREAST BIOPSY Left    surgical bx pt dose not remember  . BREAST EXCISIONAL BIOPSY Right 2001   + chem rad and armidex  . BREAST LUMPECTOMY    . BREAST SURGERY    . CHOLECYSTECTOMY    . ESOPHAGOGASTRODUODENOSCOPY (EGD) WITH PROPOFOL N/A 09/20/2015   Procedure: ESOPHAGOGASTRODUODENOSCOPY (EGD) WITH PROPOFOL;  Surgeon: Lollie Sails, MD;  Location[facility] ENDOSCOPY;  Service: Endoscopy;  Laterality: N/A;    Family Psychiatric History: Depression in her mother. Family History:  Family History  Problem Relation Age of Onset  . Depression Mother   . Brain cancer Mother     Social History:   Social History   Socioeconomic History  . Marital status: Married    Spouse name: Not on file  . Number of children: 4  . Years of education: Not on file  . Highest education level: Not on file  Social Needs  . Financial resource strain: Not on file  . Food insecurity - worry: Not on file  . Food insecurity - inability: Not on file  . Transportation needs - medical: Not on file  . Transportation needs - non-medical: Not on file  Occupational History  . Occupation: Retired  Tobacco Use  . Smoking status: Never Smoker  . Smokeless tobacco: Never Used  Substance and Sexual Activity  . Alcohol use: No  . Drug use: No  . Sexual activity: No  Other Topics Concern  . Not on file  Social History Narrative   Lives at home w/ her husband   Right-hand   Caffeine: none    Additional Social History: She lives with  her husband. She has 4 daughters and they are very supportive.  Allergies:   Allergies  Allergen Reactions  . Erythromycin Other (See Comments)    Gi distress  . Metronidazole     Other reaction(s): Abdominal Pain  . Sulfur Other (See Comments)    Doesn't remember  . Tizanidine Other (See Comments)    GI upset  . Penicillins Rash    Has patient had a PCN reaction causing immediate rash, facial/tongue/throat swelling, SOB or lightheadedness with hypotension: No Has patient had a PCN reaction causing severe rash involving mucus membranes or skin necrosis: No Has patient had a PCN reaction that required hospitalization No Has patient had a PCN reaction occurring within the last 10 years: no If all of the above answers are "NO", then may proceed with Cephalosporin use.  . Sulfamethoxazole Rash    all over body    Metabolic Disorder Labs: No results found for: HGBA1C, MPG No results found for: PROLACTIN No results found for: CHOL, TRIG, HDL, CHOLHDL, VLDL, LDLCALC   Current Medications: Current Outpatient Medications  Medication Sig Dispense Refill  . acetaminophen (TYLENOL) 500 MG tablet Take 250 mg by mouth daily as needed.     [name] azelastine (ASTELIN) 0.1 % nasal spray Place into the nose 2 (two) times daily. Use in each nostril as directed    . azithromycin (ZITHROMAX) 500 MG tablet TAKE 1 TABLET BY MOUTH EVERY MONDAY, WEDNESDAY, AND FRIDAY  11  . DULoxetine (CYMBALTA) 30 MG capsule Take 1 capsule (30 mg total) by mouth daily. 30 capsule 2  . esomeprazole (NEXIUM) 40 MG capsule TAKE 1 CAPSULE BY MOUTH TWICE  DAILY. TAKE 30 MINUTES BEFORE MEALS.    [name] gabapentin (NEURONTIN) 300 MG capsule Take by mouth.    . levothyroxine (SYNTHROID, LEVOTHROID) 50 MCG tablet Take 50 mcg by mouth daily before breakfast.    . LORazepam (ATIVAN) 0.5 MG tablet 1/2- 1 pill qhs prn  DO NOT FILL BEFORE 06/18/17 15 tablet 0  . metoprolol (LOPRESSOR) 50 MG tablet Take 50 mg by mouth 2 (two) times daily.     . RABEprazole (ACIPHEX) 20 MG tablet Take 20 mg by mouth 2 (two) times daily.    . raloxifene (EVISTA) 60 MG tablet Take 60 mg by mouth daily.    . rifampin (RIFADIN) 300 MG capsule TAKE 2 CAPSULES BY MOUTH EVERY MONDAY, WEDNESDAY, AND FRIDAY  11  . sucralfate (CARAFATE) 1 g tablet      No current facility-administered medications for this visit.     Neurologic: Headache: No Seizure: No Paresthesias:No  Musculoskeletal: Strength & Muscle Tone: decreased Gait & Station: normal Patient leans: N/A  Psychiatric Specialty Exam: ROS  There were no vitals taken for this visit.There is no height or weight on file to calculate BMI.  General Appearance: Casual  Eye Contact:  Fair  Speech:  Slow  Volume:  Normal  Mood:  Anxious  Affect:  Congruent  Thought Process:  Coherent and Goal Directed  Orientation:  Full (Time, Place, and Person)  Thought Content:  WDL  Suicidal Thoughts:  No  Homicidal Thoughts:  No  Memory:  Immediate;   Fair Recent;   Fair  Judgement:  Fair  Insight:  Fair  Psychomotor Activity:  Normal  Concentration:  Concentration: Fair and Attention Span: Fair  Recall:  [employer] of Knowledge:Fair  Language: Fair  Akathisia:  No  Handed:  Right  AIMS (if indicated):    Assets:  Communication Skills Desire for Improvement Physical Health Social Support  ADL's:  Intact  Cognition: WNL  Sleep:      Treatment Plan Summary: Medication management   Discussed with patient what the medications at length. I will adjust her medications as follows  Ativan 0.25mg  po qhs -Prescription Was given at her last appointment and she has not filled the prescription.   She will be continued on  Cymbalta 30mg  Advised her about the side effects of medication and she demonstrated understanding  Will follow up in 2 months or earlier depending on her symptoms.  More than 50% of the time spent in psychoeducation, counseling and coordination of care.    This note was  generated in part or whole with voice recognition software. Voice regonition is usually quite accurate but there are transcription errors that can and very often do occur. I apologize for any typographical errors that were not detected and corrected.    Rainey Pines, MD 2/18/20193:38 PM

## 2017-05-09 ENCOUNTER — Telehealth: Payer: Self-pay

## 2017-05-09 NOTE — Telephone Encounter (Signed)
receieved a request for a 90 day supply for duloxetine hcl dr 30mg   pt was last seen on 04-14-17 and next appt  07-07-17

## 2017-05-12 NOTE — Telephone Encounter (Signed)
Received a fax from pharmacy requesting a 90 day supply of the duloxetine hcl dr 30mg  .   DULoxetine (CYMBALTA) 30 MG capsule  Medication  Date: 04/14/2017 Department: [facility] Ordering/Authorizing: [facility], MD  Order Providers   Prescribing Provider Encounter Provider  Rainey Pines, MD Rainey Pines, MD  Medication Detail    Disp Refills Start End   DULoxetine (CYMBALTA) 30 MG capsule 30 capsule 2 04/14/2017    Sig - Route: Take 1 capsule (30 mg total) by mouth daily. - Oral   Class: Print

## 2017-05-12 NOTE — Telephone Encounter (Signed)
pt called states her husband is having knee surgery today and she has been so stressed out that she has not been able to sleep.  pt was wondering if you could give her something to help rest.

## 2017-05-12 NOTE — Telephone Encounter (Signed)
Pt has a prescription of Lorazepam which she has not filled. She can use prn for anxiety.

## 2017-05-19 ENCOUNTER — Encounter: Payer: Self-pay | Admitting: Psychiatry

## 2017-05-19 ENCOUNTER — Ambulatory Visit: Payer: Medicare Other | Admitting: Psychiatry

## 2017-05-19 DIAGNOSIS — F411 Generalized anxiety disorder: Secondary | ICD-10-CM | POA: Diagnosis not present

## 2017-05-19 DIAGNOSIS — F330 Major depressive disorder, recurrent, mild: Secondary | ICD-10-CM | POA: Diagnosis not present

## 2017-05-19 NOTE — Progress Notes (Signed)
Psychiatric MD Follow up Note   Patient Identification: Christine Vaughan MRN:  [id number] Date of Evaluation:  05/19/2017 Referral Source: PCP Chief Complaint:    Visit Diagnosis:    ICD-10-CM   1. GAD (generalized anxiety disorder) F41.1   2. Mild episode of recurrent major depressive disorder (HCC) F33.0     History of Present Illness:    Patient is a 84 year old married female who presented for follow-up accompanied by her daughter. Patient remained focus on lorazepam. She reported that she has been taking a pill every night and was concerned that she might run out of her medications.she counted her pills and was left with only 11pills of lorazepam at this time. she has been taking 0.25mg .patient reported that she has not filled her last prescription and wants to change the prescription as it was supposed to be filled on April 24 and she will run short of her medications.  She appears anxious and her daughter  was supportive. We discussed about her education's in detail. Her daughter reported that she does not have any side effects at this time and she will manage her medications and will start giving her 0.25 mg  lorazepam at night.   Discussed with patient about decreasing the use of the medication as it increases the risk of falls and memory problems but she reported that she has not had any falls recently. She was focused on getting a new prescription. She reported that Cymbalta has been helping her. She lives with her husband who has been supportive. She denied having any suicidal homicidal ideations or plans.    Associated Signs/Symptoms: Depression Symptoms:  depressed mood, fatigue, anxiety, (Hypo) Manic Symptoms:  none Anxiety Symptoms:  Excessive Worry, Psychotic Symptoms:  none PTSD Symptoms: Negative NA  Past Psychiatric History:   Patient reported that she has tried some medications in the past but is unable to recall the names. She does not have any history of  admission to a psychiatric hospital.  Previous Psychotropic Medications:  Unable to remember the names.  Substance Abuse History in the last 12 months:  No.  Consequences of Substance Abuse: Negative NA  Past Medical History:  Past Medical History:  Diagnosis Date  . Anxiety   . Chronic kidney disease   . Hereditary and idiopathic peripheral neuropathy 12/27/2015  . Hypertension   . Thyroid disease     Past Surgical History:  Procedure Laterality Date  . BACK SURGERY    . BREAST BIOPSY Left    surgical bx pt dose not remember  . BREAST EXCISIONAL BIOPSY Right 2001   + chem rad and armidex  . BREAST LUMPECTOMY    . BREAST SURGERY    . CHOLECYSTECTOMY    . ESOPHAGOGASTRODUODENOSCOPY (EGD) WITH PROPOFOL N/A 09/20/2015   Procedure: ESOPHAGOGASTRODUODENOSCOPY (EGD) WITH PROPOFOL;  Surgeon: Lollie Sails, MD;  Location: [facility] ENDOSCOPY;  Service: Endoscopy;  Laterality: N/A;    Family Psychiatric History: Depression in her mother. Family History:  Family History  Problem Relation Age of Onset  . Depression Mother   . Brain cancer Mother     Social History:   Social History   Socioeconomic History  . Marital status: Married    Spouse name: Not on file  . Number of children: 4  . Years of education: Not on file  . Highest education level: Not on file  Occupational History  . Occupation: Retired  Scientific laboratory technician  . Financial resource strain: Not on file  . Food insecurity:  Worry: Not on file    Inability: Not on file  . Transportation needs:    Medical: Not on file    Non-medical: Not on file  Tobacco Use  . Smoking status: Never Smoker  . Smokeless tobacco: Never Used  Substance and Sexual Activity  . Alcohol use: No  . Drug use: No  . Sexual activity: Never  Lifestyle  . Physical activity:    Days per week: Not on file    Minutes per session: Not on file  . Stress: Not on file  Relationships  . Social connections:    Talks on phone: Not on file     Gets together: Not on file    Attends religious service: Not on file    Active member of club or organization: Not on file    Attends meetings of clubs or organizations: Not on file    Relationship status: Not on file  Other Topics Concern  . Not on file  Social History Narrative   Lives at home w/ her husband   Right-hand   Caffeine: none    Additional Social History: She lives with her husband. She has 4 daughters and they are very supportive.  Allergies:   Allergies  Allergen Reactions  . Erythromycin Other (See Comments)    Gi distress  . Metronidazole     Other reaction(s): Abdominal Pain  . Sulfur Other (See Comments)    Doesn't remember  . Tizanidine Other (See Comments)    GI upset  . Penicillins Rash    Has patient had a PCN reaction causing immediate rash, facial/tongue/throat swelling, SOB or lightheadedness with hypotension: No Has patient had a PCN reaction causing severe rash involving mucus membranes or skin necrosis: No Has patient had a PCN reaction that required hospitalization No Has patient had a PCN reaction occurring within the last 10 years: no If all of the above answers are "NO", then may proceed with Cephalosporin use.  . Sulfamethoxazole Rash    all over body    Metabolic Disorder Labs: No results found for: HGBA1C, MPG No results found for: PROLACTIN No results found for: CHOL, TRIG, HDL, CHOLHDL, VLDL, LDLCALC   Current Medications: Current Outpatient Medications  Medication Sig Dispense Refill  . acetaminophen (TYLENOL) 500 MG tablet Take 250 mg by mouth daily as needed.     [name] azelastine (ASTELIN) 0.1 % nasal spray Place into the nose 2 (two) times daily. Use in each nostril as directed    . azithromycin (ZITHROMAX) 500 MG tablet TAKE 1 TABLET BY MOUTH EVERY MONDAY, WEDNESDAY, AND FRIDAY  11  . DULoxetine (CYMBALTA) 30 MG capsule Take 1 capsule (30 mg total) by mouth daily. 30 capsule 2  . esomeprazole (NEXIUM) 40 MG capsule TAKE 1  CAPSULE BY MOUTH TWICE DAILY. TAKE 30 MINUTES BEFORE MEALS.    [name] gabapentin (NEURONTIN) 300 MG capsule Take by mouth.    . levothyroxine (SYNTHROID, LEVOTHROID) 50 MCG tablet Take 50 mcg by mouth daily before breakfast.    . LORazepam (ATIVAN) 0.5 MG tablet 1/2- 1 pill qhs prn  DO NOT FILL BEFORE 06/18/17 15 tablet 0  . metoprolol (LOPRESSOR) 50 MG tablet Take 50 mg by mouth 2 (two) times daily.    . RABEprazole (ACIPHEX) 20 MG tablet Take 20 mg by mouth 2 (two) times daily.    . raloxifene (EVISTA) 60 MG tablet Take 60 mg by mouth daily.    . rifampin (RIFADIN) 300 MG capsule TAKE 2 CAPSULES BY  MOUTH EVERY MONDAY, WEDNESDAY, AND FRIDAY  11  . sucralfate (CARAFATE) 1 g tablet      No current facility-administered medications for this visit.     Neurologic: Headache: No Seizure: No Paresthesias:No  Musculoskeletal: Strength & Muscle Tone: decreased Gait & Station: normal Patient leans: N/A  Psychiatric Specialty Exam: ROS  There were no vitals taken for this visit.There is no height or weight on file to calculate BMI.  General Appearance: Casual  Eye Contact:  Fair  Speech:  Slow  Volume:  Normal  Mood:  Anxious  Affect:  Congruent  Thought Process:  Coherent and Goal Directed  Orientation:  Full (Time, Place, and Person)  Thought Content:  WDL  Suicidal Thoughts:  No  Homicidal Thoughts:  No  Memory:  Immediate;   Fair Recent;   Fair  Judgement:  Fair  Insight:  Fair  Psychomotor Activity:  Normal  Concentration:  Concentration: Fair and Attention Span: Fair  Recall:  [employer] of Knowledge:Fair  Language: Fair  Akathisia:  No  Handed:  Right  AIMS (if indicated):    Assets:  Communication Skills Desire for Improvement Physical Health Social Support  ADL's:  Intact  Cognition: WNL  Sleep:      Treatment Plan Summary: Medication management   Discussed with patient what the medications at length. I will adjust her medications as follows  Ativan 0.25mg  po  qhs -patien can fill her prescription on 4/ 11- refill x 1.   Continue Cymbalta 30 mg   Advised her about the side effects of medication and she demonstrated understanding  Will follow up in 1 months or earlier depending on her symptoms.  More than 50% of the time spent in psychoeducation, counseling and coordination of care.    This note was generated in part or whole with voice recognition software. Voice regonition is usually quite accurate but there are transcription errors that can and very often do occur. I apologize for any typographical errors that were not detected and corrected.    Rainey Pines, MD 3/25/20192:20 PM  Psychiatric MD Follow up Note   Patient Identification: Christine Vaughan MRN:  [id number] Date of Evaluation:  05/19/2017 Referral Source: PCP Chief Complaint:    Visit Diagnosis:    ICD-10-CM   1. GAD (generalized anxiety disorder) F41.1   2. Mild episode of recurrent major depressive disorder (HCC) F33.0     History of Present Illness:    Patient is a 84 year old female who presented for Follow-up Accompanied by her daughter. She continues to remain apprehensive and anxious. She has not filled the prescription of lorazepam which was given at her last appointment. She reported that she went to her doctor who has given her Toradol  for pain. She does not want to add it together with  lorazepam  She remains focused on her medications. Her daughter reported that she puts her pills in the pillbox but then she has to take the medications from the bottles. She has very anxious personality. She was focused on her medications. Her daughter reported that she has poor sleep at night. We discussed about eating something before going to bed. She is going to have a chest CT scan next week. Patient denied having any suicidal ideations or plans. Her family remains supportive.        Associated Signs/Symptoms: Depression Symptoms:  depressed  mood, fatigue, anxiety, (Hypo) Manic Symptoms:  none Anxiety Symptoms:  Excessive Worry, Psychotic Symptoms:  none PTSD Symptoms: Negative NA  Past Psychiatric History:   Patient reported that she has tried some medications in the past but is unable to recall the names. She does not have any history of admission to a psychiatric hospital.  Previous Psychotropic Medications:  Unable to remember the names.  Substance Abuse History in the last 12 months:  No.  Consequences of Substance Abuse: Negative NA  Past Medical History:  Past Medical History:  Diagnosis Date  . Anxiety   . Chronic kidney disease   . Hereditary and idiopathic peripheral neuropathy 12/27/2015  . Hypertension   . Thyroid disease     Past Surgical History:  Procedure Laterality Date  . BACK SURGERY    . BREAST BIOPSY Left    surgical bx pt dose not remember  . BREAST EXCISIONAL BIOPSY Right 2001   + chem rad and armidex  . BREAST LUMPECTOMY    . BREAST SURGERY    . CHOLECYSTECTOMY    . ESOPHAGOGASTRODUODENOSCOPY (EGD) WITH PROPOFOL N/A 09/20/2015   Procedure: ESOPHAGOGASTRODUODENOSCOPY (EGD) WITH PROPOFOL;  Surgeon: Lollie Sails, MD;  Location: [facility] ENDOSCOPY;  Service: Endoscopy;  Laterality: N/A;    Family Psychiatric History: Depression in her mother. Family History:  Family History  Problem Relation Age of Onset  . Depression Mother   . Brain cancer Mother     Social History:   Social History   Socioeconomic History  . Marital status: Married    Spouse name: Not on file  . Number of children: 4  . Years of education: Not on file  . Highest education level: Not on file  Occupational History  . Occupation: Retired  Scientific laboratory technician  . Financial resource strain: Not on file  . Food insecurity:    Worry: Not on file    Inability: Not on file  . Transportation needs:    Medical: Not on file    Non-medical: Not on file  Tobacco Use  . Smoking status: Never Smoker  . Smokeless  tobacco: Never Used  Substance and Sexual Activity  . Alcohol use: No  . Drug use: No  . Sexual activity: Never  Lifestyle  . Physical activity:    Days per week: Not on file    Minutes per session: Not on file  . Stress: Not on file  Relationships  . Social connections:    Talks on phone: Not on file    Gets together: Not on file    Attends religious service: Not on file    Active member of club or organization: Not on file    Attends meetings of clubs or organizations: Not on file    Relationship status: Not on file  Other Topics Concern  . Not on file  Social History Narrative   Lives at home w/ her husband   Right-hand   Caffeine: none    Additional Social History: She lives with her husband. She has 4 daughters and they are very supportive.  Allergies:   Allergies  Allergen Reactions  . Erythromycin Other (See Comments)    Gi distress  . Metronidazole     Other reaction(s): Abdominal Pain  . Sulfur Other (See Comments)    Doesn't remember  . Tizanidine Other (See Comments)    GI upset  . Penicillins Rash    Has patient had a PCN reaction causing immediate rash, facial/tongue/throat swelling, SOB or lightheadedness with hypotension: No Has patient had a PCN reaction causing severe rash involving mucus membranes or skin necrosis: No Has patient had  a PCN reaction that required hospitalization No Has patient had a PCN reaction occurring within the last 10 years: no If all of the above answers are "NO", then may proceed with Cephalosporin use.  . Sulfamethoxazole Rash    all over body    Metabolic Disorder Labs: No results found for: HGBA1C, MPG No results found for: PROLACTIN No results found for: CHOL, TRIG, HDL, CHOLHDL, VLDL, LDLCALC   Current Medications: Current Outpatient Medications  Medication Sig Dispense Refill  . acetaminophen (TYLENOL) 500 MG tablet Take 250 mg by mouth daily as needed.     [name] azelastine (ASTELIN) 0.1 % nasal spray Place into  the nose 2 (two) times daily. Use in each nostril as directed    . azithromycin (ZITHROMAX) 500 MG tablet TAKE 1 TABLET BY MOUTH EVERY MONDAY, WEDNESDAY, AND FRIDAY  11  . DULoxetine (CYMBALTA) 30 MG capsule Take 1 capsule (30 mg total) by mouth daily. 30 capsule 2  . esomeprazole (NEXIUM) 40 MG capsule TAKE 1 CAPSULE BY MOUTH TWICE DAILY. TAKE 30 MINUTES BEFORE MEALS.    [name] gabapentin (NEURONTIN) 300 MG capsule Take by mouth.    . levothyroxine (SYNTHROID, LEVOTHROID) 50 MCG tablet Take 50 mcg by mouth daily before breakfast.    . LORazepam (ATIVAN) 0.5 MG tablet 1/2- 1 pill qhs prn  DO NOT FILL BEFORE 06/18/17 15 tablet 0  . metoprolol (LOPRESSOR) 50 MG tablet Take 50 mg by mouth 2 (two) times daily.    . RABEprazole (ACIPHEX) 20 MG tablet Take 20 mg by mouth 2 (two) times daily.    . raloxifene (EVISTA) 60 MG tablet Take 60 mg by mouth daily.    . rifampin (RIFADIN) 300 MG capsule TAKE 2 CAPSULES BY MOUTH EVERY MONDAY, WEDNESDAY, AND FRIDAY  11  . sucralfate (CARAFATE) 1 g tablet      No current facility-administered medications for this visit.     Neurologic: Headache: No Seizure: No Paresthesias:No  Musculoskeletal: Strength & Muscle Tone: decreased Gait & Station: normal Patient leans: N/A  Psychiatric Specialty Exam: ROS  There were no vitals taken for this visit.There is no height or weight on file to calculate BMI.  General Appearance: Casual  Eye Contact:  Fair  Speech:  Slow  Volume:  Normal  Mood:  Anxious  Affect:  Congruent  Thought Process:  Coherent and Goal Directed  Orientation:  Full (Time, Place, and Person)  Thought Content:  WDL  Suicidal Thoughts:  No  Homicidal Thoughts:  No  Memory:  Immediate;   Fair Recent;   Fair  Judgement:  Fair  Insight:  Fair  Psychomotor Activity:  Normal  Concentration:  Concentration: Fair and Attention Span: Fair  Recall:  [employer] of Knowledge:Fair  Language: Fair  Akathisia:  No  Handed:  Right  AIMS (if  indicated):    Assets:  Communication Skills Desire for Improvement Physical Health Social Support  ADL's:  Intact  Cognition: WNL  Sleep:      Treatment Plan Summary: Medication management   Discussed with patient what the medications at length. I will adjust her medications as follows  Ativan 0.25mg  po qhs -Prescription Was given at her last appointment and she has not filled the prescription.   She will be continued on  Cymbalta 30mg  Advised her about the side effects of medication and she demonstrated understanding  Will follow up in 2 months or earlier depending on her symptoms.  More than 50% of the time spent in psychoeducation,  counseling and coordination of care.    This note was generated in part or whole with voice recognition software. Voice regonition is usually quite accurate but there are transcription errors that can and very often do occur. I apologize for any typographical errors that were not detected and corrected.    Rainey Pines, MD 3/25/20192:20 PM

## 2017-05-19 NOTE — Telephone Encounter (Signed)
Pt has supply

## 2017-06-02 ENCOUNTER — Telehealth: Payer: Self-pay

## 2017-06-02 ENCOUNTER — Other Ambulatory Visit: Payer: Self-pay | Admitting: Psychiatry

## 2017-06-02 DIAGNOSIS — F330 Major depressive disorder, recurrent, mild: Secondary | ICD-10-CM

## 2017-06-02 MED ORDER — LORAZEPAM 0.5 MG PO TABS: ORAL_TABLET | ORAL | 0 refills | Status: DC

## 2017-06-02 MED ORDER — DULOXETINE HCL 30 MG PO CPEP: 30.0000 mg | ORAL_CAPSULE | Freq: Two times a day (BID) | ORAL | 2 refills | Status: DC

## 2017-06-02 NOTE — Telephone Encounter (Signed)
Rx had to be called in because Dr. Gretel Acre did not send escriped. Pt was also called and told that rx was left on doctor's line.

## 2017-06-02 NOTE — Telephone Encounter (Signed)
Refilled cymbalta 30mg  po bid #60 - with refills.  Pt has prescription of Ativan which will be refilled on 4/11.

## 2017-06-02 NOTE — Telephone Encounter (Signed)
Pt called states that she needs a refill pt states that she take two a day per your ok.  Pt states that the pharmacy needs a new rx. .     DULoxetine (CYMBALTA) 30 MG capsule  Medication  Date: 04/14/2017 Department: [facility] Ordering/Authorizing: Rainey Pines, MD  Order Providers   Prescribing Provider Encounter Provider  Rainey Pines, MD Rainey Pines, MD  Outpatient Medication Detail    Disp Refills Start End   DULoxetine (CYMBALTA) 30 MG capsule 30 capsule 2 04/14/2017    Sig - Route: Take 1 capsule (30 mg total) by mouth daily. - Oral   Class: Print

## 2017-06-16 DIAGNOSIS — A310 Pulmonary mycobacterial infection: Secondary | ICD-10-CM | POA: Insufficient documentation

## 2017-06-18 ENCOUNTER — Other Ambulatory Visit: Payer: Self-pay | Admitting: Internal Medicine

## 2017-06-19 ENCOUNTER — Other Ambulatory Visit: Payer: Self-pay | Admitting: Internal Medicine

## 2017-06-19 DIAGNOSIS — R2232 Localized swelling, mass and lump, left upper limb: Secondary | ICD-10-CM

## 2017-06-25 ENCOUNTER — Ambulatory Visit: Payer: Medicare Other | Admitting: Occupational Therapy

## 2017-06-26 ENCOUNTER — Ambulatory Visit
Admission: RE | Admit: 2017-06-26 | Discharge: 2017-06-26 | Disposition: A | Discharge status: Home / Self Care | Payer: Medicare Other | Source: Ambulatory Visit | Attending: Internal Medicine | Admitting: Internal Medicine

## 2017-06-26 DIAGNOSIS — R2232 Localized swelling, mass and lump, left upper limb: Secondary | ICD-10-CM

## 2017-06-30 ENCOUNTER — Telehealth: Payer: Self-pay

## 2017-06-30 NOTE — Telephone Encounter (Signed)
Pt had called to see if dr. Gretel Acre would refill gabapentin for her.   Pt was told that dr Gretel Acre does not given her the gabapentin. She needed to call her primary

## 2017-07-07 ENCOUNTER — Ambulatory Visit: Payer: Medicare Other | Admitting: Psychiatry

## 2017-07-09 ENCOUNTER — Other Ambulatory Visit: Payer: Self-pay | Admitting: Gastroenterology

## 2017-07-09 DIAGNOSIS — R1084 Generalized abdominal pain: Secondary | ICD-10-CM

## 2017-07-09 DIAGNOSIS — R110 Nausea: Secondary | ICD-10-CM

## 2017-07-09 DIAGNOSIS — R300 Dysuria: Secondary | ICD-10-CM

## 2017-07-15 ENCOUNTER — Telehealth: Payer: Self-pay

## 2017-07-15 NOTE — Telephone Encounter (Signed)
this is a dr. Gretel Acre patient.  pt needs enough medication ativan sent in to get her to her appt on  07-28-17 with dr. Gretel Acre.

## 2017-07-15 NOTE — Telephone Encounter (Signed)
If patient has an appointment on 07/28/2017 , then she does not need ativan refilled now since she just filled it 06/29/2017 as per [facility]. Pt is supposed to take 0.25 mg at bedtime of ativan per Dr.Faheem and she got a months supply on 06/29/2017. Please make sure patient is following instructions .

## 2017-07-16 NOTE — Telephone Encounter (Signed)
LORazepam (ATIVAN) 0.5 MG tablet  Medication  Datept : 06/02/2017 Department: [facility] Ordering/Authorizing: Rainey Pines, MD  Order Providers   Prescribing Provider Encounter Provider  Rainey Pines, MD Rainey Pines, MD  Outpatient Medication Detail    Disp Refills Start End   LORazepam (ATIVAN) 0.5 MG tablet 15 tablet 0 06/02/2017    Sig: 1/2- 1 pill qhs prn   DO NOT FILL BEFORE 06/05/17   Class: Print

## 2017-07-17 ENCOUNTER — Ambulatory Visit
Admission: RE | Admit: 2017-07-17 | Discharge: 2017-07-17 | Disposition: A | Discharge status: Home / Self Care | Payer: Medicare Other | Source: Ambulatory Visit | Attending: Gastroenterology | Admitting: Gastroenterology

## 2017-07-17 DIAGNOSIS — R1084 Generalized abdominal pain: Secondary | ICD-10-CM | POA: Insufficient documentation

## 2017-07-17 DIAGNOSIS — J479 Bronchiectasis, uncomplicated: Secondary | ICD-10-CM | POA: Insufficient documentation

## 2017-07-17 DIAGNOSIS — R110 Nausea: Secondary | ICD-10-CM | POA: Diagnosis not present

## 2017-07-17 DIAGNOSIS — J984 Other disorders of lung: Secondary | ICD-10-CM | POA: Insufficient documentation

## 2017-07-17 DIAGNOSIS — J90 Pleural effusion, not elsewhere classified: Secondary | ICD-10-CM | POA: Diagnosis not present

## 2017-07-17 DIAGNOSIS — R300 Dysuria: Secondary | ICD-10-CM | POA: Diagnosis present

## 2017-07-17 DIAGNOSIS — K5730 Diverticulosis of large intestine without perforation or abscess without bleeding: Secondary | ICD-10-CM | POA: Insufficient documentation

## 2017-07-17 MED ORDER — IOPAMIDOL (ISOVUE-300) INJECTION 61%
100.0000 mL | Freq: Once | INTRAVENOUS | Status: AC | PRN
  Administered 2017-07-17: 100 mL via INTRAVENOUS

## 2017-07-28 ENCOUNTER — Encounter: Payer: Self-pay | Admitting: Psychiatry

## 2017-07-28 ENCOUNTER — Other Ambulatory Visit: Payer: Self-pay

## 2017-07-28 ENCOUNTER — Ambulatory Visit: Payer: Medicare Other | Admitting: Psychiatry

## 2017-07-28 VITALS — BP 125/74 | HR 69 | Temp 98.1°F | Wt 130.6 lb

## 2017-07-28 DIAGNOSIS — F411 Generalized anxiety disorder: Secondary | ICD-10-CM

## 2017-07-28 DIAGNOSIS — F330 Major depressive disorder, recurrent, mild: Secondary | ICD-10-CM | POA: Diagnosis not present

## 2017-07-28 MED ORDER — GABAPENTIN 300 MG PO CAPS: 300.0000 mg | ORAL_CAPSULE | Freq: Every day | ORAL | 11 refills | Status: DC

## 2017-07-28 MED ORDER — DULOXETINE HCL 30 MG PO CPEP: 30.0000 mg | ORAL_CAPSULE | Freq: Two times a day (BID) | ORAL | 2 refills | Status: DC

## 2017-07-28 MED ORDER — LORAZEPAM 0.5 MG PO TABS: ORAL_TABLET | ORAL | 1 refills | Status: DC

## 2017-07-28 NOTE — Progress Notes (Signed)
Psychiatric MD Follow up Note   Patient Identification: Christine Vaughan MRN:  [id number] Date of Evaluation:  07/28/2017 Referral Source: PCP Chief Complaint:    Visit Diagnosis:    ICD-10-CM   1. Mild episode of recurrent major depressive disorder ([place]) F33.0   2. GAD (generalized anxiety disorder) F41.1     History of Present Illness:    Patient is a 84 year old married female who presented for follow-up accompanied by her daughter. Patient remained focus on her GI symptoms and also brought a list of her medications.  She reported that she has been having GI pain and nausea and has been giving medications for her stomach pain.  She has been following with the gastroenterologist and has been started on several different medications.  She was discussing that in detail.  She also reported that she does not eat well.  Her daughter was also present in the interview.  Patient reported that she feels anxious and was noted to be drinking Coke.  She reported that she does not drink water and has heart burning urination.  We discussed about increasing her appetite as she has been dehydrated and has poor appetite during the daytime.  Her daughter remains receptive with her medications.   Patient has been calling our office regarding her lorazepam refill.  Advised patient that she should not be calling the office regarding the Lorazepam as she has been getting the medication refills in a timely fashion.  She reported that she does not call for refill it was only checking about my presence in the office.  Her daughter is also agreed with the plan.  She still has refills available at the pharmacy.   She is advised to take lorazepam only half a pill on a daily basis.  She currently denied having any suicidal or homicidal ideations or plans.  She denied having any perceptual disturbances.       Associated Signs/Symptoms: Depression Symptoms:  depressed mood, fatigue, anxiety, (Hypo) Manic Symptoms:   none Anxiety Symptoms:  Excessive Worry, Psychotic Symptoms:  none PTSD Symptoms: Negative NA  Past Psychiatric History:   Patient reported that she has tried some medications in the past but is unable to recall the names. She does not have any history of admission to a psychiatric hospital.  Previous Psychotropic Medications:  Unable to remember the names.  Substance Abuse History in the last 12 months:  No.  Consequences of Substance Abuse: Negative NA  Past Medical History:  Past Medical History:  Diagnosis Date  . Anxiety   . Cancer ([place])   . Chronic kidney disease   . Hereditary and idiopathic peripheral neuropathy 12/27/2015  . Hypertension   . Thyroid disease     Past Surgical History:  Procedure Laterality Date  . BACK SURGERY    . BREAST BIOPSY Left    surgical bx pt dose not remember  . BREAST EXCISIONAL BIOPSY Right 2001   + chem rad and armidex  . BREAST LUMPECTOMY    . BREAST SURGERY    . CHOLECYSTECTOMY    . ESOPHAGOGASTRODUODENOSCOPY (EGD) WITH PROPOFOL N/A 09/20/2015   Procedure: ESOPHAGOGASTRODUODENOSCOPY (EGD) WITH PROPOFOL;  Surgeon: Lollie Sails, MD;  Location: [facility] ENDOSCOPY;  Service: Endoscopy;  Laterality: N/A;    Family Psychiatric History: Depression in her mother. Family History:  Family History  Problem Relation Age of Onset  . Depression Mother   . Brain cancer Mother     Social History:   Social History   Socioeconomic  History  . Marital status: Married    Spouse name: Not on file  . Number of children: 4  . Years of education: Not on file  . Highest education level: Not on file  Occupational History  . Occupation: Retired  Scientific laboratory technician  . Financial resource strain: Not on file  . Food insecurity:    Worry: Not on file    Inability: Not on file  . Transportation needs:    Medical: Not on file    Non-medical: Not on file  Tobacco Use  . Smoking status: Never Smoker  . Smokeless tobacco: Never Used  Substance  and Sexual Activity  . Alcohol use: No  . Drug use: No  . Sexual activity: Never  Lifestyle  . Physical activity:    Days per week: Not on file    Minutes per session: Not on file  . Stress: Not on file  Relationships  . Social connections:    Talks on phone: Not on file    Gets together: Not on file    Attends religious service: Not on file    Active member of club or organization: Not on file    Attends meetings of clubs or organizations: Not on file    Relationship status: Not on file  Other Topics Concern  . Not on file  Social History Narrative   Lives at home w/ her husband   Right-hand   Caffeine: none    Additional Social History: She lives with her husband. She has 4 daughters and they are very supportive.  Allergies:   Allergies  Allergen Reactions  . Erythromycin Other (See Comments)    Gi distress  . Metronidazole     Other reaction(s): Abdominal Pain  . Sulfur Other (See Comments)    Doesn't remember  . Tizanidine Other (See Comments)    GI upset  . Penicillins Rash    Has patient had a PCN reaction causing immediate rash, facial/tongue/throat swelling, SOB or lightheadedness with hypotension: No Has patient had a PCN reaction causing severe rash involving mucus membranes or skin necrosis: No Has patient had a PCN reaction that required hospitalization No Has patient had a PCN reaction occurring within the last 10 years: no If all of the above answers are "NO", then may proceed with Cephalosporin use.  . Sulfamethoxazole Rash    all over body    Metabolic Disorder Labs: No results found for: HGBA1C, MPG No results found for: PROLACTIN No results found for: CHOL, TRIG, HDL, CHOLHDL, VLDL, LDLCALC   Current Medications: Current Outpatient Medications  Medication Sig Dispense Refill  . acetaminophen (TYLENOL) 500 MG tablet Take 250 mg by mouth daily as needed.     [name] azelastine (ASTELIN) 0.1 % nasal spray Place into the nose 2 (two) times daily. Use  in each nostril as directed    . azithromycin (ZITHROMAX) 500 MG tablet TAKE 1 TABLET BY MOUTH EVERY MONDAY, WEDNESDAY, AND FRIDAY  11  . DULoxetine (CYMBALTA) 30 MG capsule Take 1 capsule (30 mg total) by mouth 2 (two) times daily. 60 capsule 2  . esomeprazole (NEXIUM) 40 MG capsule TAKE 1 CAPSULE BY MOUTH TWICE DAILY. TAKE 30 MINUTES BEFORE MEALS.    [name] gabapentin (NEURONTIN) 300 MG capsule Take by mouth.    . levothyroxine (SYNTHROID, LEVOTHROID) 50 MCG tablet Take 50 mcg by mouth daily before breakfast.    . LORazepam (ATIVAN) 0.5 MG tablet 1/2- 1 pill qhs prn  DO NOT FILL BEFORE 06/05/17 15  tablet 0  . metoprolol (LOPRESSOR) 50 MG tablet Take 50 mg by mouth 2 (two) times daily.    . RABEprazole (ACIPHEX) 20 MG tablet Take 20 mg by mouth 2 (two) times daily.    . raloxifene (EVISTA) 60 MG tablet Take 60 mg by mouth daily.    . rifampin (RIFADIN) 300 MG capsule TAKE 2 CAPSULES BY MOUTH EVERY MONDAY, WEDNESDAY, AND FRIDAY  11  . sucralfate (CARAFATE) 1 g tablet      No current facility-administered medications for this visit.     Neurologic: Headache: No Seizure: No Paresthesias:No  Musculoskeletal: Strength & Muscle Tone: decreased Gait & Station: normal Patient leans: N/A  Psychiatric Specialty Exam: ROS  There were no vitals taken for this visit.There is no height or weight on file to calculate BMI.  General Appearance: Casual  Eye Contact:  Fair  Speech:  Slow  Volume:  Normal  Mood:  Anxious  Affect:  Congruent  Thought Process:  Coherent and Goal Directed  Orientation:  Full (Time, Place, and Person)  Thought Content:  WDL  Suicidal Thoughts:  No  Homicidal Thoughts:  No  Memory:  Immediate;   Fair Recent;   Fair  Judgement:  Fair  Insight:  Fair  Psychomotor Activity:  Normal  Concentration:  Concentration: Fair and Attention Span: Fair  Recall:  [employer] of Knowledge:Fair  Language: Fair  Akathisia:  No  Handed:  Right  AIMS (if indicated):    Assets:   Communication Skills Desire for Improvement Physical Health Social Support  ADL's:  Intact  Cognition: WNL  Sleep:      Treatment Plan Summary: Medication management   Discussed with patient what the medications at length. I will adjust her medications as follows  Ativan 0.25mg  po qhs -advised patient about the risk of falls and increase in memory problems and she demonstrated understanding.  Continue Cymbalta 30 mg p.o. twice daily  Advised her about the side effects of medication and she demonstrated understanding  Will follow up in 2  months or earlier depending on her symptoms.  More than 50% of the time spent in psychoeducation, counseling and coordination of care.    This note was generated in part or whole with voice recognition software. Voice regonition is usually quite accurate but there are transcription errors that can and very often do occur. I apologize for any typographical errors that were not detected and corrected.    Rainey Pines, MD 6/3/20191:26 PM

## 2017-08-11 NOTE — Progress Notes (Signed)
08/12/2017 4:41 PM   Christine Vaughan 08/24/1931 [id number]  Referring provider: Kirk Ruths, MD [facility] [address], [place] [address]  Chief Complaint  Patient presents with  . Dysuria    HPI: Patient is a 84 -year-old Caucasian female who presents today as a referral from Laurine Blazer, [place] for dysuria with her granddaughter, Curt Bears.    She states that when she urinates the urine is hot in her vaginal area and then her whole body terms of warm like a fever for 1 to 2 hours.  She states that when she switched to taking her Synthroid in the afternoon to the morning, her symptoms have abated.    She is having burning and pain with urination, nocturia x 2-3, mild incontinence x one little pad, intermittency and hesitancy.  Patient denies any gross hematuria or suprapubic/flank pain.  Patient denies any fevers, chills, nausea or vomiting.  Her UA today is negative.    Patient was found to have microscopic hematuria on several occassions with 0-3 RBC's/hpf.    She has not had any bladder infections this year.  She does not have a history of nephrolithiasis, trauma to the genitourinary tract or malignancies of the genitourinary tract.   She does not have a family medical history of nephrolithiasis, malignancies of the genitourinary tract or hematuria.   Contrast CT on 07/17/2017 noted the adrenal glands are unremarkable. The kidneys enhance with no mass or calculus. On delayed images, pelvocaliceal systems are unremarkable and the proximal ureters appear normal in caliber. The distal ureters are normal in caliber and the urinary bladder is unremarkable although not optimally distended.  She is not a smoker.  She is not exposed to chemicals.      PMH: Past Medical History:  Diagnosis Date  . Anxiety   . Cancer ([place])   . Chronic kidney disease   . Hereditary and idiopathic peripheral neuropathy 12/27/2015  . Hypertension   . Thyroid  disease     Surgical History: Past Surgical History:  Procedure Laterality Date  . BACK SURGERY    . BREAST BIOPSY Left    surgical bx pt dose not remember  . BREAST EXCISIONAL BIOPSY Right 2001   + chem rad and armidex  . BREAST LUMPECTOMY    . BREAST SURGERY    . CHOLECYSTECTOMY    . ESOPHAGOGASTRODUODENOSCOPY (EGD) WITH PROPOFOL N/A 09/20/2015   Procedure: ESOPHAGOGASTRODUODENOSCOPY (EGD) WITH PROPOFOL;  Surgeon: Lollie Sails, MD;  Location: [facility] ENDOSCOPY;  Service: Endoscopy;  Laterality: N/A;    Home Medications:  Allergies as of 08/12/2017      Reactions   Erythromycin Other (See Comments)   Gi distress   Metronidazole    Other reaction(s): Abdominal Pain   Sulfur Other (See Comments)   Doesn't remember   Tizanidine Other (See Comments)   GI upset   Penicillins Rash   Has patient had a PCN reaction causing immediate rash, facial/tongue/throat swelling, SOB or lightheadedness with hypotension: No Has patient had a PCN reaction causing severe rash involving mucus membranes or skin necrosis: No Has patient had a PCN reaction that required hospitalization No Has patient had a PCN reaction occurring within the last 10 years: no If all of the above answers are "NO", then may proceed with Cephalosporin use.   Sulfamethoxazole Rash   all over body      Medication List        Accurate as of 08/12/17  4:41 PM. Always use your most recent med list.          acetaminophen 500 MG tablet Commonly known as:  TYLENOL Take 250 mg by mouth daily as needed.   azelastine 0.1 % nasal spray Commonly known as:  ASTELIN Place into the nose 2 (two) times daily. Use in each nostril as directed   azithromycin 500 MG tablet Commonly known as:  ZITHROMAX TAKE 1 TABLET BY MOUTH EVERY MONDAY, WEDNESDAY, AND FRIDAY   dicyclomine 20 MG tablet Commonly known as:  BENTYL Take 20 mg by mouth every 6 (six) hours.   DULoxetine 30 MG capsule Commonly known as:  CYMBALTA Take 1  capsule (30 mg total) by mouth 2 (two) times daily.   esomeprazole 40 MG capsule Commonly known as:  NEXIUM TAKE 1 CAPSULE BY MOUTH TWICE DAILY. TAKE 30 MINUTES BEFORE MEALS.   gabapentin 300 MG capsule Commonly known as:  NEURONTIN Take 1 capsule (300 mg total) by mouth at bedtime.   levothyroxine 50 MCG tablet Commonly known as:  SYNTHROID, LEVOTHROID Take 50 mcg by mouth daily before breakfast.   LORazepam 0.5 MG tablet Commonly known as:  ATIVAN 1/2  pill prn   DO NOT FILL EARLY   metoprolol tartrate 50 MG tablet Commonly known as:  LOPRESSOR Take 50 mg by mouth 2 (two) times daily.   RABEprazole 20 MG tablet Commonly known as:  ACIPHEX Take 20 mg by mouth 2 (two) times daily.   raloxifene 60 MG tablet Commonly known as:  EVISTA Take 60 mg by mouth daily.   rifampin 300 MG capsule Commonly known as:  RIFADIN TAKE 2 CAPSULES BY MOUTH EVERY MONDAY, WEDNESDAY, AND FRIDAY   sucralfate 1 g tablet Commonly known as:  CARAFATE       Allergies:  Allergies  Allergen Reactions  . Erythromycin Other (See Comments)    Gi distress  . Metronidazole     Other reaction(s): Abdominal Pain  . Sulfur Other (See Comments)    Doesn't remember  . Tizanidine Other (See Comments)    GI upset  . Penicillins Rash    Has patient had a PCN reaction causing immediate rash, facial/tongue/throat swelling, SOB or lightheadedness with hypotension: No Has patient had a PCN reaction causing severe rash involving mucus membranes or skin necrosis: No Has patient had a PCN reaction that required hospitalization No Has patient had a PCN reaction occurring within the last 10 years: no If all of the above answers are "NO", then may proceed with Cephalosporin use.  . Sulfamethoxazole Rash    all over body    Family History: Family History  Problem Relation Age of Onset  . Depression Mother   . Brain cancer Mother     Social History:  reports that she has never smoked. She has never  used smokeless tobacco. She reports that she does not drink alcohol or use drugs.  ROS: UROLOGY Frequent Urination?: No Hard to postpone urination?: No Burning/pain with urination?: Yes Get up at night to urinate?: Yes Leakage of urine?: Yes Urine stream starts and stops?: Yes Trouble starting stream?: Yes Do you have to strain to urinate?: No Blood in urine?: No Urinary tract infection?: No Sexually transmitted disease?: No Injury to kidneys or bladder?: No Painful intercourse?: No Weak stream?: No Currently pregnant?: No Vaginal bleeding?: No Last menstrual period?: n  Gastrointestinal Nausea?: Yes Vomiting?: No Indigestion/heartburn?: Yes Diarrhea?: No Constipation?: Yes  Constitutional Fever: No Night sweats?: No Weight loss?: Yes Fatigue?: Yes  Skin Skin rash/lesions?: No Itching?: No  Eyes Blurred vision?: Yes Double vision?: No  Ears/Nose/Throat Sore throat?: No Sinus problems?: Yes  Hematologic/Lymphatic Swollen glands?: No Easy bruising?: No  Cardiovascular Leg swelling?: No Chest pain?: No  Respiratory Cough?: Yes Shortness of breath?: Yes  Endocrine Excessive thirst?: No  Musculoskeletal Back pain?: No Joint pain?: No  Neurological Headaches?: No Dizziness?: No  Psychologic Depression?: Yes Anxiety?: Yes  Physical Exam: BP 124/78 (BP Location: Left Arm, Patient Position: Sitting, Cuff Size: Normal)   Pulse 84   Ht 5\' 6"  (1.676 m)   Wt 127 lb 14.4 oz (58 kg)   BMI 20.64 kg/m   Constitutional:  Well nourished. Alert and oriented, No acute distress. HEENT: [place] AT, moist mucus membranes.  Trachea midline, no masses. Cardiovascular: No clubbing, cyanosis, or edema. Respiratory: Normal respiratory effort, no increased work of breathing. GI: Abdomen is soft, non tender, non distended, no abdominal masses. Liver and spleen not palpable.  No hernias appreciated.  Stool sample for occult testing is not indicated.   GU: No CVA  tenderness.  No bladder fullness or masses.  Atrophic external genitalia, normal pubic hair distribution, no lesions.  Normal urethral meatus, no lesions, no prolapse, no discharge.   No urethral masses, tenderness and/or tenderness. No bladder fullness, tenderness or masses. Pale vagina mucosa, poor estrogen effect, no discharge, no lesions, good pelvic support, no cystocele or rectocele noted.  No cervical motion tenderness.  Uterus is freely mobile and non-fixed.  No adnexal/parametria masses or tenderness noted.  Anus and perineum are without rashes or lesions.    Skin: No rashes, bruises or suspicious lesions. Lymph: No cervical or inguinal adenopathy. Neurologic: Grossly intact, no focal deficits, moving all 4 extremities. Psychiatric: Normal mood and affect.  Laboratory Data: Lab Results  Component Value Date   WBC 7.3 09/13/2015   HGB 14.2 09/13/2015   HCT 40.0 09/13/2015   MCV 95.5 09/13/2015   PLT 252 09/13/2015    Lab Results  Component Value Date   CREATININE 0.74 09/13/2015    No results found for: PSA  No results found for: TESTOSTERONE  No results found for: HGBA1C  Lab Results  Component Value Date   TSH 2.879 09/13/2015    No results found for: CHOL, HDL, CHOLHDL, VLDL, LDLCALC  Lab Results  Component Value Date   AST 20 09/13/2015   Lab Results  Component Value Date   ALT 12 (L) 09/13/2015   No components found for: ALKALINEPHOPHATASE No components found for: BILIRUBINTOTAL  No results found for: ESTRADIOL   Urinalysis Negative.  See Epic.   Pertinent Imaging: CLINICAL DATA:  Dysuria, upper abdominal pain, weight loss of 60 pounds over the last year, history of right breast carcinoma  EXAM: CT ABDOMEN AND PELVIS WITH CONTRAST  TECHNIQUE: Multidetector CT imaging of the abdomen and pelvis was performed using the standard protocol following bolus administration of intravenous contrast.  CONTRAST:  132mL ISOVUE-300 IOPAMIDOL  (ISOVUE-300) INJECTION 61%  COMPARISON:  CT abdomen and pelvis of 09/08/2015  FINDINGS: Lower chest: Chronic changes within the right middle lobe and lingula are again noted consistent with scarring and bronchiectatic change. Minimally prominent markings in the right lower lobe posteriorly may reflect inflammatory or infectious process, and there does appear to be a small right pleural effusion present. Cardiomegaly is stable.  Hepatobiliary: The liver enhances with no focal abnormality and no ductal dilatation is seen. Surgical clips are present from prior cholecystectomy. The contours the liver are slightly nodular.  Is there any clinical suspicion of cirrhosis?  Pancreas: The pancreas is normal in size and the pancreatic duct is not dilated.  Spleen: The spleen is unremarkable although a few calcifications are present suggesting prior granulomatous disease.  Adrenals/Urinary Tract: The adrenal glands are unremarkable. The kidneys enhance with no mass or calculus. On delayed images, pelvocaliceal systems are unremarkable and the proximal ureters appear normal in caliber. The distal ureters are normal in caliber and the urinary bladder is unremarkable although not optimally distended.  Stomach/Bowel: The stomach is not well distended making assessment very difficult. No gross abnormality is evident. Prominence of the mucosa of the proximal stomach cannot be excluded on this exam. In view of the patient's history upper GI or endoscopy would be recommended no abnormality of the small bowel is seen. Multiple diverticula are noted diffusely throughout the colon. No diverticulitis is seen. There is a moderate amount of feces throughout the colon as well. The terminal ileum is unremarkable, and the appendix is not visualized but no inflammatory process is seen within the right lower quadrant.  Vascular/Lymphatic: The abdominal aorta is normal in caliber. No adenopathy is  seen.  Reproductive: The uterus is normal in size for age. No adnexal lesion is seen. No pelvic wall adenopathy is noted.  Other: Poorly defined opacity in the left buttocks may be due to injection. Correlate clinically. No abdominal wall hernia is seen.  Musculoskeletal: The lumbar vertebrae are in normal alignment with mild degenerative disc disease at L5-S1.  IMPRESSION: 1. No definite explanation for the patient's symptoms is seen. However the mucosa of the proximal stomach could be prominent although poorly distended on the current study and further assessment with upper GI or endoscopy is recommended. 2. Small right pleural effusion. Chronic changes involving the right middle lobe and lingula with scarring and bronchiectatic change. 3. Multiple colonic diverticula.   Electronically Signed   By: Ivar Drape M.D.   On: 07/17/2017 16:17   I have reviewed the images and patient is being seen by GI.      Assessment & Plan:    1. Microscopic hematuria Explained to the patient that there are a number of causes that can be associated with blood in the urine, such as stones, UTI's, damage to the urinary tract and/or cancer. Explained to the patient that the imaging studies performed on 07/17/2017 are not the recommended studies by the [facility] for the work-up of blood in the urine.    The imaging studies that were performed lack the detail of excluding some urological tumors. The recommended study is a CT urogram.  This study does require the use of contrast material.    At this time, he/she may choose to forego the appropriate study with the understanding that they are risking a missed diagnosis of a urological cancer and proceed with in office cystoscopy - patient is elderly with co-morbidities, so we will not pursue a CTU at this time and just proceed with the in office cystoscopy I described how this is performed, typically in an office setting with a flexible cystoscope. We  described the risks, benefits, and possible side effects, the most common of which is a minor amount of blood in the urine and/or burning which usually resolves in 24 to 48 hours.    - UA   - Urine culture     2. Dysuria UA is negative Urine culture and cystoscopy are pending  3. Vaginal atrophy Not a candidate for vaginal estrogen therapy due to  history of breast cancer  Return for patinet will call to schedule .  These notes generated with voice recognition software. I apologize for typographical errors.  Zara Council, PA-C  [facility] [address] [place]  [place], [place] [address] [phone]

## 2017-08-12 ENCOUNTER — Ambulatory Visit: Payer: Medicare Other | Admitting: Urology

## 2017-08-12 ENCOUNTER — Encounter: Payer: Self-pay | Admitting: Urology

## 2017-08-12 VITALS — BP 124/78 | HR 84 | Ht 66.0 in | Wt 127.9 lb

## 2017-08-12 DIAGNOSIS — R3129 Other microscopic hematuria: Secondary | ICD-10-CM

## 2017-08-12 DIAGNOSIS — N952 Postmenopausal atrophic vaginitis: Secondary | ICD-10-CM

## 2017-08-12 DIAGNOSIS — R300 Dysuria: Secondary | ICD-10-CM

## 2017-08-12 LAB — URINALYSIS, COMPLETE
Bilirubin, UA: NEGATIVE
Glucose, UA: NEGATIVE
Ketones, UA: NEGATIVE
Leukocytes, UA: NEGATIVE
Nitrite, UA: NEGATIVE
Protein, UA: NEGATIVE
Specific Gravity, UA: 1.01 (ref 1.005–1.030)
Urobilinogen, Ur: 0.2 mg/dL (ref 0.2–1.0)
pH, UA: 6 (ref 5.0–7.5)

## 2017-08-12 LAB — MICROSCOPIC EXAMINATION

## 2017-08-15 LAB — CULTURE, URINE COMPREHENSIVE

## 2017-08-18 ENCOUNTER — Ambulatory Visit: Payer: Medicare Other | Admitting: Psychiatry

## 2017-08-18 ENCOUNTER — Other Ambulatory Visit: Payer: Self-pay

## 2017-08-18 ENCOUNTER — Encounter: Payer: Self-pay | Admitting: Psychiatry

## 2017-08-18 VITALS — BP 161/89 | HR 83 | Wt 129.0 lb

## 2017-08-18 DIAGNOSIS — F330 Major depressive disorder, recurrent, mild: Secondary | ICD-10-CM

## 2017-08-18 DIAGNOSIS — F411 Generalized anxiety disorder: Secondary | ICD-10-CM | POA: Diagnosis not present

## 2017-08-18 MED ORDER — LORAZEPAM 0.5 MG PO TABS: ORAL_TABLET | ORAL | 1 refills | Status: DC

## 2017-08-18 MED ORDER — DULOXETINE HCL 30 MG PO CPEP: 30.0000 mg | ORAL_CAPSULE | Freq: Two times a day (BID) | ORAL | 2 refills | Status: DC

## 2017-08-18 NOTE — Progress Notes (Signed)
Psychiatric MD Follow up Note   Patient Identification: Christine Vaughan MRN:  [id number] Date of Evaluation:  08/18/2017 Referral Source: PCP Chief Complaint:   Chief Complaint    Follow-up; Medication Refill     Visit Diagnosis:    ICD-10-CM   1. Mild episode of recurrent major depressive disorder ([place]) F33.0   2. GAD (generalized anxiety disorder) F41.1     History of Present Illness:    Patient is a 85 year old married female who presented for follow-up. Patient stated that she ran out of her Cymbalta and wants a refill.  She stated that her sister has been visiting and she has low energy.  She stated that she feels that her depressive symptoms are not improving and wants a refill of her Cymbalta.  She does not have any side effects of the medication.  She is also taking Ativan as prescribed.  We discussed about medications.  She stated that she does not have any energy to cook food preparing meals for her.  Advised patient to start drinking Ensure on a daily basis to help boost her appetite.  She demonstrated understanding.  She denied having any perceptual disturbances.  Her memory is stable at this time.  She does not want to add any other medication.  Advised her about decreasing the dose of lorazepam but she is not willing to stop taking the medication.      She is advised to take lorazepam only half a pill on a daily basis.  She currently denied having any suicidal or homicidal ideations or plans.  She denied having any perceptual disturbances.       Associated Signs/Symptoms: Depression Symptoms:  depressed mood, fatigue, anxiety, (Hypo) Manic Symptoms:  none Anxiety Symptoms:  Excessive Worry, Psychotic Symptoms:  none PTSD Symptoms: Negative NA  Past Psychiatric History:   Patient reported that she has tried some medications in the past but is unable to recall the names. She does not have any history of admission to a psychiatric hospital.  Previous  Psychotropic Medications:  Unable to remember the names.  Substance Abuse History in the last 12 months:  No.  Consequences of Substance Abuse: Negative NA  Past Medical History:  Past Medical History:  Diagnosis Date  . Anxiety   . Cancer ([place])   . Chronic kidney disease   . Hereditary and idiopathic peripheral neuropathy 12/27/2015  . Hypertension   . Thyroid disease     Past Surgical History:  Procedure Laterality Date  . BACK SURGERY    . BREAST BIOPSY Left    surgical bx pt dose not remember  . BREAST EXCISIONAL BIOPSY Right 2001   + chem rad and armidex  . BREAST LUMPECTOMY    . BREAST SURGERY    . CHOLECYSTECTOMY    . ESOPHAGOGASTRODUODENOSCOPY (EGD) WITH PROPOFOL N/A 09/20/2015   Procedure: ESOPHAGOGASTRODUODENOSCOPY (EGD) WITH PROPOFOL;  Surgeon: Lollie Sails, MD;  Location[facility] ENDOSCOPY;  Service: Endoscopy;  Laterality: N/A;    Family Psychiatric History: Depression in her mother. Family History:  Family History  Problem Relation Age of Onset  . Depression Mother   . Brain cancer Mother     Social History:   Social History   Socioeconomic History  . Marital status: Married    Spouse name: Not on file  . Number of children: 4  . Years of education: Not on file  . Highest education level: Not on file  Occupational History  . Occupation: Retired  Scientific laboratory technician  .  Financial resource strain: Not on file  . Food insecurity:    Worry: Not on file    Inability: Not on file  . Transportation needs:    Medical: Not on file    Non-medical: Not on file  Tobacco Use  . Smoking status: Never Smoker  . Smokeless tobacco: Never Used  Substance and Sexual Activity  . Alcohol use: No  . Drug use: No  . Sexual activity: Never  Lifestyle  . Physical activity:    Days per week: Not on file    Minutes per session: Not on file  . Stress: Not on file  Relationships  . Social connections:    Talks on phone: Not on file    Gets together: Not on file     Attends religious service: Not on file    Active member of club or organization: Not on file    Attends meetings of clubs or organizations: Not on file    Relationship status: Not on file  Other Topics Concern  . Not on file  Social History Narrative   Lives at home w/ her husband   Right-hand   Caffeine: none    Additional Social History: She lives with her husband. She has 4 daughters and they are very supportive.  Allergies:   Allergies  Allergen Reactions  . Erythromycin Other (See Comments)    Gi distress  . Metronidazole     Other reaction(s): Abdominal Pain  . Sulfur Other (See Comments)    Doesn't remember  . Tizanidine Other (See Comments)    GI upset  . Penicillins Rash    Has patient had a PCN reaction causing immediate rash, facial/tongue/throat swelling, SOB or lightheadedness with hypotension: No Has patient had a PCN reaction causing severe rash involving mucus membranes or skin necrosis: No Has patient had a PCN reaction that required hospitalization No Has patient had a PCN reaction occurring within the last 10 years: no If all of the above answers are "NO", then may proceed with Cephalosporin use.  . Sulfamethoxazole Rash    all over body    Metabolic Disorder Labs: No results found for: HGBA1C, MPG No results found for: PROLACTIN No results found for: CHOL, TRIG, HDL, CHOLHDL, VLDL, LDLCALC   Current Medications: Current Outpatient Medications  Medication Sig Dispense Refill  . acetaminophen (TYLENOL) 500 MG tablet Take 250 mg by mouth daily as needed.     [name] azelastine (ASTELIN) 0.1 % nasal spray Place into the nose 2 (two) times daily. Use in each nostril as directed    . azithromycin (ZITHROMAX) 500 MG tablet TAKE 1 TABLET BY MOUTH EVERY MONDAY, WEDNESDAY, AND FRIDAY  11  . dicyclomine (BENTYL) 20 MG tablet Take 20 mg by mouth every 6 (six) hours.    . DULoxetine (CYMBALTA) 30 MG capsule Take 1 capsule (30 mg total) by mouth 2 (two) times daily.  60 capsule 2  . esomeprazole (NEXIUM) 40 MG capsule TAKE 1 CAPSULE BY MOUTH TWICE DAILY. TAKE 30 MINUTES BEFORE MEALS.    [name] gabapentin (NEURONTIN) 300 MG capsule Take 1 capsule (300 mg total) by mouth at bedtime. 30 capsule 11  . levothyroxine (SYNTHROID, LEVOTHROID) 50 MCG tablet Take 50 mcg by mouth daily before breakfast.    . LORazepam (ATIVAN) 0.5 MG tablet 1/2  pill prn   DO NOT FILL EARLY 15 tablet 1  . metoprolol (LOPRESSOR) 50 MG tablet Take 50 mg by mouth 2 (two) times daily.    . RABEprazole (  ACIPHEX) 20 MG tablet Take 20 mg by mouth 2 (two) times daily.    . raloxifene (EVISTA) 60 MG tablet Take 60 mg by mouth daily.    . rifampin (RIFADIN) 300 MG capsule TAKE 2 CAPSULES BY MOUTH EVERY MONDAY, WEDNESDAY, AND FRIDAY  11  . sucralfate (CARAFATE) 1 g tablet      No current facility-administered medications for this visit.     Neurologic: Headache: No Seizure: No Paresthesias:No  Musculoskeletal: Strength & Muscle Tone: decreased Gait & Station: normal Patient leans: N/A  Psychiatric Specialty Exam: ROS  Blood pressure (!) 161/89, pulse 83, weight 129 lb (58.5 kg).Body mass index is 20.82 kg/m.  General Appearance: Casual  Eye Contact:  Fair  Speech:  Slow  Volume:  Normal  Mood:  Anxious  Affect:  Congruent  Thought Process:  Coherent and Goal Directed  Orientation:  Full (Time, Place, and Person)  Thought Content:  WDL  Suicidal Thoughts:  No  Homicidal Thoughts:  No  Memory:  Immediate;   Fair Recent;   Fair  Judgement:  Fair  Insight:  Fair  Psychomotor Activity:  Normal  Concentration:  Concentration: Fair and Attention Span: Fair  Recall:  [employer] of Knowledge:Fair  Language: Fair  Akathisia:  No  Handed:  Right  AIMS (if indicated):    Assets:  Communication Skills Desire for Improvement Physical Health Social Support  ADL's:  Intact  Cognition: WNL  Sleep:      Treatment Plan Summary: Medication management   Discussed with patient  what the medications at length. I will adjust her medications as follows  Ativan 0.25mg  po qhs -advised patient about the risk of falls and increase in memory problems and she demonstrated understanding.  Continue Cymbalta 30 mg p.o. twice daily  Advised her about the side effects of medication and she demonstrated understanding  Will follow up in 2  months or earlier depending on her symptoms.  More than 50% of the time spent in psychoeducation, counseling and coordination of care.    This note was generated in part or whole with voice recognition software. Voice regonition is usually quite accurate but there are transcription errors that can and very often do occur. I apologize for any typographical errors that were not detected and corrected.    Rainey Pines, MD 6/24/20194:17 PM

## 2017-08-22 ENCOUNTER — Telehealth: Payer: Self-pay | Admitting: Urology

## 2017-08-22 NOTE — Telephone Encounter (Signed)
Patient scheduled a cysto with Dr. Erlene Quan on 8/2.  She is requesting a prescription for something to help her relax during the procedure.  She is very nervous and anxious about the procedure.

## 2017-08-25 ENCOUNTER — Telehealth: Payer: Self-pay | Admitting: Urology

## 2017-08-25 NOTE — Telephone Encounter (Signed)
Pt calles office very anxious, would like to speak to someone about why she isn't urinating as much as she feels she should after drinking water/Tea, pt also states she is having cramps like a 82 yr old in the pelvic area, pt states she is has developed severe depression because of all of this. Pt would like to speak to someone for advise. Please advise pt [phone].

## 2017-08-25 NOTE — Telephone Encounter (Signed)
Spoke w/ pt, she states she does not want to come in until her cystoscope appointment in August.

## 2017-09-05 ENCOUNTER — Other Ambulatory Visit: Payer: Self-pay | Admitting: Specialist

## 2017-09-05 DIAGNOSIS — J849 Interstitial pulmonary disease, unspecified: Secondary | ICD-10-CM

## 2017-09-07 NOTE — Telephone Encounter (Signed)
Christine Vaughan is scheduled on 07/26 for a cystoscopy.  Does she also have one with Dr. Erlene Quan on the 08/02?

## 2017-09-19 ENCOUNTER — Encounter: Payer: Self-pay | Admitting: Urology

## 2017-09-19 ENCOUNTER — Ambulatory Visit: Payer: Medicare Other | Admitting: Urology

## 2017-09-19 ENCOUNTER — Encounter

## 2017-09-19 VITALS — BP 141/84 | HR 83 | Ht 66.0 in | Wt 127.0 lb

## 2017-09-19 DIAGNOSIS — R3129 Other microscopic hematuria: Secondary | ICD-10-CM | POA: Diagnosis not present

## 2017-09-19 LAB — MICROSCOPIC EXAMINATION: WBC, UA: NONE SEEN /hpf (ref 0–5)

## 2017-09-19 LAB — URINALYSIS, COMPLETE
Bilirubin, UA: NEGATIVE
Glucose, UA: NEGATIVE
Ketones, UA: NEGATIVE
Leukocytes, UA: NEGATIVE
Nitrite, UA: NEGATIVE
Protein, UA: NEGATIVE
RBC, UA: NEGATIVE
Specific Gravity, UA: 1.01 (ref 1.005–1.030)
Urobilinogen, Ur: 0.2 mg/dL (ref 0.2–1.0)
pH, UA: 6.5 (ref 5.0–7.5)

## 2017-09-19 LAB — BLADDER SCAN AMB NON-IMAGING

## 2017-09-19 MED ORDER — LIDOCAINE HCL URETHRAL/MUCOSAL 2 % EX GEL: 1.0000 "application " | Freq: Once | CUTANEOUS | Status: DC

## 2017-09-19 MED ORDER — CIPROFLOXACIN HCL 500 MG PO TABS: 500.0000 mg | ORAL_TABLET | Freq: Once | ORAL | Status: DC

## 2017-09-19 NOTE — Progress Notes (Signed)
   09/19/17  CC:  Chief Complaint  Patient presents with  . Cysto    HPI: Refer to Christine Vaughan's note of 08/12/2017.  Her dysuria has resolved.  She complains of suprapubic/lower pelvic discomfort worse in the morning.  Blood pressure (!) 141/84, pulse 83, height 5\' 6"  (1.676 m), weight 127 lb (57.6 kg). NED. A&Ox3.   No respiratory distress   Abd soft, NT, ND Atrophic external genitalia with patent urethral meatus  Cystoscopy Procedure Note  Patient identification was confirmed, informed consent was obtained, and patient was prepped using Betadine solution.  Lidocaine jelly was administered per urethral meatus.    Preoperative abx where received prior to procedure.    Procedure: - Flexible cystoscope introduced, without any difficulty.  No irrigation fluid was introduced and the bladder appeared full - Thorough search of the bladder revealed:    normal urethral meatus    normal urothelium    no stones    no ulcers     no tumors    no urethral polyps    Moderate trabeculation with prominent cellule formation  - Ureteral orifices were normal in position and appearance.  Post-Procedure: - Patient tolerated the procedure well  Assessment/ Plan: No mucosal abnormalities or tumor on cystoscopy.  She is unable to completely empty her bladder and a bladder scan after cystoscopy which was performed without irrigant was 300 mL.  It is unlikely this is a source of her pain however we will have her follow-up with [facility] in 2 weeks for a repeat bladder scan and if still with incomplete emptying would recommend starting intermittent catheterization.   Abbie Sons, MD

## 2017-09-23 ENCOUNTER — Telehealth: Payer: Self-pay

## 2017-09-23 NOTE — Telephone Encounter (Signed)
Can you please check with her if she meant Ciprofloxacin or something else. As per micromedex Ciprofloxacin may result in increased bioavailability of Cymbalta and therefore cause side effects. If she is planning to take Ciprofloxacin then she can perhaps reduce the dose of Cymbalta to 30 mg once daily and call when Dr. Gretel Acre when she is in the office to discuss further and perhaps schedule an early appointment.

## 2017-09-23 NOTE — Telephone Encounter (Signed)
pt called left message that she went to her lung doctor today and he wants to put her on cipro but that she will need to stop the cymbalta because you can not take both.  pt needs to make sure that it is ok to stop taking and also find out if there anything else she can take that will be safe to take with cipro.

## 2017-09-26 ENCOUNTER — Other Ambulatory Visit: Payer: Self-pay | Admitting: Urology

## 2017-09-29 NOTE — Progress Notes (Signed)
09/30/2017 3:47 PM   [name] Oct 30, 1931 [id number]  Referring provider: Kirk Ruths, MD [place] [facility] [place], [place] [address]  Chief Complaint  Patient presents with  . Hematuria    HPI: Patient is a 84 year old Caucasian female with a history of hematuria, dysuria and vaginal atrophy who presents today for follow up.  Background history Patient is a 64 -year-old Caucasian female who presents today as a referral from Laurine Blazer, [place] for dysuria with her granddaughter, Curt Bears.  She states that when she urinates the urine is hot in her vaginal area and then her whole body terms of warm like a fever for 1 to 2 hours.  She states that when she switched to taking her Synthroid in the afternoon to the morning, her symptoms have abated.   She is having burning and pain with urination, nocturia x 2-3, mild incontinence x one little pad, intermittency and hesitancy.  Patient denies any gross hematuria or suprapubic/flank pain.  Patient denies any fevers, chills, nausea or vomiting.  Her UA today is negative.   Patient was found to have microscopic hematuria on several occassions with 0-3 RBC's/hpf.   She has not had any bladder infections this year.  She does not have a history of nephrolithiasis, trauma to the genitourinary tract or malignancies of the genitourinary tract.  She does not have a family medical history of nephrolithiasis, malignancies of the genitourinary tract or hematuria.  Contrast CT on 07/17/2017 noted the adrenal glands are unremarkable. The kidneys enhance with no mass or calculus. On delayed images, pelvocaliceal systems are unremarkable and the proximal ureters appear normal in caliber. The distal ureters are normal in caliber and the urinary bladder is unremarkable although not optimally distended.  She is not a smoker.  She is not exposed to chemicals.     She underwent cystoscopy on 09/19/2017 was negative for  malignancies, but she was unable to completely empty her bladder.  She was found to have a PVR of 300 cc.  Today, she is having nocturia, intermittency and hesitancy.   Patient denies any gross hematuria, dysuria or suprapubic/flank pain.  Patient denies any fevers, chills, nausea or vomiting.  Her PVR is 135 mL.     PMH: Past Medical History:  Diagnosis Date  . Anxiety   . Cancer ([place])   . Chronic kidney disease   . Hereditary and idiopathic peripheral neuropathy 12/27/2015  . Hypertension   . Thyroid disease     Surgical History: Past Surgical History:  Procedure Laterality Date  . BACK SURGERY    . BREAST BIOPSY Left    surgical bx pt dose not remember  . BREAST EXCISIONAL BIOPSY Right 2001   + chem rad and armidex  . BREAST LUMPECTOMY    . BREAST SURGERY    . CHOLECYSTECTOMY    . ESOPHAGOGASTRODUODENOSCOPY (EGD) WITH PROPOFOL N/A 09/20/2015   Procedure: ESOPHAGOGASTRODUODENOSCOPY (EGD) WITH PROPOFOL;  Surgeon: Lollie Sails, MD;  Location: [facility] ENDOSCOPY;  Service: Endoscopy;  Laterality: N/A;    Home Medications:  Allergies as of 09/30/2017      Reactions   Ciprofloxacin Other (See Comments)   aggravates her neuropathy   Erythromycin Other (See Comments)   Gi distress   Metronidazole    Other reaction(s): Abdominal Pain   Sulfur Other (See Comments)   Doesn't remember   Tizanidine Other (See Comments)   GI upset   Penicillins Rash   Has  patient had a PCN reaction causing immediate rash, facial/tongue/throat swelling, SOB or lightheadedness with hypotension: No Has patient had a PCN reaction causing severe rash involving mucus membranes or skin necrosis: No Has patient had a PCN reaction that required hospitalization No Has patient had a PCN reaction occurring within the last 10 years: no If all of the above answers are "NO", then may proceed with Cephalosporin use.   Sulfamethoxazole Rash   all over body      Medication List        Accurate as of 09/30/17   3:47 PM. Always use your most recent med list.          acetaminophen 500 MG tablet Commonly known as:  TYLENOL Take 250 mg by mouth daily as needed.   azelastine 0.1 % nasal spray Commonly known as:  ASTELIN Place into the nose 2 (two) times daily. Use in each nostril as directed   azithromycin 500 MG tablet Commonly known as:  ZITHROMAX TAKE 1 TABLET BY MOUTH EVERY MONDAY, WEDNESDAY, AND FRIDAY   dicyclomine 20 MG tablet Commonly known as:  BENTYL Take 20 mg by mouth every 6 (six) hours.   DULoxetine 30 MG capsule Commonly known as:  CYMBALTA Take 1 capsule (30 mg total) by mouth 2 (two) times daily.   esomeprazole 40 MG capsule Commonly known as:  NEXIUM TAKE 1 CAPSULE BY MOUTH TWICE DAILY. TAKE 30 MINUTES BEFORE MEALS.   gabapentin 300 MG capsule Commonly known as:  NEURONTIN Take 1 capsule (300 mg total) by mouth at bedtime.   levothyroxine 50 MCG tablet Commonly known as:  SYNTHROID, LEVOTHROID Take 50 mcg by mouth daily before breakfast.   LORazepam 0.5 MG tablet Commonly known as:  ATIVAN 1/2  pill prn   DO NOT FILL EARLY   metoprolol succinate 50 MG 24 hr tablet Commonly known as:  TOPROL-XL Take 50 mg by mouth 2 (two) times daily.   metoprolol tartrate 50 MG tablet Commonly known as:  LOPRESSOR Take 50 mg by mouth 2 (two) times daily.   predniSONE 10 MG tablet Commonly known as:  DELTASONE Take 10 mg by mouth daily.   RABEprazole 20 MG tablet Commonly known as:  ACIPHEX Take 20 mg by mouth 2 (two) times daily.   raloxifene 60 MG tablet Commonly known as:  EVISTA Take 60 mg by mouth daily.   rifampin 300 MG capsule Commonly known as:  RIFADIN TAKE 2 CAPSULES BY MOUTH EVERY MONDAY, WEDNESDAY, AND FRIDAY   sucralfate 1 g tablet Commonly known as:  CARAFATE       Allergies:  Allergies  Allergen Reactions  . Ciprofloxacin Other (See Comments)    aggravates her neuropathy  . Erythromycin Other (See Comments)    Gi distress  .  Metronidazole     Other reaction(s): Abdominal Pain  . Sulfur Other (See Comments)    Doesn't remember  . Tizanidine Other (See Comments)    GI upset  . Penicillins Rash    Has patient had a PCN reaction causing immediate rash, facial/tongue/throat swelling, SOB or lightheadedness with hypotension: No Has patient had a PCN reaction causing severe rash involving mucus membranes or skin necrosis: No Has patient had a PCN reaction that required hospitalization No Has patient had a PCN reaction occurring within the last 10 years: no If all of the above answers are "NO", then may proceed with Cephalosporin use.  . Sulfamethoxazole Rash    all over body    Family History: Family  History  Problem Relation Age of Onset  . Depression Mother   . Brain cancer Mother     Social History:  reports that she has never smoked. She has never used smokeless tobacco. She reports that she does not drink alcohol or use drugs.  ROS: UROLOGY Frequent Urination?: No Hard to postpone urination?: No Burning/pain with urination?: No Get up at night to urinate?: Yes Leakage of urine?: No Urine stream starts and stops?: Yes Trouble starting stream?: Yes Do you have to strain to urinate?: No Blood in urine?: No Urinary tract infection?: No Sexually transmitted disease?: No Injury to kidneys or bladder?: No Painful intercourse?: No Weak stream?: No Currently pregnant?: No Vaginal bleeding?: No Last menstrual period?: n  Gastrointestinal Nausea?: No Vomiting?: No Indigestion/heartburn?: No Diarrhea?: No Constipation?: Yes  Constitutional Fever: No Night sweats?: No Weight loss?: No Fatigue?: No  Skin Skin rash/lesions?: No Itching?: No  Eyes Blurred vision?: No Double vision?: No  Ears/Nose/Throat Sore throat?: No Sinus problems?: No  Hematologic/Lymphatic Swollen glands?: No Easy bruising?: No  Cardiovascular Leg swelling?: No Chest pain?: No  Respiratory Cough?:  No Shortness of breath?: No  Endocrine Excessive thirst?: No  Musculoskeletal Back pain?: No Joint pain?: No  Neurological Headaches?: No Dizziness?: No  Psychologic Depression?: No Anxiety?: No  Physical Exam: BP (!) 155/76 (BP Location: Left Arm, Patient Position: Sitting, Cuff Size: Normal)   Pulse 85   Ht 5\' 6"  (1.676 m)   Wt 123 lb 14.4 oz (56.2 kg)   BMI 20.00 kg/m   Constitutional: Well nourished. Alert and oriented, No acute distress. HEENT: [place] AT, moist mucus membranes. Trachea midline, no masses. Cardiovascular: No clubbing, cyanosis, or edema. Respiratory: Normal respiratory effort, no increased work of breathing. Skin: No rashes, bruises or suspicious lesions. Lymph: No cervical or inguinal adenopathy. Neurologic: Grossly intact, no focal deficits, moving all 4 extremities. Psychiatric: Normal mood and affect.   Laboratory Data: Lab Results  Component Value Date   WBC 7.3 09/13/2015   HGB 14.2 09/13/2015   HCT 40.0 09/13/2015   MCV 95.5 09/13/2015   PLT 252 09/13/2015    Lab Results  Component Value Date   CREATININE 0.74 09/13/2015    No results found for: PSA  No results found for: TESTOSTERONE  No results found for: HGBA1C  Lab Results  Component Value Date   TSH 2.879 09/13/2015    No results found for: CHOL, HDL, CHOLHDL, VLDL, LDLCALC  Lab Results  Component Value Date   AST 20 09/13/2015   Lab Results  Component Value Date   ALT 12 (L) 09/13/2015   No components found for: ALKALINEPHOPHATASE No components found for: BILIRUBINTOTAL  No results found for: ESTRADIOL  I have reviewed the labs.   Pertinent Imaging: CLINICAL DATA:  Dysuria, upper abdominal pain, weight loss of 60 pounds over the last year, history of right breast carcinoma  EXAM: CT ABDOMEN AND PELVIS WITH CONTRAST  TECHNIQUE: Multidetector CT imaging of the abdomen and pelvis was performed using the standard protocol following bolus  administration of intravenous contrast.  CONTRAST:  117mL ISOVUE-300 IOPAMIDOL (ISOVUE-300) INJECTION 61%  COMPARISON:  CT abdomen and pelvis of 09/08/2015  FINDINGS: Lower chest: Chronic changes within the right middle lobe and lingula are again noted consistent with scarring and bronchiectatic change. Minimally prominent markings in the right lower lobe posteriorly may reflect inflammatory or infectious process, and there does appear to be a small right pleural effusion present. Cardiomegaly is stable.  Hepatobiliary: The liver  enhances with no focal abnormality and no ductal dilatation is seen. Surgical clips are present from prior cholecystectomy. The contours the liver are slightly nodular. Is there any clinical suspicion of cirrhosis?  Pancreas: The pancreas is normal in size and the pancreatic duct is not dilated.  Spleen: The spleen is unremarkable although a few calcifications are present suggesting prior granulomatous disease.  Adrenals/Urinary Tract: The adrenal glands are unremarkable. The kidneys enhance with no mass or calculus. On delayed images, pelvocaliceal systems are unremarkable and the proximal ureters appear normal in caliber. The distal ureters are normal in caliber and the urinary bladder is unremarkable although not optimally distended.  Stomach/Bowel: The stomach is not well distended making assessment very difficult. No gross abnormality is evident. Prominence of the mucosa of the proximal stomach cannot be excluded on this exam. In view of the patient's history upper GI or endoscopy would be recommended no abnormality of the small bowel is seen. Multiple diverticula are noted diffusely throughout the colon. No diverticulitis is seen. There is a moderate amount of feces throughout the colon as well. The terminal ileum is unremarkable, and the appendix is not visualized but no inflammatory process is seen within the right lower  quadrant.  Vascular/Lymphatic: The abdominal aorta is normal in caliber. No adenopathy is seen.  Reproductive: The uterus is normal in size for age. No adnexal lesion is seen. No pelvic wall adenopathy is noted.  Other: Poorly defined opacity in the left buttocks may be due to injection. Correlate clinically. No abdominal wall hernia is seen.  Musculoskeletal: The lumbar vertebrae are in normal alignment with mild degenerative disc disease at L5-S1.  IMPRESSION: 1. No definite explanation for the patient's symptoms is seen. However the mucosa of the proximal stomach could be prominent although poorly distended on the current study and further assessment with upper GI or endoscopy is recommended. 2. Small right pleural effusion. Chronic changes involving the right middle lobe and lingula with scarring and bronchiectatic change. 3. Multiple colonic diverticula.   Electronically Signed   By: Ivar Drape M.D.   On: 07/17/2017 16:17       Assessment & Plan:    1. History of hematuria Hematuria work up completed in 08/2017 -negative for malignancies No report of gross hematuria  UA today was positive for 11-30 RBC's and 11-30 WBC's.  It was sent for culture.   RTC in one year for UA - patient to report any gross hematuria in the interim     2. Vaginal atrophy Not a candidate for vaginal estrogen therapy due to history of breast cancer  Return in about 1 year (around 10/01/2018) for UA recheck .  These notes generated with voice recognition software. I apologize for typographical errors.  Zara Council, PA-C  [facility] [address] [place]  [place], [place] [address] [phone]

## 2017-09-30 ENCOUNTER — Ambulatory Visit: Payer: Medicare Other | Admitting: Urology

## 2017-09-30 ENCOUNTER — Encounter: Payer: Self-pay | Admitting: Urology

## 2017-09-30 VITALS — BP 155/76 | HR 85 | Ht 66.0 in | Wt 123.9 lb

## 2017-09-30 DIAGNOSIS — Z87448 Personal history of other diseases of urinary system: Secondary | ICD-10-CM

## 2017-09-30 DIAGNOSIS — R300 Dysuria: Secondary | ICD-10-CM | POA: Diagnosis not present

## 2017-09-30 DIAGNOSIS — N952 Postmenopausal atrophic vaginitis: Secondary | ICD-10-CM | POA: Diagnosis not present

## 2017-09-30 LAB — URINALYSIS, COMPLETE
Bilirubin, UA: NEGATIVE
Glucose, UA: NEGATIVE
Ketones, UA: NEGATIVE
Nitrite, UA: NEGATIVE
Specific Gravity, UA: 1.015 (ref 1.005–1.030)
Urobilinogen, Ur: 0.2 mg/dL (ref 0.2–1.0)
pH, UA: 6 (ref 5.0–7.5)

## 2017-09-30 LAB — MICROSCOPIC EXAMINATION: Epithelial Cells (non renal): >10 /hpf — ABNORMAL HIGH (ref 0–10)

## 2017-10-01 ENCOUNTER — Other Ambulatory Visit: Payer: Medicare Other

## 2017-10-01 ENCOUNTER — Telehealth: Payer: Self-pay

## 2017-10-01 DIAGNOSIS — Z87448 Personal history of other diseases of urinary system: Secondary | ICD-10-CM

## 2017-10-01 NOTE — Telephone Encounter (Signed)
Pt husband will bring urine sample this afternoon.

## 2017-10-01 NOTE — Progress Notes (Unsigned)
urin

## 2017-10-01 NOTE — Telephone Encounter (Signed)
-----   Message from Nori Riis, PA-C sent at 09/30/2017  8:02 PM EDT ----- Please have patient bring in an urine for culture.

## 2017-10-04 LAB — CULTURE, URINE COMPREHENSIVE

## 2017-10-06 ENCOUNTER — Encounter: Payer: Self-pay | Admitting: Psychiatry

## 2017-10-06 ENCOUNTER — Telehealth: Payer: Self-pay

## 2017-10-06 ENCOUNTER — Other Ambulatory Visit: Payer: Self-pay

## 2017-10-06 ENCOUNTER — Ambulatory Visit: Payer: Medicare Other | Admitting: Psychiatry

## 2017-10-06 VITALS — BP 151/81 | HR 84 | Temp 97.8°F | Wt 125.8 lb

## 2017-10-06 DIAGNOSIS — F411 Generalized anxiety disorder: Secondary | ICD-10-CM

## 2017-10-06 DIAGNOSIS — F330 Major depressive disorder, recurrent, mild: Secondary | ICD-10-CM | POA: Diagnosis not present

## 2017-10-06 MED ORDER — DULOXETINE HCL 30 MG PO CPEP: 30.0000 mg | ORAL_CAPSULE | Freq: Two times a day (BID) | ORAL | 2 refills | Status: DC

## 2017-10-06 NOTE — Telephone Encounter (Signed)
-----   Message from Nori Riis, PA-C sent at 10/05/2017  4:56 PM EDT ----- Please let Mrs. Gossen know that her urine culture was negative.

## 2017-10-06 NOTE — Telephone Encounter (Signed)
Patient notified

## 2017-10-06 NOTE — Progress Notes (Signed)
Psychiatric MD Follow up Note   Patient Identification: Christine Vaughan MRN:  [id number] Date of Evaluation:  10/06/2017 Referral Source: PCP Chief Complaint:   Chief Complaint    Follow-up; Medication Refill     Visit Diagnosis:    ICD-10-CM   1. Mild episode of recurrent major depressive disorder ([place]) F33.0   2. GAD (generalized anxiety disorder) F41.1     History of Present Illness:    Patient is a 84 year old married female who presented for follow-up. Patient was asking about her lorazepam and how many times she can refill her medication.  She still has her prescription which was given to her in June and has a medication bottle which she has filled in July.  She remains focused on her medications.  She was also asking about her Cymbalta and reported that she wants to go higher on the dose of the medication as she feels depressed in the morning.  Patient has nonspecific complaints and remains focused on her medications.  Discussed with her about her medications and advised her not to go higher on the dose of her medications.  She stated that she went to see her primary care physician and she also called her neurologist office about her Neurontin dose.  It was decreased at her last appointment.  She was confused about her dosage.  She remained focused on about her medications throughout the interview.    No acute issues noted at this time.  She denied having any suicidal homicidal ideations or plans.  She denied having any perceptual disturbances.    She is advised to take lorazepam only half a pill on a as needed basis.    Associated Signs/Symptoms: Depression Symptoms:  depressed mood, fatigue, anxiety, (Hypo) Manic Symptoms:  none Anxiety Symptoms:  Excessive Worry, Psychotic Symptoms:  none PTSD Symptoms: Negative NA  Past Psychiatric History:   Patient reported that she has tried some medications in the past but is unable to recall the names. She does not have any  history of admission to a psychiatric hospital.  Previous Psychotropic Medications:  Unable to remember the names.  Substance Abuse History in the last 12 months:  No.  Consequences of Substance Abuse: Negative NA  Past Medical History:  Past Medical History:  Diagnosis Date  . Anxiety   . Cancer ([place])   . Chronic kidney disease   . Hereditary and idiopathic peripheral neuropathy 12/27/2015  . Hypertension   . Thyroid disease     Past Surgical History:  Procedure Laterality Date  . BACK SURGERY    . BREAST BIOPSY Left    surgical bx pt dose not remember  . BREAST EXCISIONAL BIOPSY Right 2001   + chem rad and armidex  . BREAST LUMPECTOMY    . BREAST SURGERY    . CHOLECYSTECTOMY    . ESOPHAGOGASTRODUODENOSCOPY (EGD) WITH PROPOFOL N/A 09/20/2015   Procedure: ESOPHAGOGASTRODUODENOSCOPY (EGD) WITH PROPOFOL;  Surgeon: Lollie Sails, MD;  Location: [facility] ENDOSCOPY;  Service: Endoscopy;  Laterality: N/A;    Family Psychiatric History: Depression in her mother. Family History:  Family History  Problem Relation Age of Onset  . Depression Mother   . Brain cancer Mother     Social History:   Social History   Socioeconomic History  . Marital status: Married    Spouse name: Not on file  . Number of children: 4  . Years of education: Not on file  . Highest education level: Not on file  Occupational History  .  Occupation: Retired  Scientific laboratory technician  . Financial resource strain: Not on file  . Food insecurity:    Worry: Not on file    Inability: Not on file  . Transportation needs:    Medical: Not on file    Non-medical: Not on file  Tobacco Use  . Smoking status: Never Smoker  . Smokeless tobacco: Never Used  Substance and Sexual Activity  . Alcohol use: No  . Drug use: No  . Sexual activity: Not Currently  Lifestyle  . Physical activity:    Days per week: Not on file    Minutes per session: Not on file  . Stress: Not on file  Relationships  . Social  connections:    Talks on phone: Not on file    Gets together: Not on file    Attends religious service: Not on file    Active member of club or organization: Not on file    Attends meetings of clubs or organizations: Not on file    Relationship status: Not on file  Other Topics Concern  . Not on file  Social History Narrative   Lives at home w/ her husband   Right-hand   Caffeine: none    Additional Social History: She lives with her husband. She has 4 daughters and they are very supportive.  Allergies:   Allergies  Allergen Reactions  . Ciprofloxacin Other (See Comments)    aggravates her neuropathy  . Erythromycin Other (See Comments)    Gi distress  . Metronidazole     Other reaction(s): Abdominal Pain  . Sulfur Other (See Comments)    Doesn't remember  . Tizanidine Other (See Comments)    GI upset  . Penicillins Rash    Has patient had a PCN reaction causing immediate rash, facial/tongue/throat swelling, SOB or lightheadedness with hypotension: No Has patient had a PCN reaction causing severe rash involving mucus membranes or skin necrosis: No Has patient had a PCN reaction that required hospitalization No Has patient had a PCN reaction occurring within the last 10 years: no If all of the above answers are "NO", then may proceed with Cephalosporin use.  . Sulfamethoxazole Rash    all over body    Metabolic Disorder Labs: No results found for: HGBA1C, MPG No results found for: PROLACTIN No results found for: CHOL, TRIG, HDL, CHOLHDL, VLDL, LDLCALC   Current Medications: Current Outpatient Medications  Medication Sig Dispense Refill  . acetaminophen (TYLENOL) 500 MG tablet Take 250 mg by mouth daily as needed.     [name] azelastine (ASTELIN) 0.1 % nasal spray Place into the nose 2 (two) times daily. Use in each nostril as directed    . azithromycin (ZITHROMAX) 500 MG tablet TAKE 1 TABLET BY MOUTH EVERY MONDAY, WEDNESDAY, AND FRIDAY  11  . dicyclomine (BENTYL) 20 MG  tablet Take 20 mg by mouth every 6 (six) hours.    . DULoxetine (CYMBALTA) 30 MG capsule Take 1 capsule (30 mg total) by mouth 2 (two) times daily. 60 capsule 2  . esomeprazole (NEXIUM) 40 MG capsule TAKE 1 CAPSULE BY MOUTH TWICE DAILY. TAKE 30 MINUTES BEFORE MEALS.    [name] gabapentin (NEURONTIN) 300 MG capsule Take 1 capsule (300 mg total) by mouth at bedtime. 30 capsule 11  . levothyroxine (SYNTHROID, LEVOTHROID) 50 MCG tablet Take 50 mcg by mouth daily before breakfast.    . LORazepam (ATIVAN) 0.5 MG tablet 1/2  pill prn   DO NOT FILL EARLY 15 tablet 1  .  metoprolol (LOPRESSOR) 50 MG tablet Take 50 mg by mouth 2 (two) times daily.    . metoprolol succinate (TOPROL-XL) 50 MG 24 hr tablet Take 50 mg by mouth 2 (two) times daily.  1  . predniSONE (DELTASONE) 10 MG tablet Take 10 mg by mouth daily.  1  . RABEprazole (ACIPHEX) 20 MG tablet Take 20 mg by mouth 2 (two) times daily.    . raloxifene (EVISTA) 60 MG tablet Take 60 mg by mouth daily.    . rifampin (RIFADIN) 300 MG capsule TAKE 2 CAPSULES BY MOUTH EVERY MONDAY, WEDNESDAY, AND FRIDAY  11  . sucralfate (CARAFATE) 1 g tablet      Current Facility-Administered Medications  Medication Dose Route Frequency Provider Last Rate Last Dose  . ciprofloxacin (CIPRO) tablet 500 mg  500 mg Oral Once Stoioff, Scott C, MD      . lidocaine (XYLOCAINE) 2 % jelly 1 application  1 application Urethral Once Stoioff, Scott C, MD        Neurologic: Headache: No Seizure: No Paresthesias:No  Musculoskeletal: Strength & Muscle Tone: decreased Gait & Station: normal Patient leans: N/A  Psychiatric Specialty Exam: ROS  Blood pressure (!) 151/81, pulse 84, temperature 97.8 F (36.6 C), temperature source Oral, weight 125 lb 12.8 oz (57.1 kg).Body mass index is 20.3 kg/m.  General Appearance: Casual  Eye Contact:  Fair  Speech:  Slow  Volume:  Normal  Mood:  Anxious  Affect:  Congruent  Thought Process:  Coherent and Goal Directed  Orientation:   Full (Time, Place, and Person)  Thought Content:  WDL  Suicidal Thoughts:  No  Homicidal Thoughts:  No  Memory:  Immediate;   Fair Recent;   Fair  Judgement:  Fair  Insight:  Fair  Psychomotor Activity:  Normal  Concentration:  Concentration: Fair and Attention Span: Fair  Recall:  [employer] of Knowledge:Fair  Language: Fair  Akathisia:  No  Handed:  Right  AIMS (if indicated):    Assets:  Communication Skills Desire for Improvement Physical Health Social Support  ADL's:  Intact  Cognition: WNL  Sleep:      Treatment Plan Summary: Medication management   Discussed with patient what the medications at length. I will adjust her medications as follows  Ativan 0.25mg  po qhs - pt has prescription whhic she filled on 7/18 and she has another prescription written in June which has not been filled with 1 refilled.  She will not be given another refilled for another 2-3 months.  Continue Cymbalta 30 mg p.o. twice daily- no change in dose.  Advised her about the side effects of medication and she demonstrated understanding   <I F pt calls for med changes...please do not change her meds...she has enough supply ...she does not need refills and will follow up as scheduled>    More than 50% of the time spent in psychoeducation, counseling and coordination of care.    This note was generated in part or whole with voice recognition software. Voice regonition is usually quite accurate but there are transcription errors that can and very often do occur. I apologize for any typographical errors that were not detected and corrected.    Rainey Pines, MD 8/12/20191:47 PM

## 2017-10-07 ENCOUNTER — Ambulatory Visit: Payer: Medicare Other

## 2017-10-15 ENCOUNTER — Telehealth: Payer: Self-pay | Admitting: Urology

## 2017-10-15 NOTE — Telephone Encounter (Signed)
Patient notified

## 2017-10-15 NOTE — Telephone Encounter (Signed)
Christine Vaughan had blood in her urine, but her culture was negative.  She was her CT and cysto.  I would have her see her PCP for a pelvic exam to make sure the blood is not coming from her uterus.

## 2017-10-20 ENCOUNTER — Ambulatory Visit: Payer: Medicare Other | Admitting: Psychiatry

## 2017-10-21 ENCOUNTER — Ambulatory Visit
Admission: RE | Admit: 2017-10-21 | Discharge: 2017-10-21 | Disposition: A | Discharge status: Home / Self Care | Payer: Medicare Other | Source: Ambulatory Visit | Attending: Specialist | Admitting: Specialist

## 2017-10-21 ENCOUNTER — Other Ambulatory Visit: Payer: Self-pay | Admitting: Specialist

## 2017-10-21 DIAGNOSIS — I700 Atherosclerosis of aorta: Secondary | ICD-10-CM | POA: Insufficient documentation

## 2017-10-21 DIAGNOSIS — J479 Bronchiectasis, uncomplicated: Secondary | ICD-10-CM | POA: Insufficient documentation

## 2017-10-21 DIAGNOSIS — I2510 Atherosclerotic heart disease of native coronary artery without angina pectoris: Secondary | ICD-10-CM | POA: Diagnosis not present

## 2017-10-21 DIAGNOSIS — J849 Interstitial pulmonary disease, unspecified: Secondary | ICD-10-CM

## 2017-11-17 ENCOUNTER — Other Ambulatory Visit: Payer: Self-pay

## 2017-11-17 ENCOUNTER — Ambulatory Visit: Payer: Medicare Other | Admitting: Psychiatry

## 2017-11-17 ENCOUNTER — Encounter: Payer: Self-pay | Admitting: Psychiatry

## 2017-11-17 VITALS — BP 130/76 | HR 77 | Temp 97.5°F

## 2017-11-17 DIAGNOSIS — F411 Generalized anxiety disorder: Secondary | ICD-10-CM | POA: Diagnosis not present

## 2017-11-17 DIAGNOSIS — F330 Major depressive disorder, recurrent, mild: Secondary | ICD-10-CM

## 2017-11-17 MED ORDER — GABAPENTIN 100 MG PO CAPS: 100.0000 mg | ORAL_CAPSULE | Freq: Every day | ORAL | 6 refills | Status: DC

## 2017-11-17 MED ORDER — LORAZEPAM 0.5 MG PO TABS: ORAL_TABLET | ORAL | 1 refills | Status: DC

## 2017-11-17 NOTE — Progress Notes (Signed)
Psychiatric MD Follow up Note   Patient Identification: Christine Vaughan MRN:  [id number] Date of Evaluation:  11/17/2017 Referral Source: PCP Chief Complaint:    Visit Diagnosis:    ICD-10-CM   1. Mild episode of recurrent major depressive disorder ([place]) F33.0   2. GAD (generalized anxiety disorder) F41.1     History of Present Illness:    Patient is a 84 year old married female who presented for follow-up. Patient appeared apprehensive during the interview.  She reported that she was recently started on Cipro due to her lung infection.  She was concerned about the medication as she reported that it is causing her neuropathy and weakness.  She has discussed with her PCP about the medication and they have told her that she will not improve if she will not continue her medication.  She has taken it almost for the past 20 days and today will be her last day.  She has stopped taking her Cymbalta starting her medications without discussing.  She reported that it was advised on the medication flyer not to take Cymbalta along with the Cipro.  She reported that she takes lorazepam only on a as needed basis and still has 2 pills left from her prior prescription.  She has not refilled her last prescription and was showing me her prescription which was written in June.  She stated that she lives with her husband who drove her to the  appointment .  She appeared weak and tired during the interview.  She currently denied having any suicidal homicidal ideations or plans.  Appeared anxious during the interview..  She reported that she feels tired but continue her medications as prescribed.         Associated Signs/Symptoms: Depression Symptoms:  depressed mood, fatigue, anxiety, (Hypo) Manic Symptoms:  none Anxiety Symptoms:  Excessive Worry, Psychotic Symptoms:  none PTSD Symptoms: Negative NA  Past Psychiatric History:   Patient reported that she has tried some medications in the past but  is unable to recall the names. She does not have any history of admission to a psychiatric hospital.  Previous Psychotropic Medications:  Unable to remember the names.  Substance Abuse History in the last 12 months:  No.  Consequences of Substance Abuse: Negative NA  Past Medical History:  Past Medical History:  Diagnosis Date  . Anxiety   . Cancer ([place])   . Chronic kidney disease   . Hereditary and idiopathic peripheral neuropathy 12/27/2015  . Hypertension   . Thyroid disease     Past Surgical History:  Procedure Laterality Date  . BACK SURGERY    . BREAST BIOPSY Left    surgical bx pt dose not remember  . BREAST EXCISIONAL BIOPSY Right 2001   + chem rad and armidex  . BREAST LUMPECTOMY    . BREAST SURGERY    . CHOLECYSTECTOMY    . ESOPHAGOGASTRODUODENOSCOPY (EGD) WITH PROPOFOL N/A 09/20/2015   Procedure: ESOPHAGOGASTRODUODENOSCOPY (EGD) WITH PROPOFOL;  Surgeon: Lollie Sails, MD;  Location[facility] ENDOSCOPY;  Service: Endoscopy;  Laterality: N/A;    Family Psychiatric History: Depression in her mother. Family History:  Family History  Problem Relation Age of Onset  . Depression Mother   . Brain cancer Mother     Social History:   Social History   Socioeconomic History  . Marital status: Married    Spouse name: Not on file  . Number of children: 4  . Years of education: Not on file  . Highest education level:  Not on file  Occupational History  . Occupation: Retired  Scientific laboratory technician  . Financial resource strain: Not on file  . Food insecurity:    Worry: Not on file    Inability: Not on file  . Transportation needs:    Medical: Not on file    Non-medical: Not on file  Tobacco Use  . Smoking status: Never Smoker  . Smokeless tobacco: Never Used  Substance and Sexual Activity  . Alcohol use: No  . Drug use: No  . Sexual activity: Not Currently  Lifestyle  . Physical activity:    Days per week: Not on file    Minutes per session: Not on file  .  Stress: Not on file  Relationships  . Social connections:    Talks on phone: Not on file    Gets together: Not on file    Attends religious service: Not on file    Active member of club or organization: Not on file    Attends meetings of clubs or organizations: Not on file    Relationship status: Not on file  Other Topics Concern  . Not on file  Social History Narrative   Lives at home w/ her husband   Right-hand   Caffeine: none    Additional Social History: She lives with her husband. She has 4 daughters and they are very supportive.  Allergies:   Allergies  Allergen Reactions  . Ciprofloxacin Other (See Comments)    aggravates her neuropathy  . Erythromycin Other (See Comments)    Gi distress  . Metronidazole     Other reaction(s): Abdominal Pain  . Sulfur Other (See Comments)    Doesn't remember  . Tizanidine Other (See Comments)    GI upset  . Penicillins Rash    Has patient had a PCN reaction causing immediate rash, facial/tongue/throat swelling, SOB or lightheadedness with hypotension: No Has patient had a PCN reaction causing severe rash involving mucus membranes or skin necrosis: No Has patient had a PCN reaction that required hospitalization No Has patient had a PCN reaction occurring within the last 10 years: no If all of the above answers are "NO", then may proceed with Cephalosporin use.  . Sulfamethoxazole Rash    all over body    Metabolic Disorder Labs: No results found for: HGBA1C, MPG No results found for: PROLACTIN No results found for: CHOL, TRIG, HDL, CHOLHDL, VLDL, LDLCALC   Current Medications: Current Outpatient Medications  Medication Sig Dispense Refill  . acetaminophen (TYLENOL) 500 MG tablet Take 250 mg by mouth daily as needed.     [name] azelastine (ASTELIN) 0.1 % nasal spray Place into the nose 2 (two) times daily. Use in each nostril as directed    . azithromycin (ZITHROMAX) 500 MG tablet TAKE 1 TABLET BY MOUTH EVERY MONDAY, WEDNESDAY,  AND FRIDAY  11  . dicyclomine (BENTYL) 20 MG tablet Take 20 mg by mouth every 6 (six) hours.    . DULoxetine (CYMBALTA) 30 MG capsule Take 1 capsule (30 mg total) by mouth 2 (two) times daily. 60 capsule 2  . esomeprazole (NEXIUM) 40 MG capsule TAKE 1 CAPSULE BY MOUTH TWICE DAILY. TAKE 30 MINUTES BEFORE MEALS.    [name] gabapentin (NEURONTIN) 300 MG capsule Take 1 capsule (300 mg total) by mouth at bedtime. 30 capsule 11  . levothyroxine (SYNTHROID, LEVOTHROID) 50 MCG tablet Take 50 mcg by mouth daily before breakfast.    . LORazepam (ATIVAN) 0.5 MG tablet 1/2  pill prn  DO NOT FILL EARLY 15 tablet 1  . metoprolol (LOPRESSOR) 50 MG tablet Take 50 mg by mouth 2 (two) times daily.    . metoprolol succinate (TOPROL-XL) 50 MG 24 hr tablet Take 50 mg by mouth 2 (two) times daily.  1  . predniSONE (DELTASONE) 10 MG tablet Take 10 mg by mouth daily.  1  . RABEprazole (ACIPHEX) 20 MG tablet Take 20 mg by mouth 2 (two) times daily.    . raloxifene (EVISTA) 60 MG tablet Take 60 mg by mouth daily.    . rifampin (RIFADIN) 300 MG capsule TAKE 2 CAPSULES BY MOUTH EVERY MONDAY, WEDNESDAY, AND FRIDAY  11  . sucralfate (CARAFATE) 1 g tablet      Current Facility-Administered Medications  Medication Dose Route Frequency Provider Last Rate Last Dose  . ciprofloxacin (CIPRO) tablet 500 mg  500 mg Oral Once Stoioff, Scott C, MD      . lidocaine (XYLOCAINE) 2 % jelly 1 application  1 application Urethral Once Stoioff, Ronda Fairly, MD        Neurologic: Headache: No Seizure: No Paresthesias:No  Musculoskeletal: Strength & Muscle Tone: decreased Gait & Station: normal Patient leans: N/A  Psychiatric Specialty Exam: ROS  There were no vitals taken for this visit.There is no height or weight on file to calculate BMI.  General Appearance: Casual  Eye Contact:  Fair  Speech:  Slow  Volume:  Normal  Mood:  Anxious  Affect:  Congruent  Thought Process:  Coherent and Goal Directed  Orientation:  Full (Time,  Place, and Person)  Thought Content:  WDL  Suicidal Thoughts:  No  Homicidal Thoughts:  No  Memory:  Immediate;   Fair Recent;   Fair  Judgement:  Fair  Insight:  Fair  Psychomotor Activity:  Normal  Concentration:  Concentration: Fair and Attention Span: Fair  Recall:  [employer] of Knowledge:Fair  Language: Fair  Akathisia:  No  Handed:  Right  AIMS (if indicated):    Assets:  Communication Skills Desire for Improvement Physical Health Social Support  ADL's:  Intact  Cognition: WNL  Sleep:      Treatment Plan Summary: Medication management   Discussed with patient what the medications at length. I will adjust her medications as follows  Ativan 0.25mg  po qhs -she will be given a new prescription of Ativan today as she has not filled her last prescription will be taking back and discarded.  We will decrease the dose of gabapentin 100 mg at bedtime.  Continue Cymbalta 30 mg p.o. twice daily- no change in dose.  Advised her about the side effects of medication and she demonstrated understanding   <  I F pt calls for med changes...please do not change her meds...she has enough supply ...she does not need refills and will follow up as scheduled>  Folow up in  1 month months or earlier depending on her medications    More than 50% of the time spent in psychoeducation, counseling and coordination of care.    This note was generated in part or whole with voice recognition software. Voice regonition is usually quite accurate but there are transcription errors that can and very often do occur. I apologize for any typographical errors that were not detected and corrected.    Rainey Pines, MD 9/23/201911:36 AM

## 2017-12-04 ENCOUNTER — Other Ambulatory Visit: Payer: Self-pay | Admitting: Gastroenterology

## 2017-12-04 DIAGNOSIS — R935 Abnormal findings on diagnostic imaging of other abdominal regions, including retroperitoneum: Secondary | ICD-10-CM

## 2017-12-08 ENCOUNTER — Ambulatory Visit: Payer: Medicare Other | Admitting: Psychiatry

## 2017-12-08 ENCOUNTER — Encounter: Payer: Self-pay | Admitting: Psychiatry

## 2017-12-08 ENCOUNTER — Other Ambulatory Visit: Payer: Self-pay

## 2017-12-08 VITALS — BP 150/82 | HR 71 | Temp 97.9°F

## 2017-12-08 DIAGNOSIS — F330 Major depressive disorder, recurrent, mild: Secondary | ICD-10-CM

## 2017-12-08 DIAGNOSIS — F411 Generalized anxiety disorder: Secondary | ICD-10-CM | POA: Diagnosis not present

## 2017-12-08 MED ORDER — DULOXETINE HCL 30 MG PO CPEP
30.0000 mg | ORAL_CAPSULE | Freq: Two times a day (BID) | ORAL | 2 refills | Status: DC
Start: 2017-12-08 — End: 2018-01-05

## 2017-12-08 MED ORDER — GABAPENTIN 100 MG PO CAPS: 100.0000 mg | ORAL_CAPSULE | Freq: Every day | ORAL | 6 refills | Status: DC

## 2017-12-08 NOTE — Progress Notes (Signed)
Psychiatric MD Follow up Note   Patient Identification: Christine Vaughan MRN:  [id number] Date of Evaluation:  12/08/2017 Referral Source: PCP Chief Complaint:   Chief Complaint    Follow-up; Medication Refill     Visit Diagnosis:    ICD-10-CM   1. Mild episode of recurrent major depressive disorder ([place]) F33.0   2. GAD (generalized anxiety disorder) F41.1     History of Present Illness:    Patient is a 84 year old married female who presented for follow-up in a wheelchair.  Patient appeared apprehensive during the interview.  She reported that she was recently started on Cipro due to her chronic lung infection.  She stated that the medications making her weak.  She stated that she has not refilled her Ativan prescription and has been taking it on a as needed basis at night.  She stated that she is feeling weak and has difficulty walking related to her medications.  She has several medical issues at this time.  Patient reported that her granddaughter has been helping her and she brought her for the appointment.  Patient reported that she feels that the Cymbalta is not helping her but does not want to change the medications.  She stated that she has difficult time sleeping at night and she occasionally takes Ativan.  She has supportive family.  She denied having any suicidal homicidal ideations or plans.  She denied having any perceptual disturbances.     .  She appeared weak and tired during the interview.      Associated Signs/Symptoms: Depression Symptoms:  depressed mood, fatigue, anxiety, (Hypo) Manic Symptoms:  none Anxiety Symptoms:  Excessive Worry, Psychotic Symptoms:  none PTSD Symptoms: Negative NA  Past Psychiatric History:   Patient reported that she has tried some medications in the past but is unable to recall the names. She does not have any history of admission to a psychiatric hospital.  Previous Psychotropic Medications:  Unable to remember the  names.  Substance Abuse History in the last 12 months:  No.  Consequences of Substance Abuse: Negative NA  Past Medical History:  Past Medical History:  Diagnosis Date  . Anxiety   . Cancer ([place])   . Chronic kidney disease   . Hereditary and idiopathic peripheral neuropathy 12/27/2015  . Hypertension   . Thyroid disease     Past Surgical History:  Procedure Laterality Date  . BACK SURGERY    . BREAST BIOPSY Left    surgical bx pt dose not remember  . BREAST EXCISIONAL BIOPSY Right 2001   + chem rad and armidex  . BREAST LUMPECTOMY    . BREAST SURGERY    . CHOLECYSTECTOMY    . ESOPHAGOGASTRODUODENOSCOPY (EGD) WITH PROPOFOL N/A 09/20/2015   Procedure: ESOPHAGOGASTRODUODENOSCOPY (EGD) WITH PROPOFOL;  Surgeon: Lollie Sails, MD;  Location: [facility] ENDOSCOPY;  Service: Endoscopy;  Laterality: N/A;    Family Psychiatric History: Depression in her mother. Family History:  Family History  Problem Relation Age of Onset  . Depression Mother   . Brain cancer Mother     Social History:   Social History   Socioeconomic History  . Marital status: Married    Spouse name: Not on file  . Number of children: 4  . Years of education: Not on file  . Highest education level: Not on file  Occupational History  . Occupation: Retired  Scientific laboratory technician  . Financial resource strain: Not on file  . Food insecurity:    Worry: Not on file  Inability: Not on file  . Transportation needs:    Medical: Not on file    Non-medical: Not on file  Tobacco Use  . Smoking status: Never Smoker  . Smokeless tobacco: Never Used  Substance and Sexual Activity  . Alcohol use: No  . Drug use: No  . Sexual activity: Not Currently  Lifestyle  . Physical activity:    Days per week: Not on file    Minutes per session: Not on file  . Stress: Not on file  Relationships  . Social connections:    Talks on phone: Not on file    Gets together: Not on file    Attends religious service: Not on file     Active member of club or organization: Not on file    Attends meetings of clubs or organizations: Not on file    Relationship status: Not on file  Other Topics Concern  . Not on file  Social History Narrative   Lives at home w/ her husband   Right-hand   Caffeine: none    Additional Social History: She lives with her husband. She has 4 daughters and they are very supportive.  Allergies:   Allergies  Allergen Reactions  . Ciprofloxacin Other (See Comments)    aggravates her neuropathy  . Erythromycin Other (See Comments)    Gi distress  . Metronidazole     Other reaction(s): Abdominal Pain  . Sulfur Other (See Comments)    Doesn't remember  . Tizanidine Other (See Comments)    GI upset  . Penicillins Rash    Has patient had a PCN reaction causing immediate rash, facial/tongue/throat swelling, SOB or lightheadedness with hypotension: No Has patient had a PCN reaction causing severe rash involving mucus membranes or skin necrosis: No Has patient had a PCN reaction that required hospitalization No Has patient had a PCN reaction occurring within the last 10 years: no If all of the above answers are "NO", then may proceed with Cephalosporin use.  . Sulfamethoxazole Rash    all over body    Metabolic Disorder Labs: No results found for: HGBA1C, MPG No results found for: PROLACTIN No results found for: CHOL, TRIG, HDL, CHOLHDL, VLDL, LDLCALC   Current Medications: Current Outpatient Medications  Medication Sig Dispense Refill  . acetaminophen (TYLENOL) 500 MG tablet Take 250 mg by mouth daily as needed.     [name] azelastine (ASTELIN) 0.1 % nasal spray Place into the nose 2 (two) times daily. Use in each nostril as directed    . azithromycin (ZITHROMAX) 500 MG tablet TAKE 1 TABLET BY MOUTH EVERY MONDAY, WEDNESDAY, AND FRIDAY  11  . dicyclomine (BENTYL) 20 MG tablet Take 20 mg by mouth every 6 (six) hours.    . DULoxetine (CYMBALTA) 30 MG capsule Take 1 capsule (30 mg total) by  mouth 2 (two) times daily. 60 capsule 2  . esomeprazole (NEXIUM) 40 MG capsule TAKE 1 CAPSULE BY MOUTH TWICE DAILY. TAKE 30 MINUTES BEFORE MEALS.    [name] gabapentin (NEURONTIN) 100 MG capsule Take 1 capsule (100 mg total) by mouth at bedtime. 30 capsule 6  . levothyroxine (SYNTHROID, LEVOTHROID) 50 MCG tablet Take 50 mcg by mouth daily before breakfast.    . LORazepam (ATIVAN) 0.5 MG tablet 1/2  pill prn 15 tablet 1  . metoprolol (LOPRESSOR) 50 MG tablet Take 50 mg by mouth 2 (two) times daily.    . metoprolol succinate (TOPROL-XL) 50 MG 24 hr tablet Take 50 mg by mouth 2 (  two) times daily.  1  . predniSONE (DELTASONE) 10 MG tablet Take 10 mg by mouth daily.  1  . RABEprazole (ACIPHEX) 20 MG tablet Take 20 mg by mouth 2 (two) times daily.    . raloxifene (EVISTA) 60 MG tablet Take 60 mg by mouth daily.    . rifampin (RIFADIN) 300 MG capsule TAKE 2 CAPSULES BY MOUTH EVERY MONDAY, WEDNESDAY, AND FRIDAY  11  . sucralfate (CARAFATE) 1 g tablet      Current Facility-Administered Medications  Medication Dose Route Frequency Provider Last Rate Last Dose  . ciprofloxacin (CIPRO) tablet 500 mg  500 mg Oral Once Stoioff, Scott C, MD      . lidocaine (XYLOCAINE) 2 % jelly 1 application  1 application Urethral Once Stoioff, Ronda Fairly, MD        Neurologic: Headache: No Seizure: No Paresthesias:No  Musculoskeletal: Strength & Muscle Tone: decreased Gait & Station: normal Patient leans: N/A  Psychiatric Specialty Exam: Review of Systems  Constitutional: Positive for malaise/fatigue and weight loss.  Gastrointestinal: Positive for nausea.  Musculoskeletal: Positive for myalgias.  Neurological: Positive for weakness.  Psychiatric/Behavioral: The patient is nervous/anxious.     Blood pressure (!) 150/82, pulse 71, temperature 97.9 F (36.6 C), temperature source Oral.There is no height or weight on file to calculate BMI.  General Appearance: Casual  Eye Contact:  Fair  Speech:  Slow  Volume:   Normal  Mood:  Anxious  Affect:  Congruent  Thought Process:  Coherent and Goal Directed  Orientation:  Full (Time, Place, and Person)  Thought Content:  WDL  Suicidal Thoughts:  No  Homicidal Thoughts:  No  Memory:  Immediate;   Fair Recent;   Fair  Judgement:  Fair  Insight:  Fair  Psychomotor Activity:  Normal  Concentration:  Concentration: Fair and Attention Span: Fair  Recall:  [employer] of Knowledge:Fair  Language: Fair  Akathisia:  No  Handed:  Right  AIMS (if indicated):    Assets:  Communication Skills Desire for Improvement Physical Health Social Support  ADL's:  Intact  Cognition: WNL  Sleep:      Treatment Plan Summary: Medication management   Discussed with patient what the medications at length. I will adjust her medications as follows  Ativan 0.25mg  po qhs -has refilled the prescription and new prescription will not be given at this time. Continue gabapentin 100 mg at bedtime.  Continue Cymbalta 30 mg p.o. twice daily- no change in dose.  Advised her about the side effects of medication and she demonstrated understanding   <  I F pt calls for med changes...please do not change her meds...she has enough supply ...she does not need refills and will follow up as scheduled>  Folow up in  2 month months or earlier depending on her medications    More than 50% of the time spent in psychoeducation, counseling and coordination of care.    This note was generated in part or whole with voice recognition software. Voice regonition is usually quite accurate but there are transcription errors that can and very often do occur. I apologize for any typographical errors that were not detected and corrected.    Rainey Pines, MD 10/14/201911:13 AM

## 2017-12-15 ENCOUNTER — Ambulatory Visit
Admission: RE | Admit: 2017-12-15 | Discharge: 2017-12-15 | Disposition: A | Discharge status: Home / Self Care | Payer: Medicare Other | Source: Ambulatory Visit | Attending: Gastroenterology | Admitting: Gastroenterology

## 2017-12-15 DIAGNOSIS — K219 Gastro-esophageal reflux disease without esophagitis: Secondary | ICD-10-CM | POA: Insufficient documentation

## 2017-12-15 DIAGNOSIS — R935 Abnormal findings on diagnostic imaging of other abdominal regions, including retroperitoneum: Secondary | ICD-10-CM | POA: Diagnosis present

## 2017-12-22 ENCOUNTER — Telehealth: Payer: Self-pay

## 2017-12-22 NOTE — Telephone Encounter (Signed)
pt wants to increase her gabapentin to 2 a day instead of one and she also want to come off the duloxetine because of her eye sight.

## 2017-12-24 NOTE — Telephone Encounter (Signed)
Ask her if she wants to wait till she sees Dr.Faheem or if she wants to see me and we can take a look at her medications. But if she is having serious side effects she needs to stop the medication

## 2017-12-24 NOTE — Telephone Encounter (Signed)
pt called again and left message that she needs to change her duloxetine because it is making her eyesight worse.  this is a dr.. faheem pt but dr Gretel Acre did not answer her messages.

## 2017-12-29 ENCOUNTER — Other Ambulatory Visit: Payer: Self-pay | Admitting: Psychiatry

## 2017-12-29 NOTE — Telephone Encounter (Signed)
Ask her to stop her medication and follow up as scheduled

## 2017-12-29 NOTE — Telephone Encounter (Signed)
pt called left a message that she needs something to take for depression. pt states she to take the place of the duloxetine and that she needs gabapentin bid 100mg  . dr. Gretel Acre pt

## 2017-12-30 NOTE — Telephone Encounter (Signed)
Pt states she just dont want to stop she wants to taper off.  Pt was given an appt next Monday  01-05-18 to speak with dr. Gretel Acre about this.

## 2017-12-30 NOTE — Telephone Encounter (Signed)
Per Dr.Faheem's notes from yesterday - she asked her to stop medication and follow up as scheduled. Pt was given gabapentin with refills per Dr.Faheem - she does not need it yet . I cannot start another antidepressant with out evaluating this patient ,since she is not familiar to me. Please ask her to follow up with Dr.Faheem

## 2018-01-05 ENCOUNTER — Ambulatory Visit: Payer: Medicare Other | Admitting: Psychiatry

## 2018-01-05 ENCOUNTER — Encounter: Payer: Self-pay | Admitting: Psychiatry

## 2018-01-05 DIAGNOSIS — F411 Generalized anxiety disorder: Secondary | ICD-10-CM

## 2018-01-05 DIAGNOSIS — F331 Major depressive disorder, recurrent, moderate: Secondary | ICD-10-CM

## 2018-01-05 MED ORDER — SERTRALINE HCL 25 MG PO TABS: 25.0000 mg | ORAL_TABLET | Freq: Every day | ORAL | 1 refills | Status: DC

## 2018-01-05 MED ORDER — GABAPENTIN 100 MG PO CAPS: 200.0000 mg | ORAL_CAPSULE | Freq: Every day | ORAL | 6 refills | Status: DC

## 2018-01-05 NOTE — Progress Notes (Signed)
Psychiatric MD Follow up Note   Patient Identification: Christine Vaughan MRN:  [id number] Date of Evaluation:  01/05/2018 Referral Source: PCP Chief Complaint:    Visit Diagnosis:    ICD-10-CM   1. MDD (major depressive disorder), recurrent episode, moderate (HCC) F33.1   2. GAD (generalized anxiety disorder) F41.1     History of Present Illness:    Patient is a 84 year old married female who presented for follow-up in a wheelchair.  Patient appeared apprehensive during the interview.  She reported that she did not stop taking the Cymbalta although she was advised to do so a week ago by the telephone conversation.  She reported that she continued to feel depressed and is not interested in the holidays.  She reported that she has been reading about the adverse effects of the Cymbalta.  We discussed about the medications.  She reported that she wants to try some other medication.  She has taken the Wellbutrin in the past.  She reported that she wants to start taking the Wellbutrin again although she is not taking or eating anything at this time.  Patient reported that her husband is supportive.  She sleeps poorly at home.  We discussed about starting the Zoloft and she agreed with the plan.  She reported that her appetite is poor at this time.  She denied having any suicidal homicidal ideations or plans.   She remained focused on her medications as usual.   Pt has filled a bottle of lorazepam which was prescribed to her and stated that she does not take them on a regular basis.  She does not need any more lorazepam prescriptions.       Associated Signs/Symptoms: Depression Symptoms:  depressed mood, fatigue, anxiety, (Hypo) Manic Symptoms:  none Anxiety Symptoms:  Excessive Worry, Psychotic Symptoms:  none PTSD Symptoms: Negative NA  Past Psychiatric History:   Patient reported that she has tried some medications in the past but is unable to recall the names. She does not have  any history of admission to a psychiatric hospital.  Previous Psychotropic Medications:  Unable to remember the names.  Substance Abuse History in the last 12 months:  No.  Consequences of Substance Abuse: Negative NA  Past Medical History:  Past Medical History:  Diagnosis Date  . Anxiety   . Cancer ([place])   . Chronic kidney disease   . Hereditary and idiopathic peripheral neuropathy 12/27/2015  . Hypertension   . Thyroid disease     Past Surgical History:  Procedure Laterality Date  . BACK SURGERY    . BREAST BIOPSY Left    surgical bx pt dose not remember  . BREAST EXCISIONAL BIOPSY Right 2001   + chem rad and armidex  . BREAST LUMPECTOMY    . BREAST SURGERY    . CHOLECYSTECTOMY    . ESOPHAGOGASTRODUODENOSCOPY (EGD) WITH PROPOFOL N/A 09/20/2015   Procedure: ESOPHAGOGASTRODUODENOSCOPY (EGD) WITH PROPOFOL;  Surgeon: Lollie Sails, MD;  Location: [facility] ENDOSCOPY;  Service: Endoscopy;  Laterality: N/A;    Family Psychiatric History: Depression in her mother. Family History:  Family History  Problem Relation Age of Onset  . Depression Mother   . Brain cancer Mother     Social History:   Social History   Socioeconomic History  . Marital status: Married    Spouse name: Not on file  . Number of children: 4  . Years of education: Not on file  . Highest education level: Not on file  Occupational History  .  Occupation: Retired  Scientific laboratory technician  . Financial resource strain: Not on file  . Food insecurity:    Worry: Not on file    Inability: Not on file  . Transportation needs:    Medical: Not on file    Non-medical: Not on file  Tobacco Use  . Smoking status: Never Smoker  . Smokeless tobacco: Never Used  Substance and Sexual Activity  . Alcohol use: No  . Drug use: No  . Sexual activity: Not Currently  Lifestyle  . Physical activity:    Days per week: Not on file    Minutes per session: Not on file  . Stress: Not on file  Relationships  . Social  connections:    Talks on phone: Not on file    Gets together: Not on file    Attends religious service: Not on file    Active member of club or organization: Not on file    Attends meetings of clubs or organizations: Not on file    Relationship status: Not on file  Other Topics Concern  . Not on file  Social History Narrative   Lives at home w/ her husband   Right-hand   Caffeine: none    Additional Social History: She lives with her husband. She has 4 daughters and they are very supportive.  Allergies:   Allergies  Allergen Reactions  . Ciprofloxacin Other (See Comments)    aggravates her neuropathy  . Erythromycin Other (See Comments)    Gi distress  . Metronidazole     Other reaction(s): Abdominal Pain  . Sulfur Other (See Comments)    Doesn't remember  . Tizanidine Other (See Comments)    GI upset  . Penicillins Rash    Has patient had a PCN reaction causing immediate rash, facial/tongue/throat swelling, SOB or lightheadedness with hypotension: No Has patient had a PCN reaction causing severe rash involving mucus membranes or skin necrosis: No Has patient had a PCN reaction that required hospitalization No Has patient had a PCN reaction occurring within the last 10 years: no If all of the above answers are "NO", then may proceed with Cephalosporin use.  . Sulfamethoxazole Rash    all over body    Metabolic Disorder Labs: No results found for: HGBA1C, MPG No results found for: PROLACTIN No results found for: CHOL, TRIG, HDL, CHOLHDL, VLDL, LDLCALC   Current Medications: Current Outpatient Medications  Medication Sig Dispense Refill  . acetaminophen (TYLENOL) 500 MG tablet Take 250 mg by mouth daily as needed.     [name] azelastine (ASTELIN) 0.1 % nasal spray Place into the nose 2 (two) times daily. Use in each nostril as directed    . azithromycin (ZITHROMAX) 500 MG tablet TAKE 1 TABLET BY MOUTH EVERY MONDAY, WEDNESDAY, AND FRIDAY  11  . dicyclomine (BENTYL) 20 MG  tablet Take 20 mg by mouth every 6 (six) hours.    [name] esomeprazole (NEXIUM) 40 MG capsule TAKE 1 CAPSULE BY MOUTH TWICE DAILY. TAKE 30 MINUTES BEFORE MEALS.    [name] gabapentin (NEURONTIN) 100 MG capsule Take 2 capsules (200 mg total) by mouth at bedtime. 60 capsule 6  . levothyroxine (SYNTHROID, LEVOTHROID) 50 MCG tablet Take 50 mcg by mouth daily before breakfast.    . metoprolol (LOPRESSOR) 50 MG tablet Take 50 mg by mouth 2 (two) times daily.    . predniSONE (DELTASONE) 10 MG tablet Take 10 mg by mouth daily.  1  . rifampin (RIFADIN) 300 MG capsule TAKE 2 CAPSULES  BY MOUTH EVERY MONDAY, WEDNESDAY, AND FRIDAY  11  . sertraline (ZOLOFT) 25 MG tablet Take 1 tablet (25 mg total) by mouth daily. 30 tablet 1   Current Facility-Administered Medications  Medication Dose Route Frequency Provider Last Rate Last Dose  . ciprofloxacin (CIPRO) tablet 500 mg  500 mg Oral Once Stoioff, Scott C, MD      . lidocaine (XYLOCAINE) 2 % jelly 1 application  1 application Urethral Once Stoioff, Ronda Fairly, MD        Neurologic: Headache: No Seizure: No Paresthesias:No  Musculoskeletal: Strength & Muscle Tone: decreased Gait & Station: normal Patient leans: N/A  Psychiatric Specialty Exam: Review of Systems  Constitutional: Positive for malaise/fatigue and weight loss.  Gastrointestinal: Positive for nausea.  Musculoskeletal: Positive for myalgias.  Neurological: Positive for weakness.  Psychiatric/Behavioral: The patient is nervous/anxious.     There were no vitals taken for this visit.There is no height or weight on file to calculate BMI.  General Appearance: Casual  Eye Contact:  Fair  Speech:  Slow  Volume:  Normal  Mood:  Anxious  Affect:  Congruent  Thought Process:  Coherent and Goal Directed  Orientation:  Full (Time, Place, and Person)  Thought Content:  WDL  Suicidal Thoughts:  No  Homicidal Thoughts:  No  Memory:  Immediate;   Fair Recent;   Fair  Judgement:  Fair  Insight:  Fair   Psychomotor Activity:  Normal  Concentration:  Concentration: Fair and Attention Span: Fair  Recall:  [employer] of Knowledge:Fair  Language: Fair  Akathisia:  No  Handed:  Right  AIMS (if indicated):    Assets:  Communication Skills Desire for Improvement Physical Health Social Support  ADL's:  Intact  Cognition: WNL  Sleep:      Treatment Plan Summary: Medication management   Discussed with patient what the medications at length. I will adjust her medications as follows  Ativan 0.25mg  po qhs -has refilled the prescription and new prescription will not be given at this time. Increase  gabapentin 200 mg at bedtime. Discontinue Cymbalta. Start her on Zoloft 25 mg p.o. daily for her anxiety and she agreed with the plan.     She will come back for follow-up appointment in 3 weeks or earlier depending on her symptoms.      More than 50% of the time spent in psychoeducation, counseling and coordination of care.    This note was generated in part or whole with voice recognition software. Voice regonition is usually quite accurate but there are transcription errors that can and very often do occur. I apologize for any typographical errors that were not detected and corrected.    Rainey Pines, MD 11/11/20192:53 PM

## 2018-01-15 DIAGNOSIS — Z9989 Dependence on other enabling machines and devices: Secondary | ICD-10-CM | POA: Insufficient documentation

## 2018-01-28 ENCOUNTER — Other Ambulatory Visit: Payer: Self-pay | Admitting: Psychiatry

## 2018-02-05 ENCOUNTER — Other Ambulatory Visit
Admission: RE | Admit: 2018-02-05 | Discharge: 2018-02-05 | Disposition: A | Discharge status: Home / Self Care | Payer: Medicare Other | Source: Ambulatory Visit | Attending: Specialist | Admitting: Specialist

## 2018-02-05 DIAGNOSIS — R0789 Other chest pain: Secondary | ICD-10-CM | POA: Diagnosis present

## 2018-02-05 DIAGNOSIS — R0609 Other forms of dyspnea: Secondary | ICD-10-CM | POA: Insufficient documentation

## 2018-02-05 LAB — FIBRIN DERIVATIVES D-DIMER (ARMC ONLY): Fibrin derivatives D-dimer (ARMC): 842.3 ng/mL (FEU) — ABNORMAL HIGH (ref 0.00–499.00)

## 2018-02-06 ENCOUNTER — Ambulatory Visit
Admission: RE | Admit: 2018-02-06 | Discharge: 2018-02-06 | Disposition: A | Discharge status: Home / Self Care | Payer: Medicare Other | Source: Ambulatory Visit | Attending: Specialist | Admitting: Specialist

## 2018-02-06 ENCOUNTER — Other Ambulatory Visit: Payer: Self-pay | Admitting: Specialist

## 2018-02-06 DIAGNOSIS — R7989 Other specified abnormal findings of blood chemistry: Secondary | ICD-10-CM

## 2018-02-06 DIAGNOSIS — R0602 Shortness of breath: Secondary | ICD-10-CM | POA: Insufficient documentation

## 2018-02-06 DIAGNOSIS — R3129 Other microscopic hematuria: Secondary | ICD-10-CM

## 2018-02-06 HISTORY — DX: Pulmonary mycobacterial infection: A31.0

## 2018-02-06 HISTORY — DX: Malignant neoplasm of unspecified site of right female breast: C50.911

## 2018-02-06 MED ORDER — IOPAMIDOL (ISOVUE-370) INJECTION 76%
75.0000 mL | Freq: Once | INTRAVENOUS | Status: AC | PRN
  Administered 2018-02-06: 65 mL via INTRAVENOUS

## 2018-02-09 ENCOUNTER — Ambulatory Visit: Admission: RE | Admit: 2018-02-09 | Payer: Medicare Other | Source: Ambulatory Visit

## 2018-02-09 ENCOUNTER — Ambulatory Visit: Payer: Medicare Other | Admitting: Psychiatry

## 2018-02-09 ENCOUNTER — Encounter: Payer: Self-pay | Admitting: Psychiatry

## 2018-02-09 DIAGNOSIS — F331 Major depressive disorder, recurrent, moderate: Secondary | ICD-10-CM | POA: Diagnosis not present

## 2018-02-09 DIAGNOSIS — F411 Generalized anxiety disorder: Secondary | ICD-10-CM

## 2018-02-09 MED ORDER — GABAPENTIN 100 MG PO CAPS: 200.0000 mg | ORAL_CAPSULE | Freq: Every day | ORAL | 6 refills | Status: DC

## 2018-02-09 MED ORDER — SERTRALINE HCL 25 MG PO TABS: 25.0000 mg | ORAL_TABLET | Freq: Every day | ORAL | 1 refills | Status: DC

## 2018-02-09 NOTE — Progress Notes (Signed)
Psychiatric MD Follow up Note   Patient Identification: Christine Vaughan MRN:  [id number] Date of Evaluation:  02/09/2018 Referral Source: PCP Chief Complaint:    Visit Diagnosis:    ICD-10-CM   1. GAD (generalized anxiety disorder) F41.1   2. MDD (major depressive disorder), recurrent episode, moderate (HCC) F33.1     History of Present Illness:    Patient is a 84 year old married female who presented for follow-up. Patient that she has started improving since her medications have been adjusted.  She is doing well on the Zoloft and it has been helpful.  She is also sleeping well with the help of the gabapentin.  She was discussing in detail about her pulmonology appointment with Dr. Raul Del.  She reported that she is still undecided about her follow-up with Dr. Raul Del.  She reported that she is not following any more and will discuss with her family.  Patient reported that she is taking her medications as prescribed.  She appeared less anxious and apprehensive during the interview.  She denied having any side effects of the medications at this time.  She is able to contract for safety.  We discussed about the side effects of the medications in detail and she is agreeable to continue them as prescribed.    .       Associated Signs/Symptoms: Depression Symptoms:  depressed mood, fatigue, anxiety, (Hypo) Manic Symptoms:  none Anxiety Symptoms:  Excessive Worry, Psychotic Symptoms:  none PTSD Symptoms: Negative NA  Past Psychiatric History:   Patient reported that she has tried some medications in the past but is unable to recall the names. She does not have any history of admission to a psychiatric hospital.  Previous Psychotropic Medications:  Unable to remember the names.  Substance Abuse History in the last 12 months:  No.  Consequences of Substance Abuse: Negative NA  Past Medical History:  Past Medical History:  Diagnosis Date  . Anxiety   . Breast cancer,  right ([place]) 2001   Right Lumpectomy, chemo + rad tx's.   . Chronic kidney disease   . Hereditary and idiopathic peripheral neuropathy 12/27/2015  . Hypertension   . MAI (mycobacterium avium-intracellulare) ([place])   . Thyroid disease     Past Surgical History:  Procedure Laterality Date  . BACK SURGERY    . BREAST BIOPSY Left    surgical bx pt dose not remember  . BREAST EXCISIONAL BIOPSY Right 2001   + chem rad and armidex  . BREAST LUMPECTOMY    . BREAST SURGERY    . CHOLECYSTECTOMY    . ESOPHAGOGASTRODUODENOSCOPY (EGD) WITH PROPOFOL N/A 09/20/2015   Procedure: ESOPHAGOGASTRODUODENOSCOPY (EGD) WITH PROPOFOL;  Surgeon: Lollie Sails, MD;  Location: [facility] ENDOSCOPY;  Service: Endoscopy;  Laterality: N/A;    Family Psychiatric History: Depression in her mother. Family History:  Family History  Problem Relation Age of Onset  . Depression Mother   . Brain cancer Mother     Social History:   Social History   Socioeconomic History  . Marital status: Married    Spouse name: Not on file  . Number of children: 4  . Years of education: Not on file  . Highest education level: Not on file  Occupational History  . Occupation: Retired  Scientific laboratory technician  . Financial resource strain: Not on file  . Food insecurity:    Worry: Not on file    Inability: Not on file  . Transportation needs:    Medical: Not on file  Non-medical: Not on file  Tobacco Use  . Smoking status: Never Smoker  . Smokeless tobacco: Never Used  Substance and Sexual Activity  . Alcohol use: No  . Drug use: No  . Sexual activity: Not Currently  Lifestyle  . Physical activity:    Days per week: Not on file    Minutes per session: Not on file  . Stress: Not on file  Relationships  . Social connections:    Talks on phone: Not on file    Gets together: Not on file    Attends religious service: Not on file    Active member of club or organization: Not on file    Attends meetings of clubs or organizations:  Not on file    Relationship status: Not on file  Other Topics Concern  . Not on file  Social History Narrative   Lives at home w/ her husband   Right-hand   Caffeine: none    Additional Social History: She lives with her husband. She has 4 daughters and they are very supportive.  Allergies:   Allergies  Allergen Reactions  . Ciprofloxacin Other (See Comments)    aggravates her neuropathy  . Erythromycin Other (See Comments)    Gi distress  . Metronidazole     Other reaction(s): Abdominal Pain  . Sulfur Other (See Comments)    Doesn't remember  . Tizanidine Other (See Comments)    GI upset  . Penicillins Rash    Has patient had a PCN reaction causing immediate rash, facial/tongue/throat swelling, SOB or lightheadedness with hypotension: No Has patient had a PCN reaction causing severe rash involving mucus membranes or skin necrosis: No Has patient had a PCN reaction that required hospitalization No Has patient had a PCN reaction occurring within the last 10 years: no If all of the above answers are "NO", then may proceed with Cephalosporin use.  . Sulfamethoxazole Rash    all over body    Metabolic Disorder Labs: No results found for: HGBA1C, MPG No results found for: PROLACTIN No results found for: CHOL, TRIG, HDL, CHOLHDL, VLDL, LDLCALC   Current Medications: Current Outpatient Medications  Medication Sig Dispense Refill  . acetaminophen (TYLENOL) 500 MG tablet Take 250 mg by mouth daily as needed.     [name] azelastine (ASTELIN) 0.1 % nasal spray Place into the nose 2 (two) times daily. Use in each nostril as directed    . azithromycin (ZITHROMAX) 500 MG tablet TAKE 1 TABLET BY MOUTH EVERY MONDAY, WEDNESDAY, AND FRIDAY  11  . dicyclomine (BENTYL) 20 MG tablet Take 20 mg by mouth every 6 (six) hours.    [name] esomeprazole (NEXIUM) 40 MG capsule TAKE 1 CAPSULE BY MOUTH TWICE DAILY. TAKE 30 MINUTES BEFORE MEALS.    [name] gabapentin (NEURONTIN) 100 MG capsule Take 2 capsules (200 mg  total) by mouth at bedtime. 60 capsule 6  . levothyroxine (SYNTHROID, LEVOTHROID) 50 MCG tablet Take 50 mcg by mouth daily before breakfast.    . metoprolol (LOPRESSOR) 50 MG tablet Take 50 mg by mouth 2 (two) times daily.    . predniSONE (DELTASONE) 10 MG tablet Take 10 mg by mouth daily.  1  . rifampin (RIFADIN) 300 MG capsule TAKE 2 CAPSULES BY MOUTH EVERY MONDAY, WEDNESDAY, AND FRIDAY  11  . sertraline (ZOLOFT) 25 MG tablet Take 1 tablet (25 mg total) by mouth daily. 30 tablet 1   Current Facility-Administered Medications  Medication Dose Route Frequency Provider Last Rate Last Dose  .  ciprofloxacin (CIPRO) tablet 500 mg  500 mg Oral Once Stoioff, Scott C, MD      . lidocaine (XYLOCAINE) 2 % jelly 1 application  1 application Urethral Once Stoioff, Ronda Fairly, MD        Neurologic: Headache: No Seizure: No Paresthesias:No  Musculoskeletal: Strength & Muscle Tone: decreased Gait & Station: normal Patient leans: N/A  Psychiatric Specialty Exam: Review of Systems  Constitutional: Positive for malaise/fatigue and weight loss.  Gastrointestinal: Positive for nausea.  Musculoskeletal: Positive for myalgias.  Neurological: Positive for weakness.  Psychiatric/Behavioral: The patient is nervous/anxious.     There were no vitals taken for this visit.There is no height or weight on file to calculate BMI.  General Appearance: Casual  Eye Contact:  Fair  Speech:  Slow  Volume:  Normal  Mood:  Anxious  Affect:  Congruent  Thought Process:  Coherent and Goal Directed  Orientation:  Full (Time, Place, and Person)  Thought Content:  WDL  Suicidal Thoughts:  No  Homicidal Thoughts:  No  Memory:  Immediate;   Fair Recent;   Fair  Judgement:  Fair  Insight:  Fair  Psychomotor Activity:  Normal  Concentration:  Concentration: Fair and Attention Span: Fair  Recall:  [employer] of Knowledge:Fair  Language: Fair  Akathisia:  No  Handed:  Right  AIMS (if indicated):    Assets:   Communication Skills Desire for Improvement Physical Health Social Support  ADL's:  Intact  Cognition: WNL  Sleep:      Treatment Plan Summary: Medication management   Discussed with patient what the medications at length. I will adjust her medications as follows  Ativan 0.25mg  po qhs-has refills and she is not taking it on a regular basis.  Gabapentin 200 mg at bedtime.  Zoloft 25 mg p.o. daily for her anxiety and she agreed with the plan.     She will come back for follow-up appointment in 2 months or earlier depending on her symptoms.      More than 50% of the time spent in psychoeducation, counseling and coordination of care.    This note was generated in part or whole with voice recognition software. Voice regonition is usually quite accurate but there are transcription errors that can and very often do occur. I apologize for any typographical errors that were not detected and corrected.    Rainey Pines, MD 12/16/20192:40 PM

## 2018-03-02 ENCOUNTER — Telehealth: Payer: Self-pay

## 2018-03-02 NOTE — Telephone Encounter (Signed)
Medication management - Telephone call with pt to inform her message was received requesting Dr.Faheem approve her an increase in Sertraline. Pt. stated she feels better but still some depression and medication not lasting long enough. Requests to take "an extra one each day" or a 25 mg increase.  Agreed to send request to Dr. Gretel Acre and our office would contact patient back once a response received.

## 2018-03-02 NOTE — Telephone Encounter (Signed)
Medication management - Telephione call with pt after speaking to Dr. Gretel Acre to tell her it was okay for her to go up to 2 Sertraline 25 mg tablets per day. To try this for a few days and if no problems to then call back for a new order.  Patient stated understanding plan and will try the increased dosage and will let us know how this is working after several days to a week on the medication prior to needing a new order.

## 2018-03-02 NOTE — Telephone Encounter (Signed)
Please ask to pt make appt after increasing her meds

## 2018-03-12 ENCOUNTER — Other Ambulatory Visit: Payer: Self-pay

## 2018-03-12 ENCOUNTER — Emergency Department: Payer: Medicare Other

## 2018-03-12 ENCOUNTER — Emergency Department
Admission: EM | Admit: 2018-03-12 | Discharge: 2018-03-13 | Disposition: A | Discharge status: Home / Self Care | Payer: Medicare Other | Attending: Emergency Medicine | Admitting: Emergency Medicine

## 2018-03-12 DIAGNOSIS — Z79899 Other long term (current) drug therapy: Secondary | ICD-10-CM | POA: Insufficient documentation

## 2018-03-12 DIAGNOSIS — R079 Chest pain, unspecified: Secondary | ICD-10-CM

## 2018-03-12 DIAGNOSIS — N189 Chronic kidney disease, unspecified: Secondary | ICD-10-CM | POA: Insufficient documentation

## 2018-03-12 DIAGNOSIS — R091 Pleurisy: Secondary | ICD-10-CM | POA: Diagnosis not present

## 2018-03-12 DIAGNOSIS — R0789 Other chest pain: Secondary | ICD-10-CM | POA: Diagnosis present

## 2018-03-12 DIAGNOSIS — I129 Hypertensive chronic kidney disease with stage 1 through stage 4 chronic kidney disease, or unspecified chronic kidney disease: Secondary | ICD-10-CM | POA: Insufficient documentation

## 2018-03-12 DIAGNOSIS — E039 Hypothyroidism, unspecified: Secondary | ICD-10-CM | POA: Insufficient documentation

## 2018-03-12 LAB — COMPREHENSIVE METABOLIC PANEL
ALT: 16 U/L (ref 0–44)
AST: 23 U/L (ref 15–41)
Albumin: 4.1 g/dL (ref 3.5–5.0)
Alkaline Phosphatase: 92 U/L (ref 38–126)
Anion gap: 9 (ref 5–15)
BUN: 18 mg/dL (ref 8–23)
CO2: 25 mmol/L (ref 22–32)
Calcium: 9.2 mg/dL (ref 8.9–10.3)
Chloride: 93 mmol/L — ABNORMAL LOW (ref 98–111)
Creatinine, Ser: 0.66 mg/dL (ref 0.44–1.00)
GFR calc Af Amer: >60 mL/min (ref >60)
GFR calc non Af Amer: >60 mL/min (ref >60)
Glucose, Bld: 99 mg/dL (ref 70–99)
Potassium: 3.8 mmol/L (ref 3.5–5.1)
Sodium: 127 mmol/L — ABNORMAL LOW (ref 135–145)
Total Bilirubin: 1 mg/dL (ref 0.3–1.2)
Total Protein: 7.4 g/dL (ref 6.5–8.1)

## 2018-03-12 LAB — CBC WITH DIFFERENTIAL/PLATELET
Abs Immature Granulocytes: 0.02 10*3/uL (ref 0.00–0.07)
Basophils Absolute: 0 10*3/uL (ref 0.0–0.1)
Basophils Relative: 1 %
Eosinophils Absolute: 0.1 10*3/uL (ref 0.0–0.5)
Eosinophils Relative: 2 %
HCT: 36.5 % (ref 36.0–46.0)
Hemoglobin: 12 g/dL (ref 12.0–15.0)
Immature Granulocytes: 0 %
Lymphocytes Relative: 13 %
Lymphs Abs: 0.8 10*3/uL (ref 0.7–4.0)
MCH: 32.5 pg (ref 26.0–34.0)
MCHC: 32.9 g/dL (ref 30.0–36.0)
MCV: 98.9 fL (ref 80.0–100.0)
Monocytes Absolute: 0.6 10*3/uL (ref 0.1–1.0)
Monocytes Relative: 10 %
Neutro Abs: 4.7 10*3/uL (ref 1.7–7.7)
Neutrophils Relative %: 74 %
Platelets: 255 10*3/uL (ref 150–400)
RBC: 3.69 MIL/uL — ABNORMAL LOW (ref 3.87–5.11)
RDW: 13.2 % (ref 11.5–15.5)
WBC: 6.2 10*3/uL (ref 4.0–10.5)
nRBC: 0 % (ref 0.0–0.2)

## 2018-03-12 LAB — TROPONIN I: Troponin I: <0.03 ng/mL (ref <0.03)

## 2018-03-12 MED ORDER — SODIUM CHLORIDE 0.9 % IV BOLUS
500.0000 mL | Freq: Once | INTRAVENOUS | Status: AC
  Administered 2018-03-12: 500 mL via INTRAVENOUS

## 2018-03-12 MED ORDER — GABAPENTIN 300 MG PO CAPS
300.0000 mg | ORAL_CAPSULE | ORAL | Status: DC
  Filled 2018-03-12: qty 1

## 2018-03-12 MED ORDER — IOHEXOL 350 MG/ML SOLN
75.0000 mL | Freq: Once | INTRAVENOUS | Status: AC | PRN
  Administered 2018-03-12: 75 mL via INTRAVENOUS

## 2018-03-12 MED ORDER — TRAMADOL HCL 50 MG PO TABS
50.0000 mg | ORAL_TABLET | Freq: Once | ORAL | Status: DC
  Filled 2018-03-12: qty 1

## 2018-03-12 MED ORDER — TRAMADOL HCL 50 MG PO TABS: 50.0000 mg | ORAL_TABLET | Freq: Four times a day (QID) | ORAL | 0 refills | Status: DC | PRN

## 2018-03-12 NOTE — ED Provider Notes (Signed)
[facility] Emergency Department Provider Note  ____________________________________________  Time seen: Approximately 11:31 PM  I have reviewed the triage vital signs and the nursing notes.   HISTORY  Chief Complaint Chest Pain    HPI Christine Vaughan is a 85 y.o. female with a history of breast cancer, CKD, MAC pulmonary disease who complains of pleuritic chest pain in the right chest, radiating to the axilla and back, worse with deep inspiration, associated with shortness of breath, severe, 8/10, no alleviating factors.  Pain is been going on for approximately 4 to 6 weeks, but today feels worse than it had in the past.  Review of electronic medical record shows that a CT scan of the chest in mid December did show a few pulmonary emboli in the right lung.  Due to frequent falling, the patient was deemed too risky for anticoagulation and she declined IVC filter placement at that time.      Past Medical History:  Diagnosis Date  . Anxiety   . Breast cancer, right ([place]) 2001   Right Lumpectomy, chemo + rad tx's.   . Chronic kidney disease   . Hereditary and idiopathic peripheral neuropathy 12/27/2015  . Hypertension   . MAI (mycobacterium avium-intracellulare) ([place])   . Thyroid disease      Patient Active Problem List   Diagnosis Date Noted  . MAI (mycobacterium avium-intracellulare) infection ([place]) 06/16/2017  . Headache syndrome 12/27/2015  . Memory change 12/27/2015  . Vitamin B12 deficiency 12/27/2015  . Hereditary and idiopathic peripheral neuropathy 12/27/2015  . Abnormality of gait 12/27/2015  . Hypertension   . Health care maintenance 10/12/2014  . Mixed hyperlipidemia 10/07/2013  . Trigeminal neuralgia 09/19/2013  . Panic anxiety syndrome 09/19/2013  . Osteopenia 09/19/2013  . Hypothyroid 09/19/2013  . 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 Depression, major, in remission ([place]) 09/19/2013  . Bronchiectasis  ([place]) 09/19/2013  . History of radiation therapy 07/15/2011  . Breast cancer ([place]) 07/15/2011  . 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 Optic neuropathy 12/31/2010  . Nuclear cataract 12/31/2010  . Glaucoma suspect 12/31/2010     Past Surgical History:  Procedure Laterality Date  . BACK SURGERY    . BREAST BIOPSY Left    surgical bx pt dose not remember  . BREAST EXCISIONAL BIOPSY Right 2001   + chem rad and armidex  . BREAST LUMPECTOMY    . BREAST SURGERY    . CHOLECYSTECTOMY    . ESOPHAGOGASTRODUODENOSCOPY (EGD) WITH PROPOFOL N/A 09/20/2015   Procedure: ESOPHAGOGASTRODUODENOSCOPY (EGD) WITH PROPOFOL;  Surgeon: Lollie Sails, MD;  Location: [facility] ENDOSCOPY;  Service: Endoscopy;  Laterality: N/A;     Prior to Admission medications   Medication Sig Start Date End Date Taking? Authorizing Provider  acetaminophen (TYLENOL) 500 MG tablet Take 250 mg by mouth daily as needed.     [provider]  azelastine (ASTELIN) 0.1 % nasal spray Place into the nose 2 (two) times daily. Use in each nostril as directed    [provider]  azithromycin (ZITHROMAX) 500 MG tablet TAKE 1 TABLET BY MOUTH EVERY MONDAY, [place], AND FRIDAY 12/20/16   [provider]  dicyclomine (BENTYL) 20 MG tablet Take 20 mg by mouth every 6 (six) hours.    [provider]  esomeprazole (NEXIUM) 40 MG capsule TAKE 1 CAPSULE BY MOUTH TWICE DAILY. TAKE 30 MINUTES BEFORE MEALS. 12/24/16   [provider]  gabapentin (NEURONTIN) 100 MG capsule Take 2  capsules (200 mg total) by mouth at bedtime. 02/09/18   Rainey Pines, MD  levothyroxine (SYNTHROID, LEVOTHROID) 50 MCG tablet Take 50 mcg by mouth daily before breakfast.    [provider]  metoprolol (LOPRESSOR) 50 MG tablet Take 50 mg by mouth 2 (two) times daily.    [provider]  predniSONE (DELTASONE) 10 MG tablet Take 10 mg by mouth daily. 09/03/17   [provider]  rifampin  (RIFADIN) 300 MG capsule TAKE 2 CAPSULES BY MOUTH EVERY MONDAY, WEDNESDAY, AND FRIDAY 12/26/16   [provider]  sertraline (ZOLOFT) 25 MG tablet Take 1 tablet (25 mg total) by mouth daily. 02/09/18   Rainey Pines, MD  traMADol (ULTRAM) 50 MG tablet Take 1 tablet (50 mg total) by mouth every 6 (six) hours as needed. 03/12/18   Carrie Mew, MD     Allergies Ciprofloxacin; Erythromycin; Metronidazole; Sulfur; Tizanidine; Penicillins; and Sulfamethoxazole   Family History  Problem Relation Age of Onset  . Depression Mother   . Brain cancer Mother     Social History Social History   Tobacco Use  . Smoking status: Never Smoker  . Smokeless tobacco: Never Used  Substance Use Topics  . Alcohol use: No  . Drug use: No    Review of Systems  Constitutional:   No fever or chills.  ENT:   No sore throat. No rhinorrhea. Cardiovascular:   Positive as above chest pain without syncope. Respiratory:   Positive shortness of breath without cough. Gastrointestinal:   Negative for abdominal pain, vomiting and diarrhea.  Musculoskeletal:   Negative for focal pain or swelling All other systems reviewed and are negative except as documented above in ROS and HPI.  ____________________________________________   PHYSICAL EXAM:  VITAL SIGNS: ED Triage Vitals  Enc Vitals Group     BP 03/12/18 1619 (!) 157/77     Pulse Rate 03/12/18 1619 70     Resp 03/12/18 1619 18     Temp 03/12/18 1619 97.8 F (36.6 C)     Temp Source 03/12/18 1619 Oral     SpO2 03/12/18 1619 95 %     Weight 03/12/18 1623 123 lb (55.8 kg)     Height 03/12/18 1623 _0  (1.676 m)     Head Circumference --      Peak Flow --      Pain Score 03/12/18 1623 10     Pain Loc --      Pain Edu? --      Excl. in [place]? --     Vital signs reviewed, nursing assessments reviewed.   Constitutional:   Alert and oriented. Non-toxic appearance. Eyes:   Conjunctivae are normal. EOMI. PERRL. ENT      Head:    Normocephalic and atraumatic.      Nose:   No congestion/rhinnorhea.       Mouth/Throat:   MMM, no pharyngeal erythema. No peritonsillar mass.       Neck:   No meningismus. Full ROM. Hematological/Lymphatic/Immunilogical:   No cervical lymphadenopathy. Cardiovascular:   RRR. Symmetric bilateral radial and DP pulses.  No murmurs. Cap refill less than 2 seconds. Respiratory:   Normal respiratory effort without tachypnea/retractions. Breath sounds are clear and equal bilaterally. No wheezes/rales/rhonchi. Gastrointestinal:   Soft and nontender. Non distended. There is no CVA tenderness.  No rebound, rigidity, or guarding. Musculoskeletal:   Normal range of motion in all extremities. No joint effusions.  No lower extremity tenderness.  No edema. Neurologic:   Normal  speech and language.  Motor grossly intact. No acute focal neurologic deficits are appreciated.  Skin:    Skin is warm, dry and intact. No rash noted.  No petechiae, purpura, or bullae.  ____________________________________________    LABS (pertinent positives/negatives) (all labs ordered are listed, but only abnormal results are displayed) Labs Reviewed  COMPREHENSIVE METABOLIC PANEL - Abnormal; Notable for the following components:      Result Value   Sodium 127 (*)    Chloride 93 (*)    All other components within normal limits  CBC WITH DIFFERENTIAL/PLATELET - Abnormal; Notable for the following components:   RBC 3.69 (*)    All other components within normal limits  TROPONIN I   ____________________________________________   EKG  Interpreted by me Sinus rhythm rate of 63, left axis, normal intervals.  Poor R wave progression.  Normal ST segments and T waves.  No acute ischemic changes.  1 PVC on the strip.  ____________________________________________    RADIOLOGY  Dg Chest 2 View  Result Date: 03/12/2018 CLINICAL DATA:  85 year old with acute onset of chest pain. Small RIGHT middle lobe and lower lobe  subsegmental pulmonary emboli diagnosed on 02/06/2018. EXAM: CHEST - 2 VIEW COMPARISON:  CTA chest 02/06/2018. High-resolution CT chest 10/21/2017 and earlier. Chest x-rays 08/11/2010 and earlier. FINDINGS: Cardiac silhouette upper normal in size, unchanged. Thoracic aorta tortuous and mildly atherosclerotic, unchanged. Calcified LEFT hilar lymph nodes as noted previously. Calcified granulomata in the LEFT UPPER LOBE. Patchy airspace opacities in the RIGHT LOWER LOBE and to a lesser degree the RIGHT UPPER LOBE, similar to the prior CTs. No new pulmonary parenchymal abnormalities. Mild degenerative changes involving the thoracic spine. Surgical clips in both breasts and in the RIGHT axilla. IMPRESSION: Stable changes of chronic MAI infection. No new/acute abnormalities. Electronically Signed   By: Evangeline Dakin M.D.   On: 03/12/2018 17:04   Ct Angio Chest Pe W And/or Wo Contrast  Result Date: 03/12/2018 CLINICAL DATA:  86 year old female with chest pain. EXAM: CT ANGIOGRAPHY CHEST WITH CONTRAST TECHNIQUE: Multidetector CT imaging of the chest was performed using the standard protocol during bolus administration of intravenous contrast. Multiplanar CT image reconstructions and MIPs were obtained to evaluate the vascular anatomy. CONTRAST:  14m OMNIPAQUE IOHEXOL 350 MG/ML SOLN COMPARISON:  Chest radiograph dated 03/12/2018 and CT dated 02/06/2018 FINDINGS: Cardiovascular: Stable borderline cardiomegaly. No pericardial effusion. The thoracic aorta is grossly unremarkable for the degree of opacification. There is chronic occlusion of a segmental branch in the right middle lobe. No CT evidence of acute pulmonary artery embolus. Mediastinum/Nodes: Calcified left hilar granuloma. No new adenopathy. The esophagus is grossly unremarkable. No mediastinal fluid collection. Lungs/Pleura: Mild bilateral upper lobe and right middle lobe and lingula bronchiectatic changes. There is diffuse pulmonary nodules with tree-in-bud  appearance consistent with known MAC infection. Small confluent areas of density in the right upper lobe appear new since the prior CT and may represent worsening infection or less likely areas of pulmonary infarct. There is no pleural effusion or pneumothorax. The central airways are patent. Upper Abdomen: Calcified splenic granuloma. Left adrenal thickening. The visualized upper abdomen is otherwise unremarkable. Musculoskeletal: No chest wall abnormality. No acute or significant osseous findings. Right axillary and bilateral chest wall surgical clips. Confluent area of soft tissue density with multiple surgical/biopsy clips in the right breast similar to prior CT. Correlation with history and mammographic studies recommended. Review of the MIP images confirms the above findings. IMPRESSION: 1. No CT evidence of acute pulmonary artery  embolus. Small chronically occluded segmental branch in the right middle lobe. 2. Pulmonary nodules and bronchiectatic changes in keeping with known mass infection. Interval development of confluent nodular density in the right upper lobe which may represent worsening or superimposed infection or small areas of infarct. Electronically Signed   By: Anner Crete M.D.   On: 03/12/2018 21:54    ____________________________________________   PROCEDURES Procedures  ____________________________________________  DIFFERENTIAL DIAGNOSIS   Recurrent pulmonary emboli, progression of MAC disease, acute bronchopneumonia.    CLINICAL IMPRESSION / ASSESSMENT AND PLAN / ED COURSE  Pertinent labs & imaging results that were available during my care of the patient were reviewed by me and considered in my medical decision making (see chart for details).    Patient presents with pleuritic chest pain.  Doubt ACS dissection AAA pericarditis pericardial effusion or pulmonary edema.  Doubt pneumothorax.  Due to her known prior PEs that have not been treated, will need to repeat CTA  today to assess for progression or recurrence.  If negative, can continue usual follow-up care.  If positive will need to discuss IV heparin and IVC filter in hospital.  Clinical Course as of Mar 12 2329  Thu Mar 12, 2018  1952 DG Chest 2 View [MT]    Clinical Course User Index [MT] Ladonna Snide     ----------------------------------------- 11:33 PM on 03/12/2018 -----------------------------------------  CT scan shows no acute PE but does show some progression of her MAC disease.  Given that this is a chronic issue going on for greater than a year, oxygenation is normal, not in distress, I think that she can follow-up with her pulmonologist.  Plan to discharge home, I will start some tramadol for enhanced symptom relief until she can see her doctor.  Also referral information for vascular so she can further discuss with them the risks and benefits of placing an IVC filter for the PE that was discovered back in December.  ____________________________________________   FINAL CLINICAL IMPRESSION(S) / ED DIAGNOSES    Final diagnoses:  Pleurisy  Chest pain, unspecified type     ED Discharge Orders         Ordered    traMADol (ULTRAM) 50 MG tablet  Every 6 hours PRN     03/12/18 2331          Portions of this note were generated with dragon dictation software. Dictation errors may occur despite best attempts at proofreading.   Carrie Mew, MD 03/12/18 2336

## 2018-03-12 NOTE — ED Triage Notes (Signed)
Pt reports chest pain  Daughter reports she has blood clots "in the arteries of her lungs" - Dr Raul Del told her that she had "blood clots in my chest 3 weeks ago" C/o shortness of breath with exertion, "unstable on feet"

## 2018-03-12 NOTE — ED Notes (Signed)
Pt provided [place] sandwich tray per request; EDP gives ok.

## 2018-03-12 NOTE — ED Notes (Signed)
Pharmacy notified to send Gabapentin.

## 2018-03-12 NOTE — ED Notes (Signed)
Reviewed pt's chart & results; acuity level changed and immed bed requested from charge nurse

## 2018-03-12 NOTE — Discharge Instructions (Signed)
Your CT scan does not show any new blood clots in the lung, but does show progression of your MAC disease.  Please follow-up with your pulmonologist as soon as possible for further evaluation of this finding.

## 2018-03-12 NOTE — ED Notes (Signed)
Patient transported to CT 

## 2018-03-12 NOTE — ED Notes (Signed)
Pt ambulatory to toilet with one standby assist.

## 2018-03-12 NOTE — ED Triage Notes (Addendum)
Last note by Larene Beach, RN entered in error

## 2018-03-24 ENCOUNTER — Encounter (INDEPENDENT_AMBULATORY_CARE_PROVIDER_SITE_OTHER): Payer: Medicare Other | Admitting: Vascular Surgery

## 2018-03-24 ENCOUNTER — Ambulatory Visit (INDEPENDENT_AMBULATORY_CARE_PROVIDER_SITE_OTHER): Payer: Medicare Other

## 2018-03-24 ENCOUNTER — Other Ambulatory Visit (INDEPENDENT_AMBULATORY_CARE_PROVIDER_SITE_OTHER): Payer: Self-pay | Admitting: Nurse Practitioner

## 2018-03-24 DIAGNOSIS — Z86711 Personal history of pulmonary embolism: Secondary | ICD-10-CM

## 2018-03-25 ENCOUNTER — Encounter (INDEPENDENT_AMBULATORY_CARE_PROVIDER_SITE_OTHER): Payer: Medicare Other

## 2018-03-25 ENCOUNTER — Encounter (INDEPENDENT_AMBULATORY_CARE_PROVIDER_SITE_OTHER): Payer: Self-pay | Admitting: Vascular Surgery

## 2018-03-25 ENCOUNTER — Ambulatory Visit (INDEPENDENT_AMBULATORY_CARE_PROVIDER_SITE_OTHER): Payer: Medicare Other | Admitting: Vascular Surgery

## 2018-03-25 VITALS — BP 162/76 | HR 71 | Resp 16 | Ht 66.0 in | Wt 121.6 lb

## 2018-03-25 DIAGNOSIS — I2782 Chronic pulmonary embolism: Secondary | ICD-10-CM | POA: Diagnosis not present

## 2018-03-25 DIAGNOSIS — I82501 Chronic embolism and thrombosis of unspecified deep veins of right lower extremity: Secondary | ICD-10-CM | POA: Diagnosis not present

## 2018-03-25 DIAGNOSIS — I2699 Other pulmonary embolism without acute cor pulmonale: Secondary | ICD-10-CM | POA: Insufficient documentation

## 2018-03-25 NOTE — Progress Notes (Signed)
Subjective:    Patient ID: Christine Vaughan, female    DOB: [birth year], 85 y.o.   MRN: [id number] Chief Complaint  Patient presents with  . New Patient (Initial Visit)   Presents as a new patient referred by the [facility] ED for a known history of pulmonary embolism first diagnosed in December 2019.  The patient endorses a history of experiencing trauma to her right leg on Thanksgiving.  The patient notes hitting her right leg very hard on a table.  The patient was diagnosed with an acute pulmonary embolism however due to her history of frequent falls and uneven gait anticoagulation was thought to be contraindicated.  The patient was offered an IVC filter at that time however she refused.  The patient was treated at [facility] for this.  The patient was seen at the [facility] ED on 03/12/18 for "chest pain".  She does have a past medical history of lung disease.  A CTA of the chest at that time was notable for no CT evidence of acute pulmonary artery embolus. Small chronically occluded segmental branch in the right middle lobe.  The patient underwent a bilateral lower extremity venous duplex and was found to have no evidence of deep vein thrombosis in the left lower extremity.  The right lower extremity was notable for a chronic deep vein thrombosis involving the right femoral vein, right popliteal vein, right posterior tibial vein and right peroneal vein.  The patient denies any lower extremity pain or edema.  The patient is not on any type of anticoagulation.  The patient does have a pulmonologist who follows her pulmonary disease.  Patient denies any fever, nausea vomiting.  Review of Systems  Constitutional: Negative.   HENT: Negative.   Eyes: Negative.   Respiratory:       PE  Cardiovascular:       DVT  Gastrointestinal: Negative.   Endocrine: Negative.   Genitourinary: Negative.   Musculoskeletal: Negative.   Skin: Negative.   Allergic/Immunologic: Negative.   Neurological: Negative.    Hematological: Negative.   Psychiatric/Behavioral: Negative.       Objective:   Physical Exam Vitals signs reviewed.  Constitutional:      Appearance: Normal appearance.  HENT:     Head: Normocephalic and atraumatic.     Right Ear: External ear normal.     Left Ear: External ear normal.     Nose: Nose normal.     Mouth/Throat:     Mouth: Mucous membranes are moist.     Pharynx: Oropharynx is clear.  Eyes:     Extraocular Movements: Extraocular movements intact.     Conjunctiva/sclera: Conjunctivae normal.     Pupils: Pupils are equal, round, and reactive to light.  Neck:     Musculoskeletal: Normal range of motion.  Cardiovascular:     Rate and Rhythm: Normal rate and regular rhythm.     Pulses: Normal pulses.     Heart sounds: Normal heart sounds.  Pulmonary:     Effort: Pulmonary effort is normal.     Breath sounds: Normal breath sounds.  Abdominal:     General: Abdomen is flat.     Palpations: Abdomen is soft.  Musculoskeletal: Normal range of motion.        General: No swelling.  Skin:    General: Skin is warm and dry.  Neurological:     General: No focal deficit present.     Mental Status: She is alert and oriented to person, place, and  time. Mental status is at baseline.  Psychiatric:        Mood and Affect: Mood normal.        Behavior: Behavior normal.        Thought Content: Thought content normal.        Judgment: Judgment normal.    BP (!) 162/76 (BP Location: Left Arm, Patient Position: Sitting)   Pulse 71   Resp 16   Ht 5\' 6"  (1.676 m)   Wt 121 lb 9.6 oz (55.2 kg)   BMI 19.63 kg/m   Past Medical History:  Diagnosis Date  . Anxiety   . Breast cancer, right ([place]) 2001   Right Lumpectomy, chemo + rad tx's.   . Chronic kidney disease   . Hereditary and idiopathic peripheral neuropathy 12/27/2015  . Hypertension   . MAI (mycobacterium avium-intracellulare) ([place])   . Thyroid disease    Social History   Socioeconomic History  . Marital  status: Married    Spouse name: Not on file  . Number of children: 4  . Years of education: Not on file  . Highest education level: Not on file  Occupational History  . Occupation: Retired  Scientific laboratory technician  . Financial resource strain: Not on file  . Food insecurity:    Worry: Not on file    Inability: Not on file  . Transportation needs:    Medical: Not on file    Non-medical: Not on file  Tobacco Use  . Smoking status: Never Smoker  . Smokeless tobacco: Never Used  Substance and Sexual Activity  . Alcohol use: No  . Drug use: No  . Sexual activity: Not Currently  Lifestyle  . Physical activity:    Days per week: Not on file    Minutes per session: Not on file  . Stress: Not on file  Relationships  . Social connections:    Talks on phone: Not on file    Gets together: Not on file    Attends religious service: Not on file    Active member of club or organization: Not on file    Attends meetings of clubs or organizations: Not on file    Relationship status: Not on file  . Intimate partner violence:    Fear of current or ex partner: Not on file    Emotionally abused: Not on file    Physically abused: Not on file    Forced sexual activity: Not on file  Other Topics Concern  . Not on file  Social History Narrative   Lives at home w/ her husband   Right-hand   Caffeine: none   Past Surgical History:  Procedure Laterality Date  . BACK SURGERY    . BREAST BIOPSY Left    surgical bx pt dose not remember  . BREAST EXCISIONAL BIOPSY Right 2001   + chem rad and armidex  . BREAST LUMPECTOMY    . BREAST SURGERY    . CHOLECYSTECTOMY    . ESOPHAGOGASTRODUODENOSCOPY (EGD) WITH PROPOFOL N/A 09/20/2015   Procedure: ESOPHAGOGASTRODUODENOSCOPY (EGD) WITH PROPOFOL;  Surgeon: Lollie Sails, MD;  Location: [facility] ENDOSCOPY;  Service: Endoscopy;  Laterality: N/A;   Family History  Problem Relation Age of Onset  . Depression Mother   . Brain cancer Mother    Allergies  Allergen  Reactions  . Ciprofloxacin Other (See Comments)    aggravates her neuropathy  . Erythromycin Other (See Comments)    Gi distress  . Metronidazole     Other  reaction(s): Abdominal Pain  . Sulfur Other (See Comments)    Doesn't remember  . Tizanidine Other (See Comments)    GI upset  . Penicillins Rash    Has patient had a PCN reaction causing immediate rash, facial/tongue/throat swelling, SOB or lightheadedness with hypotension: No Has patient had a PCN reaction causing severe rash involving mucus membranes or skin necrosis: No Has patient had a PCN reaction that required hospitalization No Has patient had a PCN reaction occurring within the last 10 years: no If all of the above answers are "NO", then may proceed with Cephalosporin use.  . Sulfamethoxazole Rash    all over body      Assessment & Plan:  Presents as a new patient referred by the [facility] ED for a known history of pulmonary embolism first diagnosed in December 2019.  The patient endorses a history of experiencing trauma to her right leg on Thanksgiving.  The patient notes hitting her right leg very hard on a table.  The patient was diagnosed with an acute pulmonary embolism however due to her history of frequent falls and uneven gait anticoagulation was thought to be contraindicated.  The patient was offered an IVC filter at that time however she refused.  The patient was treated at [facility] for this.  The patient was seen at the [facility] ED on 03/12/18 for "chest pain".  She does have a past medical history of lung disease.  A CTA of the chest at that time was notable for no CT evidence of acute pulmonary artery embolus. Small chronically occluded segmental branch in the right middle lobe.  The patient underwent a bilateral lower extremity venous duplex and was found to have no evidence of deep vein thrombosis in the left lower extremity.  The right lower extremity was notable for a chronic deep vein thrombosis involving the  right femoral vein, right popliteal vein, right posterior tibial vein and right peroneal vein.  The patient denies any lower extremity pain or edema.  The patient is not on any type of anticoagulation.  The patient does have a pulmonologist who follows her pulmonary disease.  Patient denies any fever, nausea vomiting.  1. Chronic deep vein thrombosis (DVT) of right lower extremity, unspecified vein (HCC) - New The patient was unaware that she ever had a DVT to the lower extremity. At this time, the patient denies any lower extremity pain or edema. Physical exam of the lower extremities is unremarkable. Due to the chronicity of the patient's right lower extremity DVT anti-coagulation is not indicated.  2. Other chronic pulmonary embolism without acute cor pulmonale (HCC) - New Most recent CTA of the chest was notable for resolution of most of the patient's previously diagnosed PE. The remaining PE is now chronic. Due to the chronicity of the patient's DVT and PE and IVC filter is not indicated at this time Even though an IVC filter is not indicated the patient did make it clear she was not interested in pursuing this anyway. The patient was encouraged to follow-up with her pulmonologist to continue care for her pulmonary issues.  It is to follow-up PRN.  Current Outpatient Medications on File Prior to Visit  Medication Sig Dispense Refill  . acetaminophen (TYLENOL) 500 MG tablet Take 250 mg by mouth daily as needed.     [name] albuterol (PROVENTIL) (5 MG/ML) 0.5% nebulizer solution Take 2.5 mg by nebulization every 6 (six) hours as needed for wheezing or shortness of breath.    [name] azelastine (  ASTELIN) 0.1 % nasal spray Place into the nose 2 (two) times daily. Use in each nostril as directed    . dicyclomine (BENTYL) 20 MG tablet Take 20 mg by mouth every 6 (six) hours.    [name] esomeprazole (NEXIUM) 40 MG capsule TAKE 1 CAPSULE BY MOUTH TWICE DAILY. TAKE 30 MINUTES BEFORE MEALS.    [name] gabapentin  (NEURONTIN) 100 MG capsule Take 2 capsules (200 mg total) by mouth at bedtime. 60 capsule 6  . ipratropium (ATROVENT) 0.06 % nasal spray Place 2 sprays into the nose 2 (two) times daily as needed.    [name] levothyroxine (SYNTHROID, LEVOTHROID) 50 MCG tablet Take 50 mcg by mouth daily before breakfast.    . metoprolol succinate (TOPROL-XL) 50 MG 24 hr tablet Take 1 tablet by mouth daily.    . sertraline (ZOLOFT) 25 MG tablet Take 1 tablet (25 mg total) by mouth daily. 30 tablet 1  . tobramycin, PF, (TOBI) 300 MG/5ML nebulizer solution Take 300 mg by nebulization 2 (two) times daily.    . traMADol (ULTRAM) 50 MG tablet Take 1 tablet (50 mg total) by mouth every 6 (six) hours as needed. 12 tablet 0   Current Facility-Administered Medications on File Prior to Visit  Medication Dose Route Frequency Provider Last Rate Last Dose  . ciprofloxacin (CIPRO) tablet 500 mg  500 mg Oral Once Stoioff, Scott C, MD      . lidocaine (XYLOCAINE) 2 % jelly 1 application  1 application Urethral Once Stoioff, Ronda Fairly, MD       There are no Patient Instructions on file for this visit. No follow-ups on file.  Erick Oxendine A Essynce Munsch, PA-C

## 2018-04-13 ENCOUNTER — Ambulatory Visit: Payer: Medicare Other | Admitting: Psychiatry

## 2018-04-20 ENCOUNTER — Ambulatory Visit: Payer: Medicare Other | Admitting: Psychiatry

## 2018-04-27 ENCOUNTER — Other Ambulatory Visit: Payer: Self-pay

## 2018-04-27 ENCOUNTER — Encounter: Payer: Self-pay | Admitting: Psychiatry

## 2018-04-27 ENCOUNTER — Ambulatory Visit (INDEPENDENT_AMBULATORY_CARE_PROVIDER_SITE_OTHER): Payer: Medicare Other | Admitting: Psychiatry

## 2018-04-27 VITALS — BP 153/80 | HR 69

## 2018-04-27 DIAGNOSIS — F411 Generalized anxiety disorder: Secondary | ICD-10-CM

## 2018-04-27 DIAGNOSIS — F331 Major depressive disorder, recurrent, moderate: Secondary | ICD-10-CM

## 2018-04-27 MED ORDER — SERTRALINE HCL 25 MG PO TABS: 25.0000 mg | ORAL_TABLET | Freq: Every day | ORAL | 1 refills | Status: DC

## 2018-04-27 MED ORDER — GABAPENTIN 300 MG PO CAPS: 300.0000 mg | ORAL_CAPSULE | Freq: Every day | ORAL | 1 refills | Status: DC

## 2018-04-27 NOTE — Progress Notes (Signed)
Psychiatric MD Follow up Note   Patient Identification: Christine Vaughan MRN:  [id number] Date of Evaluation:  04/27/2018 Referral Source: PCP Chief Complaint:   Chief Complaint    Follow-up     Visit Diagnosis:    ICD-10-CM   1. GAD (generalized anxiety disorder) F41.1   2. MDD (major depressive disorder), recurrent episode, moderate (HCC) F33.1     History of Present Illness:    Patient is a 85 year old married female who presented for follow-up.  She was sitting in the wheelchair and was brought by her granddaughter.  She was last seen in December.  She remained focused on her medical problems.  She was recently diagnosed with clot and has been taking several medications.  She remained focused on her Ativan and reported that she wants a new prescription as she is unable to sleep.  Her granddaughter reported that she is being managed by her daughters as well as her granddaughter and she has difficulty walking and she spends most of the time in the wheelchair.  Patient also reported that she feels dizzy during the daytime.  Patient stated that she feels depressed and anxious and her husband is also sick at home.  She was constantly asking about the Ativan as she reported that she cannot sleep without the medication.  We has discussed at length in the past about discontinuing her Ativan and she is not getting any prescriptions.  She reported that she is currently taking Neurontin 200 mg at bedtime.  She appeared older and somewhat disheveled during the interview.  No suicidal or homicidal ideations at this time noted.     Family has been supportive and taking care of the patient at this time.    Associated Signs/Symptoms: Depression Symptoms:  depressed mood, fatigue, anxiety, (Hypo) Manic Symptoms:  none Anxiety Symptoms:  Excessive Worry, Psychotic Symptoms:  none PTSD Symptoms: Negative NA  Past Psychiatric History:   Patient reported that she has tried some medications in  the past but is unable to recall the names. She does not have any history of admission to a psychiatric hospital.  Previous Psychotropic Medications:  Unable to remember the names.  Substance Abuse History in the last 12 months:  No.  Consequences of Substance Abuse: Negative NA  Past Medical History:  Past Medical History:  Diagnosis Date  . Anxiety   . Breast cancer, right ([place]) 2001   Right Lumpectomy, chemo + rad tx's.   . Chronic kidney disease   . Hereditary and idiopathic peripheral neuropathy 12/27/2015  . Hypertension   . MAI (mycobacterium avium-intracellulare) ([place])   . Thyroid disease     Past Surgical History:  Procedure Laterality Date  . BACK SURGERY    . BREAST BIOPSY Left    surgical bx pt dose not remember  . BREAST EXCISIONAL BIOPSY Right 2001   + chem rad and armidex  . BREAST LUMPECTOMY    . BREAST SURGERY    . CHOLECYSTECTOMY    . ESOPHAGOGASTRODUODENOSCOPY (EGD) WITH PROPOFOL N/A 09/20/2015   Procedure: ESOPHAGOGASTRODUODENOSCOPY (EGD) WITH PROPOFOL;  Surgeon: Lollie Sails, MD;  Location: [facility] ENDOSCOPY;  Service: Endoscopy;  Laterality: N/A;    Family Psychiatric History: Depression in her mother. Family History:  Family History  Problem Relation Age of Onset  . Depression Mother   . Brain cancer Mother     Social History:   Social History   Socioeconomic History  . Marital status: Married    Spouse name: Not on file  .  Number of children: 4  . Years of education: Not on file  . Highest education level: Not on file  Occupational History  . Occupation: Retired  Scientific laboratory technician  . Financial resource strain: Not on file  . Food insecurity:    Worry: Not on file    Inability: Not on file  . Transportation needs:    Medical: Not on file    Non-medical: Not on file  Tobacco Use  . Smoking status: Never Smoker  . Smokeless tobacco: Never Used  Substance and Sexual Activity  . Alcohol use: No  . Drug use: No  . Sexual activity:  Not Currently  Lifestyle  . Physical activity:    Days per week: Not on file    Minutes per session: Not on file  . Stress: Not on file  Relationships  . Social connections:    Talks on phone: Not on file    Gets together: Not on file    Attends religious service: Not on file    Active member of club or organization: Not on file    Attends meetings of clubs or organizations: Not on file    Relationship status: Not on file  Other Topics Concern  . Not on file  Social History Narrative   Lives at home w/ her husband   Right-hand   Caffeine: none    Additional Social History: She lives with her husband. She has 4 daughters and they are very supportive.  Allergies:   Allergies  Allergen Reactions  . Ciprofloxacin Other (See Comments)    aggravates her neuropathy  . Erythromycin Other (See Comments)    Gi distress  . Metronidazole     Other reaction(s): Abdominal Pain  . Sulfur Other (See Comments)    Doesn't remember  . Tizanidine Other (See Comments)    GI upset  . Penicillins Rash    Has patient had a PCN reaction causing immediate rash, facial/tongue/throat swelling, SOB or lightheadedness with hypotension: No Has patient had a PCN reaction causing severe rash involving mucus membranes or skin necrosis: No Has patient had a PCN reaction that required hospitalization No Has patient had a PCN reaction occurring within the last 10 years: no If all of the above answers are "NO", then may proceed with Cephalosporin use.  . Sulfamethoxazole Rash    all over body    Metabolic Disorder Labs: No results found for: HGBA1C, MPG No results found for: PROLACTIN No results found for: CHOL, TRIG, HDL, CHOLHDL, VLDL, LDLCALC   Current Medications: Current Outpatient Medications  Medication Sig Dispense Refill  . acetaminophen (TYLENOL) 500 MG tablet Take 250 mg by mouth daily as needed.     [name] albuterol (PROVENTIL) (5 MG/ML) 0.5% nebulizer solution Take 2.5 mg by nebulization  every 6 (six) hours as needed for wheezing or shortness of breath.    [name] azelastine (ASTELIN) 0.1 % nasal spray Place into the nose 2 (two) times daily. Use in each nostril as directed    . dicyclomine (BENTYL) 20 MG tablet Take 20 mg by mouth every 6 (six) hours.    [name] esomeprazole (NEXIUM) 40 MG capsule TAKE 1 CAPSULE BY MOUTH TWICE DAILY. TAKE 30 MINUTES BEFORE MEALS.    [name] gabapentin (NEURONTIN) 100 MG capsule Take 2 capsules (200 mg total) by mouth at bedtime. 60 capsule 6  . gabapentin (NEURONTIN) 300 MG capsule     . ipratropium (ATROVENT) 0.06 % nasal spray Place 2 sprays into the nose 2 (two) times  daily as needed.    [name] levothyroxine (SYNTHROID, LEVOTHROID) 50 MCG tablet Take 50 mcg by mouth daily before breakfast.    . metoprolol succinate (TOPROL-XL) 50 MG 24 hr tablet Take 1 tablet by mouth daily.    . sertraline (ZOLOFT) 25 MG tablet Take 1 tablet (25 mg total) by mouth daily. 30 tablet 1  . tobramycin, PF, (TOBI) 300 MG/5ML nebulizer solution Take 300 mg by nebulization 2 (two) times daily.    . traMADol (ULTRAM) 50 MG tablet Take 1 tablet (50 mg total) by mouth every 6 (six) hours as needed. 12 tablet 0   Current Facility-Administered Medications  Medication Dose Route Frequency Provider Last Rate Last Dose  . ciprofloxacin (CIPRO) tablet 500 mg  500 mg Oral Once Stoioff, Scott C, MD      . lidocaine (XYLOCAINE) 2 % jelly 1 application  1 application Urethral Once Stoioff, Ronda Fairly, MD        Neurologic: Headache: No Seizure: No Paresthesias:No  Musculoskeletal: Strength & Muscle Tone: decreased Gait & Station: normal Patient leans: N/A  Psychiatric Specialty Exam: Review of Systems  Constitutional: Positive for malaise/fatigue and weight loss.  Gastrointestinal: Positive for nausea.  Musculoskeletal: Positive for myalgias.  Neurological: Positive for weakness.  Psychiatric/Behavioral: The patient is nervous/anxious.     Blood pressure (!) 153/80, pulse 69, SpO2 96  %.There is no height or weight on file to calculate BMI.  General Appearance: Casual  Eye Contact:  Fair  Speech:  Slow  Volume:  Normal  Mood:  Anxious  Affect:  Congruent  Thought Process:  Coherent and Goal Directed  Orientation:  Full (Time, Place, and Person)  Thought Content:  WDL  Suicidal Thoughts:  No  Homicidal Thoughts:  No  Memory:  Immediate;   Fair Recent;   Fair  Judgement:  Fair  Insight:  Fair  Psychomotor Activity:  Normal  Concentration:  Concentration: Fair and Attention Span: Fair  Recall:  [employer] of Knowledge:Fair  Language: Fair  Akathisia:  No  Handed:  Right  AIMS (if indicated):    Assets:  Communication Skills Desire for Improvement Physical Health Social Support  ADL's:  Intact  Cognition: WNL  Sleep:      Treatment Plan Summary: Medication management   Discussed with patient what the medications at length. I will adjust her medications as follows  No prescriptopn of ativan given to pts.   Gabapentin 300 mg at bedtime.   Zoloft 25 mg p.o. daily for her anxiety and she agreed with the plan.  She will come back for follow-up appointment in 1 months or earlier depending on her symptoms.    More than 50% of the time spent in psychoeducation, counseling and coordination of care.    This note was generated in part or whole with voice recognition software. Voice regonition is usually quite accurate but there are transcription errors that can and very often do occur. I apologize for any typographical errors that were not detected and corrected.    Rainey Pines, MD 3/2/20202:14 PM

## 2018-07-22 ENCOUNTER — Other Ambulatory Visit: Payer: Self-pay | Admitting: Psychiatry

## 2018-07-28 ENCOUNTER — Encounter: Payer: Self-pay | Admitting: Podiatry

## 2018-07-28 ENCOUNTER — Other Ambulatory Visit: Payer: Self-pay

## 2018-07-28 ENCOUNTER — Ambulatory Visit: Payer: Medicare Other | Admitting: Podiatry

## 2018-07-28 VITALS — BP 123/76 | HR 56 | Temp 98.1°F

## 2018-07-28 DIAGNOSIS — L603 Nail dystrophy: Secondary | ICD-10-CM

## 2018-07-28 DIAGNOSIS — B351 Tinea unguium: Secondary | ICD-10-CM

## 2018-07-28 DIAGNOSIS — M79676 Pain in unspecified toe(s): Secondary | ICD-10-CM | POA: Diagnosis not present

## 2018-07-28 MED ORDER — GENTAMICIN SULFATE 0.1 % EX CREA: 1.0000 "application " | TOPICAL_CREAM | Freq: Two times a day (BID) | CUTANEOUS | 1 refills | Status: DC

## 2018-07-31 NOTE — Progress Notes (Signed)
   Subjective: 85 year old female presenting today as a new patient with a chief complaint of a painful, thickened left great toenail that has been symptomatic for the past several months. She states the nail has grown into the skin and is lifting. She denies modifying factors and has not done anything for treatment. She is also complaining of elongated, thickened nails that cause pain while ambulating in shoes. She is unable to trim her own nails. Patient is here for further evaluation and treatment.   Past Medical History:  Diagnosis Date  . Anxiety   . Breast cancer, right ([place]) 2001   Right Lumpectomy, chemo + rad tx's.   . Chronic kidney disease   . Hereditary and idiopathic peripheral neuropathy 12/27/2015  . Hypertension   . MAI (mycobacterium avium-intracellulare) ([place])   . Thyroid disease     Objective:  General: Well developed, nourished, in no acute distress, alert and oriented x3   Dermatology: Hyperkeratotic, discolored, thickened, onychodystrophy of the left great toenail. Nails are tender, long, thickened and dystrophic with subungual debris, consistent with onychomycosis, 1-5 bilateral. Skin is warm, dry and supple bilateral lower extremities. Negative for open lesions or macerations.  Vascular: Dorsalis Pedis artery and Posterior Tibial artery pedal pulses palpable. No lower extremity edema noted.   Neruologic: Grossly intact via light touch bilateral.  Musculoskeletal: Muscular strength within normal limits in all groups bilateral. Normal range of motion noted to all pedal and ankle joints.   Assessment:  #1 Dystrophic nail of the left hallux #2 Onychodystrophic nails 1-5 bilateral with hyperkeratosis of nails.  #3 Onychomycosis of nail due to dermatophyte bilateral   Plan of Care:  1. Patient evaluated.  2. Discussed treatment alternatives and plan of care. Explained nail avulsion procedure and post procedure course to patient. 3. Patient opted for total  temporary nail avulsion of the left great toe.  4. Prior to procedure, local anesthesia infiltration utilized using 3 ml of a 50:50 mixture of 2% plain lidocaine and 0.5% plain marcaine in a normal hallux block fashion and a betadine prep performed.  5. Light dressing applied. 6. Mechanical debridement of nails 1-5 bilaterally performed using a nail nipper. Filed with dremel without incident.  7. Prescription for Gentamicin cream provided to patient to use daily with a bandage.  8. Return to clinic in 2 weeks.   Edrick Kins, DPM [facility]  Dr. Edrick Kins, [address]                                        [place], [place] [address]                Office [phone]  Fax [phone]

## 2018-08-18 ENCOUNTER — Ambulatory Visit: Payer: Medicare Other | Admitting: Podiatry

## 2018-08-18 ENCOUNTER — Other Ambulatory Visit: Payer: Self-pay

## 2018-08-18 VITALS — Temp 98.1°F

## 2018-08-18 DIAGNOSIS — B351 Tinea unguium: Secondary | ICD-10-CM

## 2018-08-18 DIAGNOSIS — M79676 Pain in unspecified toe(s): Secondary | ICD-10-CM | POA: Diagnosis not present

## 2018-08-18 DIAGNOSIS — L603 Nail dystrophy: Secondary | ICD-10-CM | POA: Diagnosis not present

## 2018-08-20 NOTE — Progress Notes (Signed)
   Subjective: Patient presents today status post total temporary nail avulsion procedure of the left hallux that was done on 07/28/2018. Patient states that the toe and nail fold is feeling much better. She has been applying Gentamicin as directed. She denies any new complaints. Patient is here for further evaluation and treatment.   Past Medical History:  Diagnosis Date  . Anxiety   . Breast cancer, right ([place]) 2001   Right Lumpectomy, chemo + rad tx's.   . Chronic kidney disease   . Hereditary and idiopathic peripheral neuropathy 12/27/2015  . Hypertension   . MAI (mycobacterium avium-intracellulare) ([place])   . Thyroid disease     Objective: Skin is warm, dry and supple. Nail bed and respective nail fold appears to be healing appropriately. Open wound to the associated nail fold with a granular wound base and moderate amount of fibrotic tissue. Minimal drainage noted.   Assessment: #1 postop temporary total nail avulsion left hallux  #2 open wound periungual nail fold and nail bed of respective digit.   Plan of care: #1 patient was evaluated  #2 debridement of open wound was performed to the periungual border and nail fold of the respective toe using a currette. Antibiotic ointment and Band-Aid was applied. #3 patient is to return to clinic on a PRN  basis.   Edrick Kins, DPM [facility]  Dr. Edrick Kins, [place]                                        [place], [place] [address]                Office [phone]  Fax [phone]

## 2018-08-25 ENCOUNTER — Other Ambulatory Visit: Payer: Self-pay | Admitting: Psychiatry

## 2018-08-31 ENCOUNTER — Ambulatory Visit (INDEPENDENT_AMBULATORY_CARE_PROVIDER_SITE_OTHER): Payer: Medicare Other | Admitting: Psychiatry

## 2018-08-31 ENCOUNTER — Encounter: Payer: Self-pay | Admitting: Psychiatry

## 2018-08-31 ENCOUNTER — Other Ambulatory Visit: Payer: Self-pay

## 2018-08-31 DIAGNOSIS — F331 Major depressive disorder, recurrent, moderate: Secondary | ICD-10-CM

## 2018-08-31 DIAGNOSIS — F411 Generalized anxiety disorder: Secondary | ICD-10-CM | POA: Diagnosis not present

## 2018-08-31 MED ORDER — SERTRALINE HCL 25 MG PO TABS: 25.0000 mg | ORAL_TABLET | Freq: Every day | ORAL | 3 refills | Status: DC

## 2018-08-31 MED ORDER — GABAPENTIN 100 MG PO CAPS: 200.0000 mg | ORAL_CAPSULE | Freq: Every day | ORAL | 6 refills | Status: DC

## 2018-08-31 NOTE — Progress Notes (Signed)
Patient ID: Christine Vaughan, female   DOB: April 30, 1931, 85 y.o.   MRN: [id number]    Patient is 85 year old female with history of depression anxiety who was followed up for her medications. She continues to focus on prescription of Xanax and reported that she has been anxious and apprehensive as she has been staying at home most of the time. She reported that her husband is also sick and the family members are coming to help with his bandage. She reported that one of the family is also staying at home at night. She reported that she has poor sleep and she has been taking a quarter pill of the Tylenol to help her sleep at night. She currently denied having any suicidal or homicidal ideations or plans. She has been taking gabapentin to help her sleep. She remains compliant with her medications. She is not getting any benzodiazepines prescriptions at this time.  Plan Continue medications as prescribed No benzodiazepines are prescribed to this patient.  Follow up in two months earlier depending on her symptoms.  I discussed the assessment and treatment plan with the patient. The patient was provided an opportunity to ask questions and all were answered. The patient agreed with the plan and demonstrated an understanding of the instructions.   The patient was advised to call back or seek an in-person evaluation if the symptoms worsen or if the condition fails to improve as anticipated.   I provided 15 minutes of non-face-to-face time during this encounter.

## 2018-10-06 ENCOUNTER — Ambulatory Visit: Payer: Medicare Other | Admitting: Urology

## 2018-10-26 ENCOUNTER — Other Ambulatory Visit: Payer: Self-pay | Admitting: Certified Nurse Midwife

## 2018-10-26 DIAGNOSIS — N6313 Unspecified lump in the right breast, lower outer quadrant: Secondary | ICD-10-CM

## 2018-10-29 ENCOUNTER — Other Ambulatory Visit: Payer: Self-pay | Admitting: Certified Nurse Midwife

## 2018-10-29 ENCOUNTER — Ambulatory Visit
Admission: RE | Admit: 2018-10-29 | Discharge: 2018-10-29 | Disposition: A | Discharge status: Home / Self Care | Payer: Medicare Other | Source: Ambulatory Visit | Attending: Certified Nurse Midwife | Admitting: Certified Nurse Midwife

## 2018-10-29 DIAGNOSIS — N6313 Unspecified lump in the right breast, lower outer quadrant: Secondary | ICD-10-CM

## 2018-11-09 ENCOUNTER — Other Ambulatory Visit: Payer: Self-pay

## 2018-11-09 ENCOUNTER — Encounter: Payer: Self-pay | Admitting: Psychiatry

## 2018-11-09 ENCOUNTER — Ambulatory Visit (INDEPENDENT_AMBULATORY_CARE_PROVIDER_SITE_OTHER): Payer: Medicare Other | Admitting: Psychiatry

## 2018-11-09 DIAGNOSIS — F331 Major depressive disorder, recurrent, moderate: Secondary | ICD-10-CM | POA: Diagnosis not present

## 2018-11-09 DIAGNOSIS — F411 Generalized anxiety disorder: Secondary | ICD-10-CM

## 2018-11-09 MED ORDER — GABAPENTIN 100 MG PO CAPS: ORAL_CAPSULE | ORAL | 3 refills | Status: DC

## 2018-11-09 MED ORDER — SERTRALINE HCL 25 MG PO TABS: 25.0000 mg | ORAL_TABLET | Freq: Every day | ORAL | 3 refills | Status: AC

## 2018-11-09 NOTE — Progress Notes (Signed)
Patient ID: Christine Vaughan, female   DOB: [birth year], 85 y.o.   MRN: [id number]  Patient is 85 year old female with history of anxiety and depression who was followed up for medication management. She reported that she has been compliant with her medications and has been taking oxygen as she has breathing difficulties related to her lung conditions which were diagnosed almost 4 1/2 years ago. She reported that she has been having difficulty breathing but her pulmonologist is monitoring her closely. Patient reported that she is doing well on sertraline at this time and she thinks that the current combinations of medication is helping her. She takes Neurontin to help her sleep at night. She also takes quarter of Tylenol to help her sleep occasionally if she will remember. She reported that she has hidden three lorazepam pills but she thinks that she will not be able to tolerate them anymore as her"head is bubbly now"and she is becoming weaker. She was very appreciative that her medications are now stable and she does not want to change her medications. She currently denied having any suicidal ideations or plans. She was anxious about my departure from this practice . Patient denied having any adverse effects from her medications and remains compliant.  Plan I will Continue her medications. Follow-up in 2 to 3 months depending on her symptoms.  I connected with patient via telemedicine application and verified that I am speaking with the correct person using two identifiers.  I discussed the limitations of evaluation and management by telemedicine and the availability of in person appointments. The patient expressed understanding and agreed to proceed.   I discussed the assessment and treatment plan with the patient. The patient was provided an opportunity to ask questions and all were answered. The patient agreed with the plan and demonstrated an understanding of the instructions.   The patient was  advised to call back or seek an in-person evaluation if the symptoms worsen or if the condition fails to improve as anticipated.   I provided 15 minutes of non-face-to-face time during this encounter.

## 2018-11-17 ENCOUNTER — Telehealth: Payer: Self-pay | Admitting: Physician Assistant

## 2018-11-17 ENCOUNTER — Ambulatory Visit (INDEPENDENT_AMBULATORY_CARE_PROVIDER_SITE_OTHER): Payer: Medicare Other | Admitting: Physician Assistant

## 2018-11-17 ENCOUNTER — Encounter: Payer: Self-pay | Admitting: Physician Assistant

## 2018-11-17 ENCOUNTER — Other Ambulatory Visit: Payer: Self-pay

## 2018-11-17 VITALS — BP 175/91 | HR 56 | Ht 66.0 in

## 2018-11-17 DIAGNOSIS — R300 Dysuria: Secondary | ICD-10-CM | POA: Diagnosis not present

## 2018-11-17 DIAGNOSIS — N952 Postmenopausal atrophic vaginitis: Secondary | ICD-10-CM | POA: Diagnosis not present

## 2018-11-17 DIAGNOSIS — R39198 Other difficulties with micturition: Secondary | ICD-10-CM

## 2018-11-17 DIAGNOSIS — Z87448 Personal history of other diseases of urinary system: Secondary | ICD-10-CM

## 2018-11-17 LAB — URINALYSIS, COMPLETE
Bilirubin, UA: NEGATIVE
Ketones, UA: NEGATIVE
Leukocytes,UA: NEGATIVE
Nitrite, UA: POSITIVE — AB
Protein,UA: NEGATIVE
RBC, UA: NEGATIVE
Specific Gravity, UA: 1.01 (ref 1.005–1.030)
Urobilinogen, Ur: 0.2 mg/dL (ref 0.2–1.0)
pH, UA: 5.5 (ref 5.0–7.5)

## 2018-11-17 LAB — MICROSCOPIC EXAMINATION
Bacteria, UA: NONE SEEN
RBC, Urine: NONE SEEN /hpf (ref 0–2)

## 2018-11-17 MED ORDER — TAMSULOSIN HCL 0.4 MG PO CAPS: 0.4000 mg | ORAL_CAPSULE | Freq: Every day | ORAL | 0 refills | Status: DC

## 2018-11-17 NOTE — Patient Instructions (Signed)
To treat the burning in your vagina, continue to use the topical steroid cream you were prescribed by your gynecologist last month.  Also, add an over-the-counter vaginal moisturizer that you can find at the pharmacy. If you continue to have burning with this, you may switch to A&D ointment to the area that is burning.

## 2018-11-17 NOTE — Progress Notes (Signed)
11/17/2018 11:46 AM   Christine Vaughan Apr 06, 1931 [id number]  CC: Dysuria, difficulty urinating  HPI: Christine Vaughan is a 85 y.o. female who presents today for evaluation of possible UTI. She is an established [facility] patient who last saw Zara Council on 09/30/2017 for follow-up of hematuria, dysuria, and vaginal atrophy.  She reports a months-long history of difficulty urinating, vulvar burning, and pumps to her inguinal creases and labia majora. She denies gross hematuria, fevers, and chills. She has taken Azo at home with no symptom palliation.  She reports having been seen by GYN within the past month for the vaginal burning and prescribed clobetasol cream. She states this has not helped her symptoms.  She has a personal history of breast cancer.  She has mild daytime urinary leakage, no enuresis.  In-office UA today positive for orange color and nitrites; urine microscopy pan-negative. PVR 148mL. She has a history of elevated PVR.  PMH: Past Medical History:  Diagnosis Date  . Anxiety   . Breast cancer, right ([place]) 2001   Right Lumpectomy, chemo + rad tx's.   . Chronic kidney disease   . Hereditary and idiopathic peripheral neuropathy 12/27/2015  . Hypertension   . MAI (mycobacterium avium-intracellulare) ([place])   . Thyroid disease     Surgical History: Past Surgical History:  Procedure Laterality Date  . BACK SURGERY    . BREAST BIOPSY Left    surgical bx pt dose not remember  . BREAST EXCISIONAL BIOPSY Right 2001   + chem rad and armidex  . BREAST LUMPECTOMY  2001  . BREAST SURGERY    . CHOLECYSTECTOMY    . ESOPHAGOGASTRODUODENOSCOPY (EGD) WITH PROPOFOL N/A 09/20/2015   Procedure: ESOPHAGOGASTRODUODENOSCOPY (EGD) WITH PROPOFOL;  Surgeon: Lollie Sails, MD;  Location[facility] ENDOSCOPY;  Service: Endoscopy;  Laterality: N/A;    Home Medications:  Allergies as of 11/17/2018      Reactions   Ciprofloxacin Other (See Comments)   aggravates her neuropathy   Erythromycin Other (See Comments)   Gi distress   Metronidazole    Other reaction(s): Abdominal Pain   Sulfur Other (See Comments)   Doesn't remember   Tizanidine Other (See Comments)   GI upset   Penicillins Rash   Has patient had a PCN reaction causing immediate rash, facial/tongue/throat swelling, SOB or lightheadedness with hypotension: No Has patient had a PCN reaction causing severe rash involving mucus membranes or skin necrosis: No Has patient had a PCN reaction that required hospitalization No Has patient had a PCN reaction occurring within the last 10 years: no If all of the above answers are "NO", then may proceed with Cephalosporin use.   Sulfamethoxazole Rash   all over body      Medication List       Accurate as of November 17, 2018 11:59 PM. If you have any questions, ask your nurse or doctor.        acetaminophen 500 MG tablet Commonly known as: TYLENOL Take 250 mg by mouth daily as needed.   albuterol (5 MG/ML) 0.5% nebulizer solution Commonly known as: PROVENTIL Take 2.5 mg by nebulization every 6 (six) hours as needed for wheezing or shortness of breath.   azelastine 0.1 % nasal spray Commonly known as: ASTELIN Place into the nose 2 (two) times daily. Use in each nostril as directed   dicyclomine 20 MG tablet Commonly known as: BENTYL Take 20 mg by mouth every 6 (six) hours.   esomeprazole 40 MG capsule Commonly known as:  NEXIUM TAKE 1 CAPSULE BY MOUTH TWICE DAILY. TAKE 30 MINUTES BEFORE MEALS.   gabapentin 100 MG capsule Commonly known as: NEURONTIN Take 2 capsules (200 mg total) by mouth at bedtime.   gabapentin 100 MG capsule Commonly known as: NEURONTIN 1-2 pills at night   gentamicin cream 0.1 % Commonly known as: GARAMYCIN Apply 1 application topically 2 (two) times daily.   ipratropium 0.06 % nasal spray Commonly known as: ATROVENT Place 2 sprays into the nose 2 (two) times daily as needed.   levothyroxine 50 MCG tablet Commonly  known as: SYNTHROID Take 50 mcg by mouth daily before breakfast.   metoprolol succinate 50 MG 24 hr tablet Commonly known as: TOPROL-XL Take 1 tablet by mouth daily.   sertraline 25 MG tablet Commonly known as: ZOLOFT Take 1 tablet (25 mg total) by mouth daily.   tamsulosin 0.4 MG Caps capsule Commonly known as: FLOMAX Take 1 capsule (0.4 mg total) by mouth daily. Started by: Debroah Loop, PA-C   tobramycin (PF) 300 MG/5ML nebulizer solution Commonly known as: TOBI Take 300 mg by nebulization 2 (two) times daily.   traMADol 50 MG tablet Commonly known as: Ultram Take 1 tablet (50 mg total) by mouth every 6 (six) hours as needed.       Allergies:  Allergies  Allergen Reactions  . Ciprofloxacin Other (See Comments)    aggravates her neuropathy  . Erythromycin Other (See Comments)    Gi distress  . Metronidazole     Other reaction(s): Abdominal Pain  . Sulfur Other (See Comments)    Doesn't remember  . Tizanidine Other (See Comments)    GI upset  . Penicillins Rash    Has patient had a PCN reaction causing immediate rash, facial/tongue/throat swelling, SOB or lightheadedness with hypotension: No Has patient had a PCN reaction causing severe rash involving mucus membranes or skin necrosis: No Has patient had a PCN reaction that required hospitalization No Has patient had a PCN reaction occurring within the last 10 years: no If all of the above answers are "NO", then may proceed with Cephalosporin use.  . Sulfamethoxazole Rash    all over body    Family History: Family History  Problem Relation Age of Onset  . Depression Mother   . Brain cancer Mother   . Breast cancer Paternal Aunt     Social History:   reports that she has never smoked. She has never used smokeless tobacco. She reports that she does not drink alcohol or use drugs.  ROS: UROLOGY Frequent Urination?: No Hard to postpone urination?: Yes Burning/pain with urination?: Yes Get up at  night to urinate?: Yes Leakage of urine?: No Urine stream starts and stops?: Yes Trouble starting stream?: Yes Do you have to strain to urinate?: Yes Blood in urine?: No Urinary tract infection?: No Sexually transmitted disease?: No Injury to kidneys or bladder?: No Painful intercourse?: No Weak stream?: Yes Currently pregnant?: No Vaginal bleeding?: No Last menstrual period?: n  Gastrointestinal Nausea?: Yes Vomiting?: No Indigestion/heartburn?: No Diarrhea?: No Constipation?: Yes  Constitutional Fever: No Night sweats?: No Weight loss?: No Fatigue?: No  Skin Skin rash/lesions?: Yes Itching?: No  Eyes Blurred vision?: Yes Double vision?: No  Ears/Nose/Throat Sore throat?: No Sinus problems?: No  Hematologic/Lymphatic Swollen glands?: No Easy bruising?: Yes  Cardiovascular Leg swelling?: No Chest pain?: No  Respiratory Cough?: Yes Shortness of breath?: Yes  Endocrine Excessive thirst?: No  Musculoskeletal Back pain?: Yes Joint pain?: No  Neurological Headaches?: Yes Dizziness?: Yes  Psychologic Depression?: No Anxiety?: Yes  Physical Exam: BP (!) 175/91 (BP Location: Left Arm, Patient Position: Sitting, Cuff Size: Normal)   Pulse (!) 56   Ht 5\' 6"  (1.676 m)   BMI 19.63 kg/m   Constitutional: Awake and alert, thin and frail appearing, no acute distress, nontoxic appearing HEENT: [place], AT Cardiovascular: No clubbing, cyanosis, or edema Respiratory: Normal respiratory effort, no increased work of breathing GU: Several prominent superficial clogged pores along the left labia majora and bilateral inguinal creases Lymph: No inguinal lymphadenopathy Skin: No rashes, bruises or suspicious lesions Neurologic: Grossly intact, no focal deficits, moving all 4 extremities Psychiatric: Anxious mood and affect  Laboratory Data: Results for orders placed or performed in visit on 11/17/18  Microscopic Examination   URINE  Result Value Ref Range    WBC, UA 0-5 0 - 5 /hpf   RBC None seen 0 - 2 /hpf   Epithelial Cells (non renal) 0-10 0 - 10 /hpf   Renal Epithel, UA 0-10 (A) None seen /hpf   Bacteria, UA None seen None seen/Few  Urinalysis, Complete  Result Value Ref Range   Specific Gravity, UA 1.010 1.005 - 1.030   pH, UA 5.5 5.0 - 7.5   Color, UA Orange Yellow   Appearance Ur Hazy (A) Clear   Leukocytes,UA Negative Negative   Protein,UA Negative Negative/Trace   Glucose, UA Trace (A) Negative   Ketones, UA Negative Negative   RBC, UA Negative Negative   Bilirubin, UA Negative Negative   Urobilinogen, Ur 0.2 0.2 - 1.0 mg/dL   Nitrite, UA Positive (A) Negative   Microscopic Examination See below:    Assessment & Plan:   1. History of hematuria Patient due for follow-up UA for monitoring of her hematuria.  No MH today. - Urinalysis, Complete  2. Vaginal atrophy Patient with known history of vaginal atrophy.  Topical estrogen cream contraindicated given personal history of breast cancer.  I advised her that it may be too soon for her to notice the benefits of the topical clobetasol cream prescribed to her by gynecology last month.  I advised her to continue this for symptom palliation and augment her treatment with an over-the-counter vaginal moisturizer.  I informed her that if this did not provide sufficient symptom palliation, she could try topical A&E ointment.  She expressed understanding.  Additionally, her reported vulvar and inguinal bumps were found to be superficial clogged pores.  I am not concerned for lymphadenopathy at this time.  3. Dysuria UA reassuring for infection today.  Will send for culture, however I do not expect this to be positive.  Patient reports Azo has not palliated her symptoms.  I find this consistent with my believe that her burning is located in the vulva and secondary to #2 above and not an active urinary tract infection. - Urinalysis, Complete - CULTURE, URINE COMPREHENSIVE  4. Difficulty  urinating Patient with history of elevated PVR.  Somewhat elevated today, however not outside her normal baseline.  Recommend initiation of a trial of tamsulosin today. - Bladder Scan (Post Void Residual) in office - tamsulosin (FLOMAX) 0.4 MG CAPS capsule; Take 1 capsule (0.4 mg total) by mouth daily.  Dispense: 30 capsule; Refill: 0   Debroah Loop, PA-C  [place] [address] [place], [place] [address] [phone]

## 2018-11-17 NOTE — Telephone Encounter (Signed)
I just spoke with the patient via telephone to clarify that she should continue to use the clobetasol cream prescribed by gynecology nightly and start to take tamsulosin also nightly.  She expressed understanding.

## 2018-11-20 LAB — CULTURE, URINE COMPREHENSIVE

## 2018-12-14 ENCOUNTER — Ambulatory Visit: Payer: Medicare Other | Admitting: Urology

## 2018-12-28 ENCOUNTER — Other Ambulatory Visit: Payer: Self-pay | Admitting: Physician Assistant

## 2018-12-28 DIAGNOSIS — R39198 Other difficulties with micturition: Secondary | ICD-10-CM

## 2018-12-28 NOTE — Telephone Encounter (Signed)
Patient aware flomax was refilled.

## 2018-12-28 NOTE — Telephone Encounter (Signed)
Spoke with patient about flomax refill.  She said the medication did help, but she is now out.  Patient is worried to come to the office.  Please advise refill.

## 2019-03-20 ENCOUNTER — Ambulatory Visit: Payer: Medicare PPO | Attending: Internal Medicine

## 2019-03-20 DIAGNOSIS — Z23 Encounter for immunization: Secondary | ICD-10-CM | POA: Insufficient documentation

## 2019-03-20 NOTE — Progress Notes (Signed)
   Covid-19 Vaccination Clinic  Name:  JENIELLE MATUSZAK    MRN: [id number] DOB: 14-Jun-1931  03/20/2019  Ms. Houtman was observed post Covid-19 immunization for 15 minutes without incidence. She was provided with Vaccine Information Sheet and instruction to access the V-Safe system.   Ms. Tango was instructed to call 911 with any severe reactions post vaccine: [name] Difficulty breathing  . Swelling of your face and throat  . A fast heartbeat  . A bad rash all over your body  . Dizziness and weakness    Immunizations Administered    Name Date Dose VIS Date Route   Pfizer COVID-19 Vaccine 03/20/2019  2:45 PM 0.3 mL 02/05/2019 Intramuscular   Manufacturer: [place]   Lot: [id number]   [place]: [id number]

## 2019-04-11 ENCOUNTER — Ambulatory Visit: Payer: Medicare PPO | Attending: Internal Medicine

## 2019-04-11 DIAGNOSIS — Z23 Encounter for immunization: Secondary | ICD-10-CM

## 2019-04-11 NOTE — Progress Notes (Signed)
   Covid-19 Vaccination Clinic  Name:  Christine Vaughan    MRN: [id number] DOB: [birth year]  04/11/2019  Christine Vaughan was observed post Covid-19 immunization for 15 minutes without incidence. She was provided with Vaccine Information Sheet and instruction to access the V-Safe system.   Christine Vaughan was instructed to call 911 with any severe reactions post vaccine: [name] Difficulty breathing  . Swelling of your face and throat  . A fast heartbeat  . A bad rash all over your body  . Dizziness and weakness    Immunizations Administered    Name Date Dose VIS Date Route   Pfizer COVID-19 Vaccine 04/11/2019  1:46 PM 0.3 mL 02/05/2019 Intramuscular   Manufacturer: [place]   Lot: [id number]   [place]: [id number]

## 2019-05-11 ENCOUNTER — Ambulatory Visit: Payer: Medicare PPO | Admitting: Podiatry

## 2019-05-11 ENCOUNTER — Ambulatory Visit: Payer: Medicare Other | Admitting: Podiatry

## 2019-05-11 ENCOUNTER — Other Ambulatory Visit: Payer: Self-pay

## 2019-05-11 DIAGNOSIS — M79676 Pain in unspecified toe(s): Secondary | ICD-10-CM | POA: Diagnosis not present

## 2019-05-11 DIAGNOSIS — M722 Plantar fascial fibromatosis: Secondary | ICD-10-CM | POA: Diagnosis not present

## 2019-05-11 DIAGNOSIS — B351 Tinea unguium: Secondary | ICD-10-CM

## 2019-05-11 MED ORDER — MELOXICAM 7.5 MG PO TABS: 7.5000 mg | ORAL_TABLET | Freq: Every day | ORAL | 2 refills | Status: DC

## 2019-05-14 NOTE — Progress Notes (Signed)
   SUBJECTIVE Patient presents to office today complaining of elongated, thickened nails that cause pain while ambulating in shoes. She is unable to trim her own nails. She also complains of sharp pain in the right heel that began one week ago. Being on the foot and walking on it for a while helps reduce the pain. She states her symptoms are worse in the morning when she first gets up in the morning. She has not had any treatment for the symptoms. Patient is here for further evaluation and treatment.  Past Medical History:  Diagnosis Date  . Anxiety   . Breast cancer, right ([place]) 2001   Right Lumpectomy, chemo + rad tx's.   . Chronic kidney disease   . Hereditary and idiopathic peripheral neuropathy 12/27/2015  . Hypertension   . MAI (mycobacterium avium-intracellulare) ([place])   . Thyroid disease     OBJECTIVE General Patient is awake, alert, and oriented x 3 and in no acute distress. Derm Skin is dry and supple bilateral. Negative open lesions or macerations. Remaining integument unremarkable. Nails are tender, long, thickened and dystrophic with subungual debris, consistent with onychomycosis, 1-5 bilateral. No signs of infection noted. Vasc  DP and PT pedal pulses palpable bilaterally. Temperature gradient within normal limits.  Neuro Epicritic and protective threshold sensation grossly intact bilaterally.  Musculoskeletal Exam Pain with palpation noted to the right heel along the plantar fascia. No symptomatic pedal deformities noted bilateral. Muscular strength within normal limits.  ASSESSMENT 1. Onychodystrophic nails 1-5 bilateral with hyperkeratosis of nails.  2. Onychomycosis of nail due to dermatophyte bilateral 3. Plantar fasciitis right   PLAN OF CARE 1. Patient evaluated today.  2. Instructed to maintain good pedal hygiene and foot care.  3. Mechanical debridement of nails 1-5 bilaterally performed using a nail nipper. Filed with dremel without incident.  4. Injection of  0.5 mLs Celestone Soluspan injected into the right heel.  5. Prescription for Meloxicam 7.5 mg provided to patient. 6. Return to clinic as needed.    Edrick Kins, DPM [facility]  Dr. Edrick Kins, [address]                                        [place], [place] [address]                Office [phone]  Fax [phone]

## 2019-06-08 ENCOUNTER — Inpatient Hospital Stay
Admission: EM | Admit: 2019-06-08 | Discharge: 2019-06-11 | DRG: 069 | Disposition: A | Discharge status: Home Health | Payer: Medicare PPO | Attending: Internal Medicine | Admitting: Internal Medicine

## 2019-06-08 ENCOUNTER — Emergency Department: Payer: Medicare PPO

## 2019-06-08 ENCOUNTER — Encounter: Payer: Self-pay | Admitting: *Deleted

## 2019-06-08 ENCOUNTER — Other Ambulatory Visit: Payer: Self-pay

## 2019-06-08 DIAGNOSIS — G459 Transient cerebral ischemic attack, unspecified: Principal | ICD-10-CM

## 2019-06-08 DIAGNOSIS — I951 Orthostatic hypotension: Secondary | ICD-10-CM

## 2019-06-08 DIAGNOSIS — R531 Weakness: Secondary | ICD-10-CM

## 2019-06-08 DIAGNOSIS — Z20822 Contact with and (suspected) exposure to covid-19: Secondary | ICD-10-CM | POA: Diagnosis present

## 2019-06-08 DIAGNOSIS — E871 Hypo-osmolality and hyponatremia: Secondary | ICD-10-CM

## 2019-06-08 DIAGNOSIS — Z803 Family history of malignant neoplasm of breast: Secondary | ICD-10-CM

## 2019-06-08 DIAGNOSIS — Z79890 Hormone replacement therapy (postmenopausal): Secondary | ICD-10-CM

## 2019-06-08 DIAGNOSIS — J479 Bronchiectasis, uncomplicated: Secondary | ICD-10-CM

## 2019-06-08 DIAGNOSIS — I129 Hypertensive chronic kidney disease with stage 1 through stage 4 chronic kidney disease, or unspecified chronic kidney disease: Secondary | ICD-10-CM | POA: Diagnosis present

## 2019-06-08 DIAGNOSIS — Z79891 Long term (current) use of opiate analgesic: Secondary | ICD-10-CM

## 2019-06-08 DIAGNOSIS — Z79899 Other long term (current) drug therapy: Secondary | ICD-10-CM

## 2019-06-08 DIAGNOSIS — Z808 Family history of malignant neoplasm of other organs or systems: Secondary | ICD-10-CM

## 2019-06-08 DIAGNOSIS — E876 Hypokalemia: Secondary | ICD-10-CM | POA: Diagnosis present

## 2019-06-08 DIAGNOSIS — Z818 Family history of other mental and behavioral disorders: Secondary | ICD-10-CM

## 2019-06-08 DIAGNOSIS — N189 Chronic kidney disease, unspecified: Secondary | ICD-10-CM | POA: Diagnosis present

## 2019-06-08 DIAGNOSIS — F410 Panic disorder [episodic paroxysmal anxiety] without agoraphobia: Secondary | ICD-10-CM | POA: Diagnosis present

## 2019-06-08 DIAGNOSIS — E1122 Type 2 diabetes mellitus with diabetic chronic kidney disease: Secondary | ICD-10-CM | POA: Diagnosis present

## 2019-06-08 DIAGNOSIS — R4781 Slurred speech: Secondary | ICD-10-CM | POA: Diagnosis present

## 2019-06-08 DIAGNOSIS — A310 Pulmonary mycobacterial infection: Secondary | ICD-10-CM | POA: Diagnosis present

## 2019-06-08 DIAGNOSIS — I10 Essential (primary) hypertension: Secondary | ICD-10-CM | POA: Diagnosis present

## 2019-06-08 DIAGNOSIS — E039 Hypothyroidism, unspecified: Secondary | ICD-10-CM | POA: Diagnosis present

## 2019-06-08 DIAGNOSIS — Z853 Personal history of malignant neoplasm of breast: Secondary | ICD-10-CM

## 2019-06-08 LAB — BASIC METABOLIC PANEL
Anion gap: 9 (ref 5–15)
BUN: 29 mg/dL — ABNORMAL HIGH (ref 8–23)
CO2: 19 mmol/L — ABNORMAL LOW (ref 22–32)
Calcium: 7.6 mg/dL — ABNORMAL LOW (ref 8.9–10.3)
Chloride: 98 mmol/L (ref 98–111)
Creatinine, Ser: 0.94 mg/dL (ref 0.44–1.00)
GFR calc Af Amer: >60 mL/min (ref >60)
GFR calc non Af Amer: 55 mL/min — ABNORMAL LOW (ref >60)
Glucose, Bld: 118 mg/dL — ABNORMAL HIGH (ref 70–99)
Potassium: 3.1 mmol/L — ABNORMAL LOW (ref 3.5–5.1)
Sodium: 126 mmol/L — ABNORMAL LOW (ref 135–145)

## 2019-06-08 LAB — URINALYSIS, COMPLETE (UACMP) WITH MICROSCOPIC
Bacteria, UA: NONE SEEN
Bilirubin Urine: NEGATIVE
Glucose, UA: NEGATIVE mg/dL
Hgb urine dipstick: NEGATIVE
Ketones, ur: NEGATIVE mg/dL
Leukocytes,Ua: NEGATIVE
Nitrite: NEGATIVE
Protein, ur: NEGATIVE mg/dL
Specific Gravity, Urine: 1.006 (ref 1.005–1.030)
pH: 5 (ref 5.0–8.0)

## 2019-06-08 LAB — CBC
HCT: 31 % — ABNORMAL LOW (ref 36.0–46.0)
Hemoglobin: 10.3 g/dL — ABNORMAL LOW (ref 12.0–15.0)
MCH: 32.3 pg (ref 26.0–34.0)
MCHC: 33.2 g/dL (ref 30.0–36.0)
MCV: 97.2 fL (ref 80.0–100.0)
Platelets: 253 10*3/uL (ref 150–400)
RBC: 3.19 MIL/uL — ABNORMAL LOW (ref 3.87–5.11)
RDW: 13.3 % (ref 11.5–15.5)
WBC: 8.3 10*3/uL (ref 4.0–10.5)
nRBC: 0 % (ref 0.0–0.2)

## 2019-06-08 MED ORDER — SODIUM CHLORIDE 0.9 % IV BOLUS
500.0000 mL | Freq: Once | INTRAVENOUS | Status: AC
  Administered 2019-06-08: 500 mL via INTRAVENOUS

## 2019-06-08 MED ORDER — SODIUM CHLORIDE 0.9 % IV BOLUS
500.0000 mL | Freq: Once | INTRAVENOUS | Status: AC
  Administered 2019-06-09: 500 mL via INTRAVENOUS

## 2019-06-08 NOTE — ED Notes (Signed)
Pt brought in via ems from home.  Pt had a near syncope episode.  No chest pain.  No headache.  Pt feels weak.  No v/d  Constipation for 3 days.  Pt alert  Speech clear.

## 2019-06-08 NOTE — ED Triage Notes (Signed)
Pt brought in via ems from home.  Pt reports weakness.  fsbs 112 per ems.  Pt alert on arrival to er.

## 2019-06-08 NOTE — ED Notes (Signed)
Family with pt

## 2019-06-08 NOTE — ED Notes (Signed)
md in with pt again  

## 2019-06-08 NOTE — ED Provider Notes (Signed)
[facility] Emergency Department Provider Note    First MD Initiated Contact with Patient 06/08/19 2131     (approximate)  I have reviewed the triage vital signs and the nursing notes.   HISTORY  Chief Complaint Weakness    HPI Christine Vaughan is a 86 y.o. female below listed past medical history presents to the ER for evaluation of slurred speech and weakness that occurred this evening.  She does take daily tobramycin inhalers and states that after she was taking this was up in the bathroom started feeling weakness and having slurred speech that was witnessed by family member lasting roughly 5 to 10 minutes.  Patient was also feel like she was about to faint.  Has been on Levaquin as well.  Did not fully lose consciousness or fall but family member states that she was unresponsive.    Past Medical History:  Diagnosis Date  . Anxiety   . Breast cancer, right ([place]) 2001   Right Lumpectomy, chemo + rad tx's.   . Chronic kidney disease   . Hereditary and idiopathic peripheral neuropathy 12/27/2015  . Hypertension   . MAI (mycobacterium avium-intracellulare) ([place])   . Thyroid disease    Family History  Problem Relation Age of Onset  . Depression Mother   . Brain cancer Mother   . Breast cancer Paternal Aunt    Past Surgical History:  Procedure Laterality Date  . BACK SURGERY    . BREAST BIOPSY Left    surgical bx pt dose not remember  . BREAST EXCISIONAL BIOPSY Right 2001   + chem rad and armidex  . BREAST LUMPECTOMY  2001  . BREAST SURGERY    . CHOLECYSTECTOMY    . ESOPHAGOGASTRODUODENOSCOPY (EGD) WITH PROPOFOL N/A 09/20/2015   Procedure: ESOPHAGOGASTRODUODENOSCOPY (EGD) WITH PROPOFOL;  Surgeon: Lollie Sails, MD;  Location: [facility] ENDOSCOPY;  Service: Endoscopy;  Laterality: N/A;   Patient Active Problem List   Diagnosis Date Noted  . Chronic deep vein thrombosis (DVT) of right lower extremity ([place]) 03/25/2018  . Pulmonary embolus ([place])  03/25/2018  . Use of cane as ambulatory aid 01/15/2018  . MAI (mycobacterium avium-intracellulare) infection ([place]) 06/16/2017  . Headache syndrome 12/27/2015  . Memory change 12/27/2015  . Vitamin B12 deficiency 12/27/2015  . Hereditary and idiopathic peripheral neuropathy 12/27/2015  . Abnormality of gait 12/27/2015  . Hypertension   . Health care maintenance 10/12/2014  . Mixed hyperlipidemia 10/07/2013  . Trigeminal neuralgia 09/19/2013  . Panic anxiety syndrome 09/19/2013  . Osteopenia 09/19/2013  . Hypothyroid 09/19/2013  . 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 Depression, major, in remission ([place]) 09/19/2013  . Bronchiectasis ([place]) 09/19/2013  . History of radiation therapy 07/15/2011  . Breast cancer ([place]) 07/15/2011  . 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 Optic neuropathy 12/31/2010  . Nuclear cataract 12/31/2010  . Glaucoma suspect 12/31/2010      Prior to Admission medications   Medication Sig Start Date End Date Taking? Authorizing Provider  acetaminophen (TYLENOL) 500 MG tablet Take 250 mg by mouth daily as needed.     [provider]  albuterol (PROVENTIL) (5 MG/ML) 0.5% nebulizer solution Take 2.5 mg by nebulization every 6 (six) hours as needed for wheezing or shortness of breath.    [provider]  azelastine (ASTELIN) 0.1 % nasal spray Place into the nose 2 (two) times daily. Use in each nostril as directed    [provider]  dicyclomine (BENTYL)  20 MG tablet Take 20 mg by mouth every 6 (six) hours.    [provider]  esomeprazole (NEXIUM) 40 MG capsule TAKE 1 CAPSULE BY MOUTH TWICE DAILY. TAKE 30 MINUTES BEFORE MEALS. 12/24/16   [provider]  gabapentin (NEURONTIN) 100 MG capsule Take 2 capsules (200 mg total) by mouth at bedtime. 08/31/18   Rainey Pines, MD  gabapentin (NEURONTIN) 100 MG capsule 1-2 pills at night 11/09/18   Rainey Pines, MD  gentamicin cream (GARAMYCIN)  0.1 % Apply 1 application topically 2 (two) times daily. 07/28/18   Edrick Kins, DPM  ipratropium (ATROVENT) 0.06 % nasal spray Place 2 sprays into the nose 2 (two) times daily as needed. 03/19/18 03/19/19  [provider]  levothyroxine (SYNTHROID, LEVOTHROID) 50 MCG tablet Take 50 mcg by mouth daily before breakfast.    [provider]  meloxicam (MOBIC) 7.5 MG tablet Take 1 tablet (7.5 mg total) by mouth daily. 05/11/19   Edrick Kins, DPM  metoprolol succinate (TOPROL-XL) 50 MG 24 hr tablet Take 1 tablet by mouth daily. 03/19/18   [provider]  sertraline (ZOLOFT) 25 MG tablet Take 1 tablet (25 mg total) by mouth daily. 11/09/18   Rainey Pines, MD  tamsulosin (FLOMAX) 0.4 MG CAPS capsule TAKE 1 CAPSULE EVERY DAY 12/28/18   Vaillancourt, Samantha, PA-C  tobramycin, PF, (TOBI) 300 MG/5ML nebulizer solution Take 300 mg by nebulization 2 (two) times daily.    [provider]  traMADol (ULTRAM) 50 MG tablet Take 1 tablet (50 mg total) by mouth every 6 (six) hours as needed. 03/12/18   Carrie Mew, MD    Allergies Ciprofloxacin, Erythromycin, Metronidazole, Sulfur, Tizanidine, Penicillins, and Sulfamethoxazole    Social History Social History   Tobacco Use  . Smoking status: Never Smoker  . Smokeless tobacco: Never Used  Substance Use Topics  . Alcohol use: No  . Drug use: No    Review of Systems Patient denies headaches, rhinorrhea, blurry vision, numbness, shortness of breath, chest pain, edema, cough, abdominal pain, nausea, vomiting, diarrhea, dysuria, fevers, rashes or hallucinations unless otherwise stated above in HPI. ____________________________________________   PHYSICAL EXAM:  VITAL SIGNS: Vitals:   06/08/19 2200 06/08/19 2230  BP: 117/70 (!) 124/91  Pulse: 82   Resp: (!) 27 (!) 24  Temp:    SpO2: 100%     Constitutional: Alert and oriented.  Eyes: Conjunctivae are normal.  Head: Atraumatic. Nose: No  congestion/rhinnorhea. Mouth/Throat: Mucous membranes are moist.   Neck: No stridor. Painless ROM.  Cardiovascular: Normal rate, regular rhythm. Grossly normal heart sounds.  Good peripheral circulation. Respiratory: Normal respiratory effort.  No retractions. Lungs CTAB. Gastrointestinal: Soft and nontender. No distention. No abdominal bruits. No CVA tenderness. Genitourinary:  Musculoskeletal: No lower extremity tenderness nor edema.  No joint effusions. Neurologic:  Normal speech and language. No gross focal neurologic deficits are appreciated. No facial droop Skin:  Skin is warm, dry and intact. No rash noted. Psychiatric: Mood and affect are normal. Speech and behavior are normal.  ____________________________________________   LABS (all labs ordered are listed, but only abnormal results are displayed)  Results for orders placed or performed during the hospital encounter of 06/08/19 (from the past 24 hour(s))  Basic metabolic panel     Status: Abnormal   Collection Time: 06/08/19  9:36 PM  Result Value Ref Range   Sodium 126 (L) 135 - 145 mmol/L   Potassium 3.1 (L) 3.5 - 5.1 mmol/L   Chloride 98 98 -  111 mmol/L   CO2 19 (L) 22 - 32 mmol/L   Glucose, Bld 118 (H) 70 - 99 mg/dL   BUN 29 (H) 8 - 23 mg/dL   Creatinine, Ser 0.94 0.44 - 1.00 mg/dL   Calcium 7.6 (L) 8.9 - 10.3 mg/dL   GFR calc non Af Amer 55 (L) >60 mL/min   GFR calc Af Amer >60 >60 mL/min   Anion gap 9 5 - 15  CBC     Status: Abnormal   Collection Time: 06/08/19  9:36 PM  Result Value Ref Range   WBC 8.3 4.0 - 10.5 K/uL   RBC 3.19 (L) 3.87 - 5.11 MIL/uL   Hemoglobin 10.3 (L) 12.0 - 15.0 g/dL   HCT 31.0 (L) 36.0 - 46.0 %   MCV 97.2 80.0 - 100.0 fL   MCH 32.3 26.0 - 34.0 pg   MCHC 33.2 30.0 - 36.0 g/dL   RDW 13.3 11.5 - 15.5 %   Platelets 253 150 - 400 K/uL   nRBC 0.0 0.0 - 0.2 %  Urinalysis, Complete w Microscopic     Status: Abnormal   Collection Time: 06/08/19 10:02 PM  Result Value Ref Range   Color,  Urine YELLOW (A) YELLOW   APPearance CLEAR (A) CLEAR   Specific Gravity, Urine 1.006 1.005 - 1.030   pH 5.0 5.0 - 8.0   Glucose, UA NEGATIVE NEGATIVE mg/dL   Hgb urine dipstick NEGATIVE NEGATIVE   Bilirubin Urine NEGATIVE NEGATIVE   Ketones, ur NEGATIVE NEGATIVE mg/dL   Protein, ur NEGATIVE NEGATIVE mg/dL   Nitrite NEGATIVE NEGATIVE   Leukocytes,Ua NEGATIVE NEGATIVE   RBC / HPF 0-5 0 - 5 RBC/hpf   WBC, UA 0-5 0 - 5 WBC/hpf   Bacteria, UA NONE SEEN NONE SEEN   Squamous Epithelial / LPF 0-5 0 - 5   ____________________________________________  EKG My review and personal interpretation at Time: 21:28   Indication: weakness  Rate: 85  Rhythm: sinus Axis: normal Other: normal intervals, no stemi, lvh criteria ____________________________________________  RADIOLOGY  I personally reviewed all radiographic images ordered to evaluate for the above acute complaints and reviewed radiology reports and findings.  These findings were personally discussed with the patient.  Please see medical record for radiology report.  ____________________________________________   PROCEDURES  Procedure(s) performed:  Procedures    Critical Care performed: no ____________________________________________   INITIAL IMPRESSION / ASSESSMENT AND PLAN / ED COURSE  Pertinent labs & imaging results that were available during my care of the patient were reviewed by me and considered in my medical decision making (see chart for details).   DDX: cva, tia, hypoglycemia, dehydration, electrolyte abnormality, dissection, sepsis   SOON LUCADO is a 86 y.o. who presents to the ED with symptoms as described above.  Patient with what sounds like near syncopal or TIA symptoms.  Does have evidence of hyponatremia.  Will give IV fluids.  She does not have any focal deficits at this time but family describes what they felt like were strokelike symptoms that is since resolved.  CT head shows chronic microvascular  changes.  She is not hypoxic.  No evidence of UTI.  Will give IV fluids for her hyponatremia discussed with hospitalist for admission.     The patient was evaluated in Emergency Department today for the symptoms described in the history of present illness. He/she was evaluated in the context of the global COVID-19 pandemic, which necessitated consideration that the patient might be at risk for infection with  the SARS-CoV-2 virus that causes COVID-19. Institutional protocols and algorithms that pertain to the evaluation of patients at risk for COVID-19 are in a state of rapid change based on information released by regulatory bodies including the CDC and federal and state organizations. These policies and algorithms were followed during the patient's care in the ED.  As part of my medical decision making, I reviewed the following data within the [place] notes reviewed and incorporated, Labs reviewed, notes from prior ED visits and [place] Controlled Substance Database   ____________________________________________   FINAL CLINICAL IMPRESSION(S) / ED DIAGNOSES  Final diagnoses:  Weakness  TIA (transient ischemic attack)  Hyponatremia      NEW MEDICATIONS STARTED DURING THIS VISIT:  New Prescriptions   No medications on file     Note:  This document was prepared using Dragon voice recognition software and may include unintentional dictation errors.    Merlyn Lot, MD 06/08/19 2350

## 2019-06-09 ENCOUNTER — Observation Stay
Admit: 2019-06-09 | Discharge: 2019-06-09 | Disposition: A | Discharge status: Home / Self Care | Payer: Medicare PPO | Attending: Internal Medicine | Admitting: Internal Medicine

## 2019-06-09 ENCOUNTER — Observation Stay: Admit: 2019-06-09 | Payer: Medicare PPO

## 2019-06-09 ENCOUNTER — Encounter: Payer: Self-pay | Admitting: Internal Medicine

## 2019-06-09 ENCOUNTER — Observation Stay: Payer: Medicare PPO

## 2019-06-09 DIAGNOSIS — E876 Hypokalemia: Secondary | ICD-10-CM | POA: Diagnosis present

## 2019-06-09 DIAGNOSIS — G459 Transient cerebral ischemic attack, unspecified: Secondary | ICD-10-CM | POA: Diagnosis not present

## 2019-06-09 DIAGNOSIS — E871 Hypo-osmolality and hyponatremia: Secondary | ICD-10-CM

## 2019-06-09 LAB — RESPIRATORY PANEL BY RT PCR (FLU A&B, COVID)

## 2019-06-09 LAB — OSMOLALITY: Osmolality: 278 mOsm/kg (ref 275–295)

## 2019-06-09 LAB — LIPID PANEL
Cholesterol: 215 mg/dL — ABNORMAL HIGH (ref 0–200)
HDL: 71 mg/dL (ref >40)
LDL Cholesterol: 132 mg/dL — ABNORMAL HIGH (ref 0–99)
Total CHOL/HDL Ratio: 3 RATIO
Triglycerides: 62 mg/dL (ref <150)
VLDL: 12 mg/dL (ref 0–40)

## 2019-06-09 LAB — BASIC METABOLIC PANEL
Anion gap: 8 (ref 5–15)
BUN: 27 mg/dL — ABNORMAL HIGH (ref 8–23)
CO2: 25 mmol/L (ref 22–32)
Calcium: 8.5 mg/dL — ABNORMAL LOW (ref 8.9–10.3)
Chloride: 99 mmol/L (ref 98–111)
Creatinine, Ser: 0.96 mg/dL (ref 0.44–1.00)
GFR calc Af Amer: >60 mL/min (ref >60)
GFR calc non Af Amer: 54 mL/min — ABNORMAL LOW (ref >60)
Glucose, Bld: 93 mg/dL (ref 70–99)
Potassium: 3.7 mmol/L (ref 3.5–5.1)
Sodium: 132 mmol/L — ABNORMAL LOW (ref 135–145)

## 2019-06-09 LAB — OSMOLALITY, URINE: Osmolality, Ur: 251 mOsm/kg — ABNORMAL LOW (ref 300–900)

## 2019-06-09 LAB — PHOSPHORUS: Phosphorus: 2.9 mg/dL (ref 2.5–4.6)

## 2019-06-09 LAB — SODIUM, URINE, RANDOM: Sodium, Ur: 19 mmol/L

## 2019-06-09 LAB — SARS CORONAVIRUS 2 (TAT 6-24 HRS): SARS Coronavirus 2: NEGATIVE

## 2019-06-09 LAB — ECHOCARDIOGRAM COMPLETE
Height: 64 in
Weight: 2139.2 oz

## 2019-06-09 LAB — TROPONIN I (HIGH SENSITIVITY)
Troponin I (High Sensitivity): 2 ng/L (ref <18)
Troponin I (High Sensitivity): 8 ng/L (ref <18)

## 2019-06-09 LAB — MAGNESIUM: Magnesium: 2.1 mg/dL (ref 1.7–2.4)

## 2019-06-09 MED ORDER — ACETAMINOPHEN 325 MG PO TABS: 650.0000 mg | ORAL_TABLET | ORAL | Status: DC | PRN

## 2019-06-09 MED ORDER — METOPROLOL SUCCINATE ER 50 MG PO TB24
50.0000 mg | ORAL_TABLET | Freq: Every day | ORAL | Status: DC
  Administered 2019-06-09 – 2019-06-10 (×2): 50 mg via ORAL
  Filled 2019-06-09 (×2): qty 1

## 2019-06-09 MED ORDER — LEVOFLOXACIN 500 MG PO TABS
250.0000 mg | ORAL_TABLET | Freq: Every day | ORAL | Status: AC
  Administered 2019-06-09 – 2019-06-10 (×2): 250 mg via ORAL
  Filled 2019-06-09 (×2): qty 1

## 2019-06-09 MED ORDER — DOCUSATE SODIUM 100 MG PO CAPS: 100.0000 mg | ORAL_CAPSULE | Freq: Every day | ORAL | Status: DC | PRN

## 2019-06-09 MED ORDER — STROKE: EARLY STAGES OF RECOVERY BOOK: Freq: Once | Status: DC

## 2019-06-09 MED ORDER — TRAZODONE HCL 50 MG PO TABS: 50.0000 mg | ORAL_TABLET | Freq: Every evening | ORAL | Status: DC | PRN

## 2019-06-09 MED ORDER — DICYCLOMINE HCL 20 MG PO TABS
20.0000 mg | ORAL_TABLET | Freq: Four times a day (QID) | ORAL | Status: DC
  Administered 2019-06-09 – 2019-06-11 (×8): 20 mg via ORAL
  Filled 2019-06-09 (×11): qty 1

## 2019-06-09 MED ORDER — ASPIRIN 300 MG RE SUPP: 300.0000 mg | Freq: Every day | RECTAL | Status: DC

## 2019-06-09 MED ORDER — SODIUM CHLORIDE 0.9 % IV SOLN: INTRAVENOUS | Status: DC

## 2019-06-09 MED ORDER — POTASSIUM CHLORIDE IN NACL 20-0.9 MEQ/L-% IV SOLN
INTRAVENOUS | Status: DC
  Filled 2019-06-09 (×4): qty 1000

## 2019-06-09 MED ORDER — ALBUTEROL SULFATE (2.5 MG/3ML) 0.083% IN NEBU: 2.5000 mg | INHALATION_SOLUTION | Freq: Four times a day (QID) | RESPIRATORY_TRACT | Status: DC | PRN

## 2019-06-09 MED ORDER — MECLIZINE HCL 12.5 MG PO TABS
12.5000 mg | ORAL_TABLET | Freq: Three times a day (TID) | ORAL | Status: DC | PRN
  Administered 2019-06-09: 10:00:00 12.5 mg via ORAL
  Filled 2019-06-09 (×2): qty 1

## 2019-06-09 MED ORDER — GABAPENTIN 100 MG PO CAPS
200.0000 mg | ORAL_CAPSULE | Freq: Every day | ORAL | Status: DC
  Administered 2019-06-10: 22:00:00 200 mg via ORAL
  Filled 2019-06-09: qty 2

## 2019-06-09 MED ORDER — ACETAMINOPHEN 650 MG RE SUPP: 650.0000 mg | RECTAL | Status: DC | PRN

## 2019-06-09 MED ORDER — PANTOPRAZOLE SODIUM 40 MG PO TBEC
40.0000 mg | DELAYED_RELEASE_TABLET | Freq: Every day | ORAL | Status: DC
  Administered 2019-06-09 – 2019-06-11 (×3): 40 mg via ORAL
  Filled 2019-06-09 (×3): qty 1

## 2019-06-09 MED ORDER — ENOXAPARIN SODIUM 40 MG/0.4ML ~~LOC~~ SOLN
40.0000 mg | SUBCUTANEOUS | Status: DC
  Administered 2019-06-09 – 2019-06-11 (×3): 40 mg via SUBCUTANEOUS
  Filled 2019-06-09 (×3): qty 0.4

## 2019-06-09 MED ORDER — TAMSULOSIN HCL 0.4 MG PO CAPS
0.4000 mg | ORAL_CAPSULE | Freq: Every day | ORAL | Status: DC
  Administered 2019-06-09 – 2019-06-11 (×3): 0.4 mg via ORAL
  Filled 2019-06-09 (×3): qty 1

## 2019-06-09 MED ORDER — LORAZEPAM 2 MG/ML IJ SOLN
0.5000 mg | Freq: Once | INTRAMUSCULAR | Status: AC
  Administered 2019-06-09: 0.5 mg via INTRAVENOUS

## 2019-06-09 MED ORDER — ASPIRIN EC 325 MG PO TBEC
325.0000 mg | DELAYED_RELEASE_TABLET | Freq: Every day | ORAL | Status: DC
  Administered 2019-06-09 – 2019-06-11 (×3): 325 mg via ORAL
  Filled 2019-06-09 (×3): qty 1

## 2019-06-09 MED ORDER — LORAZEPAM 0.5 MG PO TABS
0.2500 mg | ORAL_TABLET | Freq: Two times a day (BID) | ORAL | Status: DC | PRN
  Administered 2019-06-09: 15:00:00 0.25 mg via ORAL
  Filled 2019-06-09: qty 1

## 2019-06-09 MED ORDER — POLYETHYLENE GLYCOL 3350 17 G PO PACK
17.0000 g | PACK | Freq: Every day | ORAL | Status: DC
  Administered 2019-06-09 – 2019-06-11 (×3): 17 g via ORAL
  Filled 2019-06-09 (×3): qty 1

## 2019-06-09 MED ORDER — SERTRALINE HCL 50 MG PO TABS
25.0000 mg | ORAL_TABLET | Freq: Every day | ORAL | Status: DC
  Administered 2019-06-09 – 2019-06-11 (×3): 25 mg via ORAL
  Filled 2019-06-09 (×3): qty 1

## 2019-06-09 MED ORDER — AZELASTINE HCL 0.1 % NA SOLN
1.0000 | Freq: Two times a day (BID) | NASAL | Status: DC
  Administered 2019-06-09 – 2019-06-11 (×4): 1 via NASAL
  Filled 2019-06-09: qty 30

## 2019-06-09 MED ORDER — ACETAMINOPHEN 160 MG/5ML PO SOLN
650.0000 mg | ORAL | Status: DC | PRN
  Filled 2019-06-09: qty 20.3

## 2019-06-09 MED ORDER — TOBRAMYCIN 300 MG/5ML IN NEBU
300.0000 mg | INHALATION_SOLUTION | Freq: Two times a day (BID) | RESPIRATORY_TRACT | Status: DC
  Administered 2019-06-09 – 2019-06-11 (×4): 300 mg via RESPIRATORY_TRACT
  Filled 2019-06-09 (×6): qty 5

## 2019-06-09 MED ORDER — MELOXICAM 7.5 MG PO TABS
7.5000 mg | ORAL_TABLET | Freq: Every day | ORAL | Status: DC
  Administered 2019-06-09 – 2019-06-10 (×2): 7.5 mg via ORAL
  Filled 2019-06-09 (×2): qty 1

## 2019-06-09 MED ORDER — LORAZEPAM 2 MG/ML IJ SOLN
INTRAMUSCULAR | Status: AC
  Filled 2019-06-09: qty 1

## 2019-06-09 MED ORDER — SENNOSIDES-DOCUSATE SODIUM 8.6-50 MG PO TABS: 1.0000 | ORAL_TABLET | Freq: Every evening | ORAL | Status: DC | PRN

## 2019-06-09 MED ORDER — TRAMADOL HCL 50 MG PO TABS: 50.0000 mg | ORAL_TABLET | Freq: Four times a day (QID) | ORAL | Status: DC | PRN

## 2019-06-09 MED ORDER — LEVOTHYROXINE SODIUM 50 MCG PO TABS
50.0000 ug | ORAL_TABLET | Freq: Every day | ORAL | Status: DC
  Administered 2019-06-10 – 2019-06-11 (×2): 50 ug via ORAL
  Filled 2019-06-09 (×2): qty 1

## 2019-06-09 NOTE — ED Notes (Signed)
Pt requests monitor to be adjusted due to beep. When this nurse entered room there was no sound coming from monitor. Will continue to monitor.

## 2019-06-09 NOTE — Evaluation (Signed)
Clinical/Bedside Swallow Evaluation Patient Details  Name: Christine Vaughan MRN: [id number] Date of Birth: [birth year]  Today's Date: 06/09/2019 Time: SLP Start Time (ACUTE ONLY): 0840 SLP Stop Time (ACUTE ONLY): 0940 SLP Time Calculation (min) (ACUTE ONLY): 60 min  Past Medical History:  Past Medical History:  Diagnosis Date  . Anxiety   . Breast cancer, right ([place]) 2001   Right Lumpectomy, chemo + rad tx's.   . Chronic kidney disease   . Hereditary and idiopathic peripheral neuropathy 12/27/2015  . Hypertension   . MAI (mycobacterium avium-intracellulare) ([place])   . Thyroid disease    Past Surgical History:  Past Surgical History:  Procedure Laterality Date  . BACK SURGERY    . BREAST BIOPSY Left    surgical bx pt dose not remember  . BREAST EXCISIONAL BIOPSY Right 2001   + chem rad and armidex  . BREAST LUMPECTOMY  2001  . BREAST SURGERY    . CHOLECYSTECTOMY    . ESOPHAGOGASTRODUODENOSCOPY (EGD) WITH PROPOFOL N/A 09/20/2015   Procedure: ESOPHAGOGASTRODUODENOSCOPY (EGD) WITH PROPOFOL;  Surgeon: Lollie Sails, MD;  Location: [facility] ENDOSCOPY;  Service: Endoscopy;  Laterality: N/A;   HPI:  Pt is a 86 y.o. female with medical history significant for Panic anxiety syndrome, HOH w/ HAs, bronchiectasis and chronic MAC infection on tobramycin nebulizer, hypothyroidism and depression, who was brought into the emergency room after an episode of slurred speech and partial responsiveness. History is taken from the daughter at the bedside, who said her mother was in her usual state of health and was talking to her from the bathroom sink and then her speech became slurred and unintelligible.  She went quickly to check on her and sat sat her down in her rolling walker and at that point her mother slumped forward and was mumbling unintelligible words.  She did not lose consciousness but it appeared to her like she had zoned out and was not hearing what she was saying.  The episode lasted a few  minutes and by arrival in the emergency room, she was back to her usual self.  On arrival patient was alert and oriented x3 little recollection of the details of the event.  Head CT showed no acute disease, chest x-ray showed chronic stable findings.    Assessment / Plan / Recommendation Clinical Impression  Pt appears to present w/ adequate oropharyngeal phase swallow w/ No oropharyngeal phase dysphagia noted,No neuromuscular deficits noted.Pt consumed po trials w/ No overt, clinical s/s of aspiration during po trials. Pt appears at reduced risk for aspiration following general aspiration precautions. During po trials, pt consumed all consistencies w/ no overt coughing, decline in vocal quality, or change in respiratory presentation during/post trials. Oral phase appeared [facility] w/ timely bolus management, mastication, and control of bolus propulsion for A-P transfer for swallowing. Oral clearing achieved w/ all trial consistencies. OM Exam appeared [facility] w/ no unilateral weakness noted. Speech Clear. Pt fed self w/ setup support. REcommend a Regular consistency diet w/ well-Cut meats, moistened foods; Thin liquids VIA CUP - pt does not use straws at home. Recommend general aspiration precautions, Pills WHOLE in Puree for safer, easier swallowing as pt described Larger pills causing difficulty to swallow. Education given on Pills in Puree; food consistencies and easy to eat options; general aspiration precautions. NSG to reconsult if any new needs arise. NSG agreed.  SLP Visit Diagnosis: Dysphagia, unspecified (R13.10)    Aspiration Risk  (reduced following precautions)    Diet Recommendation  Regular diet w/  well-Cut meats, foods moistened; Thin liquids. General aspiration precautions; tray Setup at meals for support  Medication Administration: Whole meds with puree(for easier, safer swallowing)    Other  Recommendations Recommended Consults: (Dietician f/u if needed) Oral Care Recommendations: Oral  care BID;Oral care before and after PO;Patient independent with oral care(support) Other Recommendations: (n/a)   Follow up Recommendations None      Frequency and Duration (n/a)  (n/a)       Prognosis Prognosis for Safe Diet Advancement: Good Barriers to Reach Goals: (n/a)      Swallow Study   General Date of Onset: 06/08/19 HPI: Pt is a 86 y.o. female with medical history significant for Panic anxiety syndrome, HOH w/ HAs, bronchiectasis and chronic MAC infection on tobramycin nebulizer, hypothyroidism and depression, who was brought into the emergency room after an episode of slurred speech and partial responsiveness. History is taken from the daughter at the bedside, who said her mother was in her usual state of health and was talking to her from the bathroom sink and then her speech became slurred and unintelligible.  She went quickly to check on her and sat sat her down in her rolling walker and at that point her mother slumped forward and was mumbling unintelligible words.  She did not lose consciousness but it appeared to her like she had zoned out and was not hearing what she was saying.  The episode lasted a few minutes and by arrival in the emergency room, she was back to her usual self.  On arrival patient was alert and oriented x3 little recollection of the details of the event.  Head CT showed no acute disease, chest x-ray showed chronic stable findings.  Type of Study: Bedside Swallow Evaluation Previous Swallow Assessment: none reported Diet Prior to this Study: NPO(regular diet at home) Temperature Spikes Noted: No(wbc 8.3) Respiratory Status: Room air History of Recent Intubation: No Behavior/Cognition: Alert;Cooperative;Pleasant mood(min HOH not aided) Oral Cavity Assessment: Within Functional Limits Oral Care Completed by SLP: Yes Oral Cavity - Dentition: Adequate natural dentition Vision: Functional for self-feeding Self-Feeding Abilities: Able to feed self;Needs  assist;Needs set up Patient Positioning: Upright in bed(needed min support) Baseline Vocal Quality: Normal Volitional Cough: Strong Volitional Swallow: Able to elicit    Oral/Motor/Sensory Function Overall Oral Motor/Sensory Function: Within functional limits   Ice Chips Ice chips: Within functional limits Presentation: Spoon(fed; 2 trials)   Thin Liquid Thin Liquid: Within functional limits Presentation: Cup;Self Fed(~4 ozs total) Other Comments: does not use straws at home    Nectar Thick Nectar Thick Liquid: Not tested   Honey Thick Honey Thick Liquid: Not tested   Puree Puree: Within functional limits Presentation: Spoon;Self Fed(10 trials)   Solid     Solid: Within functional limits Presentation: Spoon;Self Fed(6 trials)       Orinda Kenner, MS, CCC-SLP Electa Sterry 06/09/2019,3:17 PM

## 2019-06-09 NOTE — Evaluation (Signed)
Occupational Therapy Evaluation Patient Details Name: MAXIMA SCHMELTZ MRN: [id number] DOB: [birth year] Today's Date: 06/09/2019    History of Present Illness Pt. is an 86 y.o. female who was admitted to [facility] with weakness, and slurred speech. Imaging revealed chronic ischemic Microangiopathy, Atrophy, and chronic small vessel Ischemic changes of the white Matter. No acute Infarct. PMHx includes: Anxiety, R Breast CA, CKD, Peripheral Neuropathy, HTN, MAI, Thyroid Disease.   Clinical Impression   Pt. resides at home with her husband. Pt. was independent with basic ADLs, and light  IADL functioning: including: light meal preparation, and washing her nebulizer in the kitchen. Pt.'s daughters assist with shower transfers, and set-up. Pt. Required assist with providing complete set-up up of meal tray, and opening packets secondary to peripheral neuropathy. Pt. Reports being extremely photosensitive. Pt. has ordered glasses to assist with this, and is waiting their arrival. Pt. could benefit from OT services for ADL training, A/E training, and pt. education about Energy conservation, and work simplification techniques, home modification, and DME. Pt. Plans to return home upon discharge with family to assist pt. as needed. Pt. Could benefit from follow-up [place] services upon discharge.    Follow Up Recommendations  Home health OT    Equipment Recommendations  None recommended by OT    Recommendations for Other Services       Precautions / Restrictions        Mobility Bed Mobility               General bed mobility comments: Deferred  Transfers                 General transfer comment: Deferred    Balance                                           ADL either performed or assessed with clinical judgement   ADL Overall ADL's : Needs assistance/impaired Eating/Feeding: Set up;Minimal assistance Eating/Feeding Details (indicate cue type and reason): Assist  with meal set-up, and opeing items, and packets secondary to peripheral Neuropathy. Grooming: Minimal assistance           Upper Body Dressing : Min guard   Lower Body Dressing: Moderate assistance                       Vision Baseline Vision/History: Wears glasses Wears Glasses: At all times Patient Visual Report: (Photosensitivity. Has ordered glasses with dimming lenses, and is waiting for them to arrive.)       Perception     Praxis      Pertinent Vitals/Pain Pain Assessment: No/denies pain     Hand Dominance Right   Extremity/Trunk Assessment Upper Extremity Assessment Upper Extremity Assessment: Generalized weakness           Communication Communication Communication: No difficulties   Cognition Arousal/Alertness: Awake/alert Behavior During Therapy: WFL for tasks assessed/performed Overall Cognitive Status: Within Functional Limits for tasks assessed                                     General Comments       Exercises     Shoulder Instructions      Home Living Family/patient expects to be discharged to:: Private residence Living Arrangements: Spouse/significant other Available Help at Discharge: Family(4  daughters) Type of Home: House Home Access: Stairs to enter     Home Layout: Two level(With basement)     Bathroom Shower/Tub: Tub/shower unit         Home Equipment: Cane - single point;Walker - 2 wheels;Shower seat          Prior Functioning/Environment Level of Independence: Needs assistance  Gait / Transfers Assistance Needed: Pt. used a cane up until one month ago. Has transitioned to a walker since then. ADL's / Homemaking Assistance Needed: Pt. was able to perform morning care, washed her nebulizer in her kitchen, and performed light meal preparation. Daughters assist pt. in, and out of the shower.            OT Problem List: Decreased strength;Decreased knowledge of use of DME or AE;Decreased  activity tolerance      OT Treatment/Interventions: Self-care/ADL training;Therapeutic exercise;Patient/family education;DME and/or AE instruction    OT Goals(Current goals can be found in the care plan section) Acute Rehab OT Goals Patient Stated Goal: To return home OT Goal Formulation: With patient Potential to Achieve Goals: Good  OT Frequency: Min 2X/week   Barriers to D/C:            Co-evaluation              AM-PAC OT "6 Clicks" Daily Activity     Outcome Measure Help from another person eating meals?: A Little(set-up) Help from another person taking care of personal grooming?: A Little Help from another person toileting, which includes using toliet, bedpan, or urinal?: A Lot Help from another person bathing (including washing, rinsing, drying)?: A Lot Help from another person to put on and taking off regular upper body clothing?: A Little Help from another person to put on and taking off regular lower body clothing?: A Lot 6 Click Score: 15   End of Session    Activity Tolerance: Patient tolerated treatment well Patient left: in bed  OT Visit Diagnosis: Muscle weakness (generalized) (M62.81)                Time: [id number] OT Time Calculation (min): 23 min Charges:  OT General Charges $OT Visit: 1 Visit OT Evaluation $OT Eval Low Complexity: 1 Low  Harrel Carina, MS, OTR/L  Harrel Carina 06/09/2019, 10:09 AM

## 2019-06-09 NOTE — Progress Notes (Signed)
She complains of constipation.  No abdominal pain, vomiting, dizziness, unilateral weakness or numbness in the extremities, confusion, chest pain or shortness of breath.  She feels better today.  Physical exam is unremarkable.  Diagnostic data reviewed.  Patient has had recurrent episodes of hyponatremia in the past.  Sodium and potassium levels are improving.  MiraLAX and senna have been prescribed for constipation.   Continue current treatment.

## 2019-06-09 NOTE — Progress Notes (Signed)
*  PRELIMINARY RESULTS* Echocardiogram 2D Echocardiogram has been performed.  Sherrie Sport 06/09/2019, 2:23 PM

## 2019-06-09 NOTE — Progress Notes (Signed)
Unable to administer Nebulized Tobi, med not available at this time

## 2019-06-09 NOTE — H&P (Addendum)
History and Physical    Christine Vaughan VEL:[id number] DOB: Jun 24, 1931 DOA: 06/08/2019  PCP: Kirk Ruths, MD   Patient coming from: home I have personally briefly reviewed patient's old medical records in [place]  Chief Complaint: slurred speech  HPI: Christine Vaughan is a 86 y.o. female with medical history significant for bronchiectasis and chronic MAC infection on tobramycin nebulizer, hypothyroidism and panic disorder, who was brought into the emergency room after an episode of slurred speech and partial responsiveness. History is taken from the daughter at the bedside, who said her mother was in her usual state of health and was talking to her from the bathroom sink and then her speech became slurred and unintelligible.  She went quickly to check on her and sat sat her down in her rolling walker and at that point her mother slumped forward and was mumbling unintelligible words.  She did not lose consciousness but it appeared to her like she had zoned out and was not hearing what she was saying.  The episode lasted a few minutes and by arrival in the emergency room, she was back to her usual self.  Patient states that she recently had some slight worsening of her chronic cough and she was started on Levaquin and prednisone by her PCP 4 days prior.  Other than that, she had been at her baseline. ED Course: On arrival patient was alert and oriented x3 little recollection of the details of the event.  Vital signs were within normal limits.  Blood work significant for hyponatremia of 126 but otherwise unremarkable.  Head CT showed no acute disease, chest x-ray showed chronic stable findings, EKG was nonacute.  Hospitalist consulted for admission for possible TIA  Review of Systems: As per HPI otherwise 10 point review of systems negative.    Past Medical History:  Diagnosis Date  . Anxiety   . Breast cancer, right ([place]) 2001   Right Lumpectomy, chemo + rad tx's.   .  Chronic kidney disease   . Hereditary and idiopathic peripheral neuropathy 12/27/2015  . Hypertension   . MAI (mycobacterium avium-intracellulare) ([place])   . Thyroid disease     Past Surgical History:  Procedure Laterality Date  . BACK SURGERY    . BREAST BIOPSY Left    surgical bx pt dose not remember  . BREAST EXCISIONAL BIOPSY Right 2001   + chem rad and armidex  . BREAST LUMPECTOMY  2001  . BREAST SURGERY    . CHOLECYSTECTOMY    . ESOPHAGOGASTRODUODENOSCOPY (EGD) WITH PROPOFOL N/A 09/20/2015   Procedure: ESOPHAGOGASTRODUODENOSCOPY (EGD) WITH PROPOFOL;  Surgeon: Lollie Sails, MD;  Location: [facility] ENDOSCOPY;  Service: Endoscopy;  Laterality: N/A;     reports that she has never smoked. She has never used smokeless tobacco. She reports that she does not drink alcohol or use drugs.  Allergies  Allergen Reactions  . Ciprofloxacin Other (See Comments)    aggravates her neuropathy  . Erythromycin Other (See Comments)    Gi distress  . Metronidazole     Other reaction(s): Abdominal Pain  . Sulfur Other (See Comments)    Doesn't remember  . Tizanidine Other (See Comments)    GI upset  . Penicillins Rash    Has patient had a PCN reaction causing immediate rash, facial/tongue/throat swelling, SOB or lightheadedness with hypotension: No Has patient had a PCN reaction causing severe rash involving mucus membranes or skin necrosis: No Has patient had a PCN reaction that required hospitalization  No Has patient had a PCN reaction occurring within the last 10 years: no If all of the above answers are "NO", then may proceed with Cephalosporin use.  . Sulfamethoxazole Rash    all over body    Family History  Problem Relation Age of Onset  . Depression Mother   . Brain cancer Mother   . Breast cancer Paternal Aunt      Prior to Admission medications   Medication Sig Start Date End Date Taking? Authorizing Provider  acetaminophen (TYLENOL) 500 MG tablet Take 250 mg by mouth  daily as needed.     [provider]  albuterol (PROVENTIL) (5 MG/ML) 0.5% nebulizer solution Take 2.5 mg by nebulization every 6 (six) hours as needed for wheezing or shortness of breath.    [provider]  azelastine (ASTELIN) 0.1 % nasal spray Place into the nose 2 (two) times daily. Use in each nostril as directed    [provider]  dicyclomine (BENTYL) 20 MG tablet Take 20 mg by mouth every 6 (six) hours.    [provider]  esomeprazole (NEXIUM) 40 MG capsule TAKE 1 CAPSULE BY MOUTH TWICE DAILY. TAKE 30 MINUTES BEFORE MEALS. 12/24/16   [provider]  gabapentin (NEURONTIN) 100 MG capsule Take 2 capsules (200 mg total) by mouth at bedtime. 08/31/18   Rainey Pines, MD  gabapentin (NEURONTIN) 100 MG capsule 1-2 pills at night 11/09/18   Rainey Pines, MD  gentamicin cream (GARAMYCIN) 0.1 % Apply 1 application topically 2 (two) times daily. 07/28/18   Edrick Kins, DPM  ipratropium (ATROVENT) 0.06 % nasal spray Place 2 sprays into the nose 2 (two) times daily as needed. 03/19/18 03/19/19  [provider]  levothyroxine (SYNTHROID, LEVOTHROID) 50 MCG tablet Take 50 mcg by mouth daily before breakfast.    [provider]  meloxicam (MOBIC) 7.5 MG tablet Take 1 tablet (7.5 mg total) by mouth daily. 05/11/19   Edrick Kins, DPM  metoprolol succinate (TOPROL-XL) 50 MG 24 hr tablet Take 1 tablet by mouth daily. 03/19/18   [provider]  sertraline (ZOLOFT) 25 MG tablet Take 1 tablet (25 mg total) by mouth daily. 11/09/18   Rainey Pines, MD  tamsulosin (FLOMAX) 0.4 MG CAPS capsule TAKE 1 CAPSULE EVERY DAY 12/28/18   Vaillancourt, Samantha, PA-C  tobramycin, PF, (TOBI) 300 MG/5ML nebulizer solution Take 300 mg by nebulization 2 (two) times daily.    [provider]  traMADol (ULTRAM) 50 MG tablet Take 1 tablet (50 mg total) by mouth every 6 (six) hours as needed. 03/12/18   Carrie Mew, MD    Physical Exam: Vitals:    06/08/19 2124 06/08/19 2145 06/08/19 2200 06/08/19 2230  BP: 125/66  117/70 (!) 124/91  Pulse: 86 86 82   Resp: 18 18 (!) 27 (!) 24  Temp: 97.7 F (36.5 C)     TempSrc: Oral     SpO2: 97% 98% 100%   Weight:      Height:         Vitals:   06/08/19 2124 06/08/19 2145 06/08/19 2200 06/08/19 2230  BP: 125/66  117/70 (!) 124/91  Pulse: 86 86 82   Resp: 18 18 (!) 27 (!) 24  Temp: 97.7 F (36.5 C)     TempSrc: Oral     SpO2: 97% 98% 100%   Weight:      Height:        Constitutional: Alert and awake, oriented x3, not in any acute  distress. Eyes: PERLA, EOMI, irises appear normal, anicteric sclera,  ENMT: external ears and nose appear normal, normal hearing             Lips appears normal, oropharynx mucosa, tongue, posterior pharynx appear normal  Neck: neck appears normal, no masses, normal ROM, no thyromegaly, no JVD  CVS: S1-S2 clear, no murmur rubs or gallops,  , no carotid bruits, pedal pulses palpable, No LE edema Respiratory:  clear to auscultation bilaterally, no wheezing, rales or rhonchi. Respiratory effort normal. No accessory muscle use.  Abdomen: soft nontender, nondistended, normal bowel sounds, no hepatosplenomegaly, no hernias Musculoskeletal: : no cyanosis, clubbing , no contractures or atrophy Neuro: Cranial nerves II-XII intact, sensation, reflexes normal, strength Psych: judgement and insight appear normal, appears anxious Skin: no rashes or lesions or ulcers, no induration or nodules   Labs on Admission: I have personally reviewed following labs and imaging studies  CBC: Recent Labs  Lab 06/08/19 2136  WBC 8.3  HGB 10.3*  HCT 31.0*  MCV 97.2  PLT 626   Basic Metabolic Panel: Recent Labs  Lab 06/08/19 2136  NA 126*  K 3.1*  CL 98  CO2 19*  GLUCOSE 118*  BUN 29*  CREATININE 0.94  CALCIUM 7.6*   GFR: Estimated Creatinine Clearance: 37.8 mL/min (by C-G formula based on SCr of 0.94 mg/dL). Liver Function Tests: No results for input(s): AST,  ALT, ALKPHOS, BILITOT, PROT, ALBUMIN in the last 168 hours. No results for input(s): LIPASE, AMYLASE in the last 168 hours. No results for input(s): AMMONIA in the last 168 hours. Coagulation Profile: No results for input(s): INR, PROTIME in the last 168 hours. Cardiac Enzymes: No results for input(s): CKTOTAL, CKMB, CKMBINDEX, TROPONINI in the last 168 hours. BNP (last 3 results) No results for input(s): PROBNP in the last 8760 hours. HbA1C: No results for input(s): HGBA1C in the last 72 hours. CBG: No results for input(s): GLUCAP in the last 168 hours. Lipid Profile: No results for input(s): CHOL, HDL, LDLCALC, TRIG, CHOLHDL, LDLDIRECT in the last 72 hours. Thyroid Function Tests: No results for input(s): TSH, T4TOTAL, FREET4, T3FREE, THYROIDAB in the last 72 hours. Anemia Panel: No results for input(s): VITAMINB12, FOLATE, FERRITIN, TIBC, IRON, RETICCTPCT in the last 72 hours. Urine analysis:    Component Value Date/Time   COLORURINE YELLOW (A) 06/08/2019 2202   APPEARANCEUR CLEAR (A) 06/08/2019 2202   APPEARANCEUR Hazy (A) 11/17/2018 1511   LABSPEC 1.006 06/08/2019 2202   PHURINE 5.0 06/08/2019 2202   GLUCOSEU NEGATIVE 06/08/2019 2202   HGBUR NEGATIVE 06/08/2019 2202   BILIRUBINUR NEGATIVE 06/08/2019 2202   BILIRUBINUR Negative 11/17/2018 1511   [place] 06/08/2019 2202   PROTEINUR NEGATIVE 06/08/2019 2202   NITRITE NEGATIVE 06/08/2019 2202   LEUKOCYTESUR NEGATIVE 06/08/2019 2202    Radiological Exams on Admission: CT Head Wo Contrast  Result Date: 06/08/2019 CLINICAL DATA:  Near syncopal episode EXAM: CT HEAD WITHOUT CONTRAST TECHNIQUE: Contiguous axial images were obtained from the base of the skull through the vertex without intravenous contrast. COMPARISON:  CT brain 09/13/2015 FINDINGS: Brain: No acute territorial infarction, hemorrhage or intracranial mass. Atrophy. Moderate hypodensity in the white matter consistent with chronic small vessel ischemic  change. The ventricles are nonenlarged Vascular: No hyperdense vessels. Scattered calcifications at the carotid siphons Skull: Normal. Negative for fracture or focal lesion. Sinuses/Orbits: No acute finding. Other: None IMPRESSION: 1. No CT evidence for acute intracranial abnormality. 2. Atrophy and chronic small vessel ischemic change of the white matter Electronically Signed  By: Donavan Foil M.D.   On: 06/08/2019 23:25   DG Chest Portable 1 View  Result Date: 06/08/2019 CLINICAL DATA:  Syncopal episode. EXAM: PORTABLE CHEST 1 VIEW COMPARISON:  March 12, 2018 FINDINGS: Mild chronic appearing increased lung markings are seen bilaterally, without evidence of acute infiltrate, pleural effusion or pneumothorax. Radiopaque surgical clips are seen overlying the lateral aspect of the mid to upper right hemithorax and bilateral breast soft tissues. The heart size and mediastinal contours are within normal limits. Degenerative changes seen throughout the thoracic spine. IMPRESSION: Chronic appearing increased lung markings without evidence of acute or active cardiopulmonary disease. Electronically Signed   By: Virgina Norfolk M.D.   On: 06/08/2019 22:47    EKG: Independently reviewed.   Assessment/Plan Principal Problem:   TIA (transient ischemic attack)    Presyncope --Patient presented with a brief presyncopal episode with slurring of the speech and altered mental status, since resolved -Head CT negative -Follow-up, carotid Doppler, MRI, echocardiogram -Continuous cardiac monitoring for arrhythmias  Hyponatremia -Sodium 126.  Follow serum and urine osmolality and urine sodium -Uncertain etiology, appears euvolemic, possibly slightly hypovolemic -Monitor and correct as appropriate -Mild IV hydration with normal saline pending further work-up    Hypertension -Continue metoprolol pending med rec    Panic anxiety syndrome -Patient was recently placed on Xanax twice daily by her  PCP -Continue Xanax pending med rec    Hypothyroid -Continue levothyroxine --F/U TSH    Bronchiectasis ([place])   MAI (mycobacterium avium-intracellulare) infection ([place]) -Patient was started on Levaquin and prednisone 3 days prior for persistent cough -Continue albuterol inhaler, azelastine, and tobramycin nebulizer pending med rec    DVT prophylaxis: Lovenox  Code Status: full code  Family Communication:  Daughter, Raul Del  Disposition Plan: Back to previous home environment Consults called: none  Status:obs    Athena Masse MD [facility]     06/09/2019, 12:43 AM

## 2019-06-09 NOTE — ED Notes (Signed)
Report off to chris rn.  Pt moved to room 35 cpod.

## 2019-06-09 NOTE — Plan of Care (Signed)
  Problem: Self-Care: Goal: Ability to participate in self-care as condition permits will improve Outcome: Progressing   Problem: Clinical Measurements: Goal: Will remain free from infection Outcome: Progressing   Problem: Ischemic Stroke/TIA Tissue Perfusion: Goal: Complications of ischemic stroke/TIA will be minimized Outcome: Progressing

## 2019-06-09 NOTE — Progress Notes (Signed)
PT Cancellation Note  Patient Details Name: MILLANA STAHL MRN: [id number] DOB: 12-29-1931   Cancelled Treatment:    Reason Eval/Treat Not Completed: Other (comment).  PT consult received.  Chart reviewed.  Upon PT arrival to pt's room, staff present performing Echocardiogram (pt not available for PT evaluation).  Will re-attempt PT evaluation at a later date/time as able.  Leitha Bleak, PT 06/09/19, 1:53 PM

## 2019-06-09 NOTE — ED Notes (Signed)
Pt to MRI

## 2019-06-09 NOTE — ED Notes (Signed)
Pt resting in bed, lights off, blankets on pt. purwick in place, pt denies pain at this time. Call light in reach

## 2019-06-09 NOTE — ED Notes (Addendum)
Pt placed on bedpan at this time, pt put out 150cc. MRI Tech is here to take pt down. When Pt is back in room Purewick will be placed for pt comfort.

## 2019-06-09 NOTE — ED Notes (Signed)
Pt being transported to rm 239 at this time via stretcher.

## 2019-06-09 NOTE — ED Notes (Signed)
MRI states they will take pt in approx 29min

## 2019-06-10 DIAGNOSIS — Z79891 Long term (current) use of opiate analgesic: Secondary | ICD-10-CM | POA: Diagnosis not present

## 2019-06-10 DIAGNOSIS — Z79899 Other long term (current) drug therapy: Secondary | ICD-10-CM | POA: Diagnosis not present

## 2019-06-10 DIAGNOSIS — N189 Chronic kidney disease, unspecified: Secondary | ICD-10-CM | POA: Diagnosis present

## 2019-06-10 DIAGNOSIS — E876 Hypokalemia: Secondary | ICD-10-CM | POA: Diagnosis present

## 2019-06-10 DIAGNOSIS — E871 Hypo-osmolality and hyponatremia: Secondary | ICD-10-CM | POA: Diagnosis present

## 2019-06-10 DIAGNOSIS — I129 Hypertensive chronic kidney disease with stage 1 through stage 4 chronic kidney disease, or unspecified chronic kidney disease: Secondary | ICD-10-CM | POA: Diagnosis present

## 2019-06-10 DIAGNOSIS — Z853 Personal history of malignant neoplasm of breast: Secondary | ICD-10-CM | POA: Diagnosis not present

## 2019-06-10 DIAGNOSIS — G459 Transient cerebral ischemic attack, unspecified: Secondary | ICD-10-CM | POA: Diagnosis present

## 2019-06-10 DIAGNOSIS — I951 Orthostatic hypotension: Secondary | ICD-10-CM | POA: Diagnosis present

## 2019-06-10 DIAGNOSIS — F410 Panic disorder [episodic paroxysmal anxiety] without agoraphobia: Secondary | ICD-10-CM | POA: Diagnosis present

## 2019-06-10 DIAGNOSIS — Z818 Family history of other mental and behavioral disorders: Secondary | ICD-10-CM | POA: Diagnosis not present

## 2019-06-10 DIAGNOSIS — Z20822 Contact with and (suspected) exposure to covid-19: Secondary | ICD-10-CM | POA: Diagnosis present

## 2019-06-10 DIAGNOSIS — Z803 Family history of malignant neoplasm of breast: Secondary | ICD-10-CM | POA: Diagnosis not present

## 2019-06-10 DIAGNOSIS — Z808 Family history of malignant neoplasm of other organs or systems: Secondary | ICD-10-CM | POA: Diagnosis not present

## 2019-06-10 DIAGNOSIS — J479 Bronchiectasis, uncomplicated: Secondary | ICD-10-CM | POA: Diagnosis present

## 2019-06-10 DIAGNOSIS — E1122 Type 2 diabetes mellitus with diabetic chronic kidney disease: Secondary | ICD-10-CM | POA: Diagnosis present

## 2019-06-10 DIAGNOSIS — Z79890 Hormone replacement therapy (postmenopausal): Secondary | ICD-10-CM | POA: Diagnosis not present

## 2019-06-10 DIAGNOSIS — R4781 Slurred speech: Secondary | ICD-10-CM | POA: Diagnosis present

## 2019-06-10 DIAGNOSIS — E039 Hypothyroidism, unspecified: Secondary | ICD-10-CM | POA: Diagnosis present

## 2019-06-10 DIAGNOSIS — A310 Pulmonary mycobacterial infection: Secondary | ICD-10-CM | POA: Diagnosis present

## 2019-06-10 LAB — BASIC METABOLIC PANEL
Anion gap: 6 (ref 5–15)
BUN: 23 mg/dL (ref 8–23)
CO2: 21 mmol/L — ABNORMAL LOW (ref 22–32)
Calcium: 8.4 mg/dL — ABNORMAL LOW (ref 8.9–10.3)
Chloride: 105 mmol/L (ref 98–111)
Creatinine, Ser: 0.98 mg/dL (ref 0.44–1.00)
GFR calc Af Amer: >60 mL/min (ref >60)
GFR calc non Af Amer: 53 mL/min — ABNORMAL LOW (ref >60)
Glucose, Bld: 104 mg/dL — ABNORMAL HIGH (ref 70–99)
Potassium: 4.4 mmol/L (ref 3.5–5.1)
Sodium: 132 mmol/L — ABNORMAL LOW (ref 135–145)

## 2019-06-10 MED ORDER — ASPIRIN EC 81 MG PO TBEC: 81.0000 mg | DELAYED_RELEASE_TABLET | Freq: Every day | ORAL | Status: AC

## 2019-06-10 MED ORDER — ATORVASTATIN CALCIUM 20 MG PO TABS
40.0000 mg | ORAL_TABLET | Freq: Every day | ORAL | Status: DC
  Administered 2019-06-10: 22:00:00 40 mg via ORAL
  Filled 2019-06-10: qty 2

## 2019-06-10 MED ORDER — SODIUM CHLORIDE 0.9 % IV SOLN: INTRAVENOUS | Status: DC

## 2019-06-10 NOTE — Progress Notes (Addendum)
Progress Note    Christine Christine  [id number] DOB: 04-21-1931  DOA: 06/08/2019 PCP: Christine Ruths, MD      Brief Narrative:    Medical records reviewed and are as summarized below:      Assessment/Plan:   Principal Problem:   TIA (transient ischemic attack) Active Problems:   Hypertension   Panic anxiety syndrome   Hypothyroid   Bronchiectasis ([place])   MAI (mycobacterium avium-intracellulare) infection ([place])   Hyponatremia   Hypokalemia   Orthostatic hypotension   Probable TIA (transient ischemic attack) Continue aspirin and Lipitor -Head CT negative -No acute stroke on MRI brain.  Low hemodynamic significant stenosis on carotid duplex.  2D echo showed EF estimated at 55 to 60%, moderate LVH and grade 1 diastolic dysfunction.  Orthostatic hypotension and dizziness BP sitting was 91/51 and BP standing was 62/44.  Continue IV fluids.  Hold metoprolol. Saline is also on hold.  Acute on chronic hyponatremia -Improved.  Sodium 126 on admission.     Hypertension -Hold antihypertensives    Panic anxiety syndrome -Patient was recently placed on Xanax twice daily by her PCP    Hypothyroid -Continue levothyroxine    Bronchiectasis ([place])   MAI (mycobacterium avium-intracellulare) infection ([place]) -Patient was started on Levaquin and prednisone 3 days prior to admission for persistent cough -Continue albuterol inhaler, azelastine, and tobramycin nebulizer pending med rec      Body mass index is 22.02 kg/m.   Family Communication/Anticipated D/C date and plan/Code Status   DVT prophylaxis: Lovenox Code Status: Full code Family Communication: Plan discussed with her daughter, Christine Christine Disposition Plan:    Status is: Observation  The patient will require care spanning > 2 midnights and should be moved to inpatient because: Orthostatic hypotension and dizziness  Dispo: The patient is from: Home              Anticipated d/c is to: Home               Anticipated d/c date is: 1 day              Patient currently is not medically stable to d/c.           Subjective:   C/o dizziness and feels unsteady on her feet  Objective:    Vitals:   06/10/19 0443 06/10/19 0744 06/10/19 1129 06/10/19 1148  BP: (!) 128/92 122/60 (!) 105/54 (!) 122/52  Pulse: 65 66 67 80  Resp: 19 16 18 16   Temp: 98.3 F (36.8 C) 98 F (36.7 C) 97.6 F (36.4 C) 97.6 F (36.4 C)  TempSrc: Oral Oral Oral   SpO2: 97% 99% 99% 100%  Weight: 58.2 kg     Height:        Intake/Output Summary (Last 24 hours) at 06/10/2019 1448 Last data filed at 06/10/2019 1345 Gross per 24 hour  Intake 1671.58 ml  Output 650 ml  Net 1021.58 ml   Filed Weights   06/08/19 2122 06/09/19 0854 06/10/19 0443  Weight: 57.2 kg 60.6 kg 58.2 kg    Exam:  GEN: NAD SKIN: No rash EYES: EOMI ENT: MMM CV: RRR PULM: CTA B ABD: soft, ND, NT, +BS CNS: AAO x 3, non focal EXT: No edema or tenderness   Data Reviewed:   I have personally reviewed following labs and imaging studies:  Labs: Labs show the following:   Basic Metabolic Panel: Recent Labs  Lab 06/08/19 2136 06/08/19 2136 06/09/19 0435 06/10/19 0504  NA 126*  --  132* 132*  K 3.1*   < > 3.7 4.4  CL 98  --  99 105  CO2 19*  --  25 21*  GLUCOSE 118*  --  93 104*  BUN 29*  --  27* 23  CREATININE 0.94  --  0.96 0.98  CALCIUM 7.6*  --  8.5* 8.4*  MG  --   --  2.1  --   PHOS  --   --  2.9  --    < > = values in this interval not displayed.   GFR Estimated Creatinine Clearance: 36.2 mL/min (by C-G formula based on SCr of 0.98 mg/dL). Liver Function Tests: No results for input(s): AST, ALT, ALKPHOS, BILITOT, PROT, ALBUMIN in the last 168 hours. No results for input(s): LIPASE, AMYLASE in the last 168 hours. No results for input(s): AMMONIA in the last 168 hours. Coagulation profile No results for input(s): INR, PROTIME in the last 168 hours.  CBC: Recent Labs  Lab 06/08/19 2136  WBC  8.3  HGB 10.3*  HCT 31.0*  MCV 97.2  PLT 253   Cardiac Enzymes: No results for input(s): CKTOTAL, CKMB, CKMBINDEX, TROPONINI in the last 168 hours. BNP (last 3 results) No results for input(s): PROBNP in the last 8760 hours. CBG: No results for input(s): GLUCAP in the last 168 hours. D-Dimer: No results for input(s): DDIMER in the last 72 hours. Hgb A1c: No results for input(s): HGBA1C in the last 72 hours. Lipid Profile: Recent Labs    06/09/19 0435  CHOL 215*  HDL 71  LDLCALC 132*  TRIG 62  CHOLHDL 3.0   Thyroid function studies: No results for input(s): TSH, T4TOTAL, T3FREE, THYROIDAB in the last 72 hours.  Invalid input(s): FREET3 Anemia work up: No results for input(s): VITAMINB12, FOLATE, FERRITIN, TIBC, IRON, RETICCTPCT in the last 72 hours. Sepsis Labs: Recent Labs  Lab 06/08/19 2136  WBC 8.3    Microbiology Recent Results (from the past 240 hour(s))  SARS CORONAVIRUS 2 (TAT 6-24 HRS) Nasopharyngeal Nasopharyngeal Swab     Status: None   Collection Time: 06/09/19 12:22 AM   Specimen: Nasopharyngeal Swab  Result Value Ref Range Status   SARS Coronavirus 2 NEGATIVE NEGATIVE Final    Comment: (NOTE) SARS-CoV-2 target nucleic acids are NOT DETECTED. The SARS-CoV-2 RNA is generally detectable in upper and lower respiratory specimens during the acute phase of infection. Negative results do not preclude SARS-CoV-2 infection, do not rule out co-infections with other pathogens, and should not be used as the sole basis for treatment or other patient management decisions. Negative results must be combined with clinical observations, patient history, and epidemiological information. The expected result is Negative. Fact Sheet for Patients: SugarRoll.be Fact Sheet for Healthcare Providers: https://www.woods-mathews.com/ This test is not yet approved or cleared by the [place] FDA and  has been authorized for  detection and/or diagnosis of SARS-CoV-2 by FDA under an Emergency Use Authorization (EUA). This EUA will remain  in effect (meaning this test can be used) for the duration of the COVID-19 declaration under Section 56 4(b)(1) of the Act, 21 U.S.C. section 360bbb-3(b)(1), unless the authorization is terminated or revoked sooner. Performed at [facility], [place] [address], [place], [place] [address]   Respiratory Panel by RT PCR (Flu A&B, Covid) -     Status: Abnormal   Collection Time: 06/09/19 12:22 AM  Result Value Ref Range Status   SARS Coronavirus 2 by RT PCR TEST WILL  BE CREDITED (A) NEGATIVE Final   Influenza A by PCR TEST WILL BE CREDITED (A) NEGATIVE Final   Influenza B by PCR TEST WILL BE CREDITED (A) NEGATIVE Final    Comment: (NOTE) The Xpert Xpress SARS-CoV-2/FLU/RSV assay is intended as an aid in  the diagnosis of influenza from Nasopharyngeal swab specimens and  should not be used as a sole basis for treatment. Nasal washings and  aspirates are unacceptable for Xpert Xpress SARS-CoV-2/FLU/RSV  testing. Fact Sheet for Patients: PinkCheek.be Fact Sheet for Healthcare Providers: GravelBags.it This test is not yet approved or cleared by the [place] FDA and  has been authorized for detection and/or diagnosis of SARS-CoV-2 by  FDA under an Emergency Use Authorization (EUA). This EUA will remain  in effect (meaning this test can be used) for the duration of the  Covid-19 declaration under Section 564(b)(1) of the Act, 21  U.S.C. section 360bbb-3(b)(1), unless the authorization is  terminated or revoked. Performed at [facility], [place] [address] [place], [place] [address]     Procedures and diagnostic studies:  CT Head Wo Contrast  Result Date: 06/08/2019 CLINICAL DATA:  Near syncopal episode EXAM: CT HEAD WITHOUT CONTRAST TECHNIQUE: Contiguous axial images were obtained from the base of the  skull through the vertex without intravenous contrast. COMPARISON:  CT brain 09/13/2015 FINDINGS: Brain: No acute territorial infarction, hemorrhage or intracranial mass. Atrophy. Moderate hypodensity in the white matter consistent with chronic small vessel ischemic change. The ventricles are nonenlarged Vascular: No hyperdense vessels. Scattered calcifications at the carotid siphons Skull: Normal. Negative for fracture or focal lesion. Sinuses/Orbits: No acute finding. Other: None IMPRESSION: 1. No CT evidence for acute intracranial abnormality. 2. Atrophy and chronic small vessel ischemic change of the white matter Electronically Signed   By: Donavan Foil M.D.   On: 06/08/2019 23:25   MR BRAIN WO CONTRAST  Result Date: 06/09/2019 CLINICAL DATA:  Slurred speech and weakness EXAM: MRI HEAD WITHOUT CONTRAST TECHNIQUE: Multiplanar, multiecho pulse sequences of the brain and surrounding structures were obtained without intravenous contrast. COMPARISON:  None. FINDINGS: BRAIN: No acute infarct, acute hemorrhage or extra-axial collection. Diffuse confluent hyperintense T2-weighted signal within the periventricular, deep and juxtacortical white matter, most commonly due to chronic ischemic microangiopathy. There is generalized atrophy without lobar predilection. Midline structures are normal. VASCULAR: Abnormal flow void of the left internal jugular vein and left transverse sinus. Susceptibility-sensitive sequences show no chronic microhemorrhage or superficial siderosis. SKULL AND UPPER CERVICAL SPINE: Normal calvarium and skull base. Visualized upper cervical spine and soft tissues are normal. SINUSES/ORBITS: No paranasal sinus fluid levels or advanced mucosal thickening. No mastoid or middle ear effusion. Normal orbits. IMPRESSION: 1. Abnormal flow void of the left internal jugular vein and left transverse sinus. This may be due to slow flow or occlusion. Consider further evaluation with MRV with contrast or CTV.  2. No acute infarct. 3. Findings of severe chronic ischemic microangiopathy. Electronically Signed   By: Ulyses Jarred M.D.   On: 06/09/2019 03:03   US Carotid Bilateral (at [facility] and [facility] only)  Result Date: 06/09/2019 CLINICAL DATA:  Hypertension, TIA symptoms EXAM: BILATERAL CAROTID DUPLEX ULTRASOUND TECHNIQUE: Pearline Cables scale imaging, color Doppler and duplex ultrasound were performed of bilateral carotid and vertebral arteries in the neck. COMPARISON:  None. FINDINGS: Criteria: Quantification of carotid stenosis is based on velocity parameters that correlate the residual internal carotid diameter with NASCET-based stenosis levels, using the diameter of the distal internal carotid lumen as the denominator  for stenosis measurement. The following velocity measurements were obtained: RIGHT ICA: 111/28 cm/sec CCA: 0000000 cm/sec SYSTOLIC ICA/CCA RATIO:  2.7 ECA: 47 cm/sec LEFT ICA: 67/22 cm/sec CCA: [id number] cm/sec SYSTOLIC ICA/CCA RATIO:  1.4 ECA: 53 cm/sec RIGHT CAROTID ARTERY: Minor echogenic shadowing plaque formation. No hemodynamically significant right ICA stenosis, velocity elevation, or turbulent flow. Degree of narrowing less than 50%. RIGHT VERTEBRAL ARTERY:  Antegrade LEFT CAROTID ARTERY: Similar scattered minor echogenic plaque formation. No hemodynamically significant left ICA stenosis, velocity elevation, or turbulent flow. LEFT VERTEBRAL ARTERY:  Antegrade IMPRESSION: Minor carotid atherosclerosis. No hemodynamically significant ICA stenosis. Degree of narrowing less than 50% bilaterally by ultrasound criteria. Patent antegrade vertebral flow bilaterally Electronically Signed   By: Jerilynn Mages.  Shick M.D.   On: 06/09/2019 12:37   DG Chest Portable 1 View  Result Date: 06/08/2019 CLINICAL DATA:  Syncopal episode. EXAM: PORTABLE CHEST 1 VIEW COMPARISON:  March 12, 2018 FINDINGS: Mild chronic appearing increased lung markings are seen bilaterally, without evidence of acute infiltrate, pleural effusion or  pneumothorax. Radiopaque surgical clips are seen overlying the lateral aspect of the mid to upper right hemithorax and bilateral breast soft tissues. The heart size and mediastinal contours are within normal limits. Degenerative changes seen throughout the thoracic spine. IMPRESSION: Chronic appearing increased lung markings without evidence of acute or active cardiopulmonary disease. Electronically Signed   By: Virgina Norfolk M.D.   On: 06/08/2019 22:47   ECHOCARDIOGRAM COMPLETE  Result Date: 06/09/2019    ECHOCARDIOGRAM REPORT   Patient Name:   INASIA BRAULT Date of Exam: 06/09/2019 Medical Rec #:  [id number]          Height:       64.0 in Accession #:    [id number]         Weight:       133.7 lb Date of Birth:  August 21, 1931          BSA:          1.649 m Patient Age:    50 years           BP:           155/103 mmHg Patient Gender: F                  HR:           95 bpm. Exam Location:  [facility] Procedure: 2D Echo, Cardiac Doppler, Color Doppler and Strain Analysis Indications:     TIA 435.9  History:         Patient has no prior history of Echocardiogram examinations.                  Stroke; Risk Factors:Diabetes and Hypertension.  Sonographer:     Sherrie Sport RDCS (AE) Referring Phys:  [id number] Athena Masse Diagnosing Phys: Bartholome Bill MD IMPRESSIONS  1. Left ventricular ejection fraction, by estimation, is 55 to 60%. The left ventricle has normal function. The left ventricle has no regional wall motion abnormalities. There is moderate left ventricular hypertrophy. Left ventricular diastolic parameters are consistent with Grade I diastolic dysfunction (impaired relaxation).  2. Right ventricular systolic function is normal. The right ventricular size is normal. There is normal pulmonary artery systolic pressure.  3. The mitral valve is grossly normal. Trivial mitral valve regurgitation.  4. The aortic valve is grossly normal. Aortic valve regurgitation is not visualized. FINDINGS  Left Ventricle: Left  ventricular ejection fraction, by estimation, is 55 to 60%. The left ventricle  has normal function. The left ventricle has no regional wall motion abnormalities. The left ventricular internal cavity size was normal in size. There is  moderate left ventricular hypertrophy. Left ventricular diastolic parameters are consistent with Grade I diastolic dysfunction (impaired relaxation). Right Ventricle: The right ventricular size is normal. No increase in right ventricular wall thickness. Right ventricular systolic function is normal. There is normal pulmonary artery systolic pressure. The tricuspid regurgitant velocity is 1.78 m/s, and  with an assumed right atrial pressure of 10 mmHg, the estimated right ventricular systolic pressure is [id number] mmHg. Left Atrium: Left atrial size was normal in size. Right Atrium: Right atrial size was normal in size. Pericardium: There is no evidence of pericardial effusion. Mitral Valve: The mitral valve is grossly normal. Trivial mitral valve regurgitation. Tricuspid Valve: The tricuspid valve is not well visualized. Tricuspid valve regurgitation is trivial. Aortic Valve: The aortic valve is grossly normal. Aortic valve regurgitation is not visualized. Aortic valve mean gradient measures 2.5 mmHg. Aortic valve peak gradient measures 4.3 mmHg. Aortic valve area, by VTI measures 2.99 cm. Pulmonic Valve: The pulmonic valve was not well visualized. Pulmonic valve regurgitation is not visualized. Aorta: The aortic root is normal in size and structure. IAS/Shunts: The interatrial septum was not assessed.  LEFT VENTRICLE PLAX 2D LVIDd:         3.93 cm  Diastology LVIDs:         2.77 cm  LV e' lateral:   5.55 cm/s LV PW:         1.03 cm  LV E/e' lateral: 11.0 LV IVS:        0.85 cm  LV e' medial:    5.11 cm/s LVOT diam:     2.00 cm  LV E/e' medial:  12.0 LV SV:         56 LV SV Index:   34 LVOT Area:     3.14 cm                          3D Volume EF:                         3D EF:        64 %                          LV EDV:       75 ml                         LV ESV:       27 ml                         LV SV:        49 ml RIGHT VENTRICLE RV Basal diam:  3.77 cm RV S prime:     13.50 cm/s LEFT ATRIUM           Index      RIGHT ATRIUM          Index LA diam:      2.50 cm 1.52 cm/m RA Area:     9.75 cm LA Vol (A4C): 16.4 ml 9.95 ml/m RA Volume:   16.10 ml 9.77 ml/m  AORTIC VALVE                   PULMONIC  VALVE AV Area (Vmax):    3.02 cm    PV Vmax:        0.75 m/s AV Area (Vmean):   2.97 cm    PV Peak grad:   2.3 mmHg AV Area (VTI):     2.99 cm    RVOT Peak grad: 4 mmHg AV Vmax:           104.05 cm/s AV Vmean:          76.250 cm/s AV VTI:            0.188 m AV Peak Grad:      4.3 mmHg AV Mean Grad:      2.5 mmHg LVOT Vmax:         99.90 cm/s LVOT Vmean:        72.100 cm/s LVOT VTI:          0.179 m LVOT/AV VTI ratio: 0.95  AORTA Ao Root diam: 2.70 cm MITRAL VALVE               TRICUSPID VALVE MV Area (PHT): 2.48 cm    TR Peak grad:   12.7 mmHg MV Decel Time: 306 msec    TR Vmax:        178.00 cm/s MV E velocity: 61.30 cm/s MV A velocity: 80.60 cm/s  SHUNTS MV E/A ratio:  0.76        Systemic VTI:  0.18 m                            Systemic Diam: 2.00 cm Bartholome Bill MD Electronically signed by Bartholome Bill MD Signature Date/Time: 06/09/2019/4:40:49 PM    Final     Medications:   .  stroke: mapping our early stages of recovery book   Does not apply Once  . aspirin  300 mg Rectal Daily   Or  . aspirin EC  325 mg Oral Daily  . atorvastatin  40 mg Oral QHS  . azelastine  1 spray Each Nare BID  . dicyclomine  20 mg Oral Q6H  . enoxaparin (LOVENOX) injection  40 mg Subcutaneous Q24H  . gabapentin  200 mg Oral QHS  . levothyroxine  50 mcg Oral QAC breakfast  . pantoprazole  40 mg Oral Daily  . polyethylene glycol  17 g Oral Daily  . sertraline  25 mg Oral Daily  . tamsulosin  0.4 mg Oral Daily  . tobramycin (PF)  300 mg Nebulization BID   Continuous Infusions: . 0.9 % NaCl with KCl 20  mEq / L 75 mL/hr at 06/10/19 0840     LOS: 0 days   Lazarius Rivkin  [facility]     06/10/2019, 2:48 PM

## 2019-06-10 NOTE — Progress Notes (Signed)
SLP Cancellation Note  Patient Details Name: Christine Vaughan MRN: [id number] DOB: 23-Oct-1931   Cancelled treatment:       Reason Eval/Treat Not Completed: SLP screened, no needs identified, will sign off(chart reviewed; consulted NSG then met w/ pt ). BSE completed. No overt dysphagia or difficulty swallowing at evaluation. Pt is on a mech soft/regular diet; tolerates swallowing pills w/ Puree for easier swallowing of larger pills. Pt conversed at conversational level w/out deficits noted; No apparent speech-language deficits noted. Pt and NSG denied any speech-language deficits.  No further skilled ST services indicated as pt appears at her baseline. Pt agreed. NSG to reconsult if any change in status.     Orinda Kenner, MS, CCC-SLP Christine Vaughan 06/10/2019, 10:52 AM

## 2019-06-10 NOTE — Evaluation (Signed)
Physical Therapy Evaluation Patient Details Name: Christine Vaughan MRN: [id number] DOB: 1931/06/10 Today's Date: 06/10/2019   History of Present Illness  Pt is an 86 y.o. female presenting to hospital 4/13 with slurred speech and weakness.  Pt admitted with TIA, presyncope, hyponatremia, htn, panic anxiety syndrome, bronchiectasis.  MRI of brain imaging negative for acute infarct.  PMH includes anxiety, h/o R breast CA s/p lumpectomy, CKD, peripheral neuropathy, htn, back surgery, h/o PE and DVT, panic disorder.  Clinical Impression  Prior to hospital admission, pt was modified independent ambulating with rollator (had assist with stairs navigation); lives with her husband on main level of home; has very supportive daughters who assist.  Currently pt is modified independent semi-supine to sitting edge of bed; CGA to SBA with transfers using RW; CGA with ambulating 60 feet with RW; and 1 assist with stairs navigation with UE support.  Pt appearing anxious at times during session.  Generalized weakness noted.  Pt reporting lightheadedness and weakness while performing steps (and pt then reporting she was also lightheaded when ambulating earlier) so therapist checked pt's BP.  Pt's BP 91/51 sitting in recliner; BP 62/44 standing (symptomatic); and BP 102/56 after sitting resting in chair (nursing notified and therapist sent MD secure message regarding pt's BP and symptoms).  Pt would benefit from skilled PT to address noted impairments and functional limitations (see below for any additional details).  Upon hospital discharge, pt would benefit from HHPT and supervision with mobility for safety (and recommended 2 assist for stairs for safety initially--discussed with pt and pt's daughter).    Follow Up Recommendations Home health PT;Supervision for mobility/OOB;Other (comment)(assist for stairs)    Equipment Recommendations  (pt has needed DME at home already)    Recommendations for Other Services        Precautions / Restrictions Precautions Precautions: Fall Precaution Comments: aspiration      Mobility  Bed Mobility Overal bed mobility: Modified Independent             General bed mobility comments: Semi-supine to sit without any noted difficulties; HOB elevated  Transfers Overall transfer level: Needs assistance Equipment used: Rolling walker (2 wheeled) Transfers: Sit to/from [facility] Sit to Stand: Min guard;Supervision Stand pivot transfers: Min guard(stand step turn bed to recliner with RW; steady)       General transfer comment: x2 trials standing from bed; x4 trials standing from recliner; fairly strong stand with use of RW  Ambulation/Gait Ambulation/Gait assistance: Min guard Gait Distance (Feet): 60 Feet Assistive device: Rolling walker (2 wheeled) Gait Pattern/deviations: Step-through pattern Gait velocity: decreased   General Gait Details: steady with RW use  Stairs Stairs: Yes Stairs assistance: Min assist Stair Management: One rail Right(hand hold assist on left) Number of Stairs: (2 total steps) General stair comments: ascended/descended 1 step with L hand hold assist and R UE support on elevated bed rail (min assist d/t posterior lean ascending initially); 2nd trial ascending/descending 1 step pt appearing stronger (same UE support provided as 1st trial) with CGA for safety  Wheelchair Mobility    Modified Rankin (Stroke Patients Only)       Balance Overall balance assessment: Needs assistance Sitting-balance support: No upper extremity supported;Feet supported Sitting balance-Leahy Scale: Normal Sitting balance - Comments: steady sitting reaching outside BOS   Standing balance support: During functional activity;Bilateral upper extremity supported Standing balance-Leahy Scale: Good Standing balance comment: steady ambulating with RW  Pertinent Vitals/Pain Pain Assessment:  No/denies pain     Home Living Family/patient expects to be discharged to:: Private residence Living Arrangements: Spouse/significant other(Husband) Available Help at Discharge: Family Type of Home: House Home Access: Stairs to enter Entrance Stairs-Rails: None Entrance Stairs-Number of Steps: 1 plus 2 steps to enter (uses cane and hand held assist x1 for stairs navigation) Home Layout: One level(with basement) Home Equipment: Cane - single point;Walker - 4 wheels;Shower seat      Prior Function Level of Independence: Needs assistance   Gait / Transfers Assistance Needed: Pt most recently ambulating with rollator (used cane prior to that)  ADL's / Homemaking Assistance Needed: Daughter's assist pt with shower transfers and set-up  Comments: Pt photosensitive     Hand Dominance   Dominant Hand: Right    Extremity/Trunk Assessment   Upper Extremity Assessment Upper Extremity Assessment: Generalized weakness    Lower Extremity Assessment Lower Extremity Assessment: Generalized weakness    Cervical / Trunk Assessment Cervical / Trunk Assessment: Normal  Communication   Communication: No difficulties  Cognition Arousal/Alertness: Awake/alert Behavior During Therapy: Anxious Overall Cognitive Status: Within Functional Limits for tasks assessed                                        General Comments   Nursing cleared pt for participation in physical therapy.  Pt agreeable to PT session.  Pt's daughter present part of session.    Exercises  transfers; ambulation; stairs   Assessment/Plan    PT Assessment Patient needs continued PT services  PT Problem List Decreased strength;Decreased activity tolerance;Decreased balance;Decreased mobility;Cardiopulmonary status limiting activity       PT Treatment Interventions DME instruction;Gait training;Stair training;Functional mobility training;Therapeutic activities;Therapeutic exercise;Balance  training;Patient/family education    PT Goals (Current goals can be found in the Care Plan section)  Acute Rehab PT Goals Patient Stated Goal: to go home PT Goal Formulation: With patient Time For Goal Achievement: 06/24/19 Potential to Achieve Goals: Good    Frequency Min 2X/week   Barriers to discharge        Co-evaluation               AM-PAC PT "6 Clicks" Mobility  Outcome Measure Help needed turning from your back to your side while in a flat bed without using bedrails?: None Help needed moving from lying on your back to sitting on the side of a flat bed without using bedrails?: None Help needed moving to and from a bed to a chair (including a wheelchair)?: A Little Help needed standing up from a chair using your arms (e.g., wheelchair or bedside chair)?: A Little Help needed to walk in hospital room?: A Little Help needed climbing 3-5 steps with a railing? : A Little 6 Click Score: 20    End of Session Equipment Utilized During Treatment: Gait belt Activity Tolerance: Treatment limited secondary to medical complications (Comment)(pt reporting lightheadedness and weakness) Patient left: in chair;with call bell/phone within reach;with chair alarm set;with family/visitor present Nurse Communication: Mobility status;Precautions;Other (comment)(Pt's BP during session) PT Visit Diagnosis: Other abnormalities of gait and mobility (R26.89);Muscle weakness (generalized) (M62.81);Difficulty in walking, not elsewhere classified (R26.2)    Time: [id number] PT Time Calculation (min) (ACUTE ONLY): 72 min   Charges:   PT Evaluation $PT Eval Low Complexity: 1 Low PT Treatments $Gait Training: 8-22 mins $Therapeutic Exercise: 8-22 mins $Therapeutic Activity: 8-22 mins  Leitha Bleak, PT 06/10/19, 1:38 PM

## 2019-06-11 MED ORDER — ATORVASTATIN CALCIUM 40 MG PO TABS: 40.0000 mg | ORAL_TABLET | Freq: Every day | ORAL | 0 refills | Status: DC

## 2019-06-11 NOTE — TOC Initial Note (Signed)
Transition of Care [facility]) - Initial/Assessment Note    Patient Details  Name: Christine Vaughan MRN: [id number] Date of Birth: [birth year]  Transition of [facility]) CM/SW Contact:    Eileen Stanford, LCSW Phone Number: 06/11/2019 1400 Clinical Narrative:     Pt's daughter present at bedside. Pt and pt's daughter are agreeable to [facility]-- with no preference to which agency. Pt and pt's daughter agreeable to CSW searching for [facility] agency that will take pt's insurance.   [facility] will service pt.               Expected Discharge Plan: [place] Barriers to Discharge: Continued Medical Work up   Patient Goals and CMS Choice Patient states their goals for this hospitalization and ongoing recovery are:: to get btter      Expected Discharge Plan and Services Expected Discharge Plan: [place] In-house Referral: Clinical Social Work   Post Acute Care Choice: [place] arrangements for the past 2 months: [place] Expected Discharge Date: 06/11/19                         HH Arranged: PT, OT [place] Agency: [place] (now [facility]) Date [place]: 06/10/19 Time HH Agency Contacted: 1400 Representative spoke with at [place]: Helene Kelp  Prior Living Arrangements/Services Living arrangements for the past 2 months: [place] with:: Self Patient language and need for interpreter reviewed:: Yes Do you feel safe going back to the place where you live?: Yes      Need for Family Participation in Patient Care: Yes (Comment) Care giver support system in place?: Yes (comment)   Criminal Activity/Legal Involvement Pertinent to Current Situation/Hospitalization: No - Comment as needed  Activities of Daily Living Home Assistive Devices/Equipment: Walker (specify type), Oxygen, Nebulizer, Eyeglasses, Shower chair without back, Grab bars around toilet ADL Screening (condition at time of admission) Patient's  cognitive ability adequate to safely complete daily activities?: Yes Is the patient deaf or have difficulty hearing?: Yes Does the patient have difficulty seeing, even when wearing glasses/contacts?: Yes Does the patient have difficulty concentrating, remembering, or making decisions?: No Patient able to express need for assistance with ADLs?: Yes Does the patient have difficulty dressing or bathing?: No Independently performs ADLs?: Yes (appropriate for developmental age) Does the patient have difficulty walking or climbing stairs?: Yes(uses walker) Weakness of Legs: Both Weakness of Arms/Hands: None  Permission Sought/Granted Permission sought to share information with : Family Supports    Share Information with NAME: [name]  Permission granted to share info w AGENCY: [facility]  Permission granted to share info w Relationship: daughter     Emotional Assessment Appearance:: Appears stated age     Orientation: : Oriented to Self, Oriented to Place, Oriented to  Time Alcohol / Substance Use: Not Applicable Psych Involvement: No (comment)  Admission diagnosis:  TIA (transient ischemic attack) [G45.9] Hyponatremia [E87.1] Weakness [R53.1] Orthostatic hypotension [I95.1] Patient Active Problem List   Diagnosis Date Noted  . Orthostatic hypotension 06/10/2019  . TIA (transient ischemic attack) 06/09/2019  . Hyponatremia 06/09/2019  . Hypokalemia 06/09/2019  . Chronic deep vein thrombosis (DVT) of right lower extremity ([place]) 03/25/2018  . Pulmonary embolus ([place]) 03/25/2018  . Use of cane as ambulatory aid 01/15/2018  . MAI (mycobacterium avium-intracellulare) infection ([place]) 06/16/2017  . Headache syndrome 12/27/2015  . Memory change 12/27/2015  . Vitamin B12 deficiency 12/27/2015  .  Hereditary and idiopathic peripheral neuropathy 12/27/2015  . Abnormality of gait 12/27/2015  . Hypertension   . Health care maintenance 10/12/2014  . Mixed hyperlipidemia 10/07/2013  . Trigeminal  neuralgia 09/19/2013  . Panic anxiety syndrome 09/19/2013  . Osteopenia 09/19/2013  . Hypothyroid 09/19/2013  . 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 Depression, major, in remission ([place]) 09/19/2013  . Bronchiectasis ([place]) 09/19/2013  . History of radiation therapy 07/15/2011  . Breast cancer ([place]) 07/15/2011  . 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 Optic neuropathy 12/31/2010  . Nuclear cataract 12/31/2010  . Glaucoma suspect 12/31/2010   PCP:  Kirk Ruths, MD Pharmacy:   [place], [place] [address] [place] [address] Phone: [phone] Fax: [phone]  [place], [place] [address] Phone: [phone] Fax: [phone]     Social Determinants of Health (SDOH) Interventions    Readmission Risk Interventions No flowsheet data found.

## 2019-06-11 NOTE — Progress Notes (Signed)
[name] to be D/C'd Home per MD order. Patient given discharge teaching and paperwork regarding medications, diet, follow-up appointments and activity. Patient understanding verbalized. No questions or complaints at this time. Skin condition as charted. IV and telemetry removed prior to leaving.  No further needs by Care Management/Social Work. Prescriptions e-prescribed to pharmacy.  An After Visit Summary was printed and given to the patient.   Patient escorted via wheelchair.  Terrilyn Saver

## 2019-06-11 NOTE — Discharge Summary (Signed)
Physician Discharge Summary  KYNSLEI ART FGH:[id number] DOB: [birth year] DOA: 06/08/2019  PCP: Kirk Ruths, MD  Admit date: 06/08/2019 Discharge date: 06/11/2019  Discharge disposition: Home   Recommendations for Outpatient Follow-Up:   Outpatient follow-up with PCP in 1 week   Discharge Diagnosis:   Principal Problem:   TIA (transient ischemic attack) Active Problems:   Hypertension   Panic anxiety syndrome   Hypothyroid   Bronchiectasis ([place])   MAI (mycobacterium avium-intracellulare) infection ([place])   Hyponatremia   Hypokalemia   Orthostatic hypotension    Discharge Condition: Stable.  Diet recommendation: Heart healthy diet  Code status: Full code.    Hospital Course:   bronchiectasis and chronic MAC infection on tobramycin nebulizer, hypothyroidism and panic disorder, who was brought into the emergency room after an episode of slurred speech and partial responsiveness. History is taken from the daughter at the bedside, who said her mother was in her usual state of health and was talking to her from the bathroom sink and then her speech became slurred and unintelligible.  She went quickly to check on her and sat sat her down in her rolling walker and at that point her mother slumped forward and was mumbling unintelligible words.  She did not lose consciousness but it appeared to her like she had zoned out and was not hearing what she was saying.  The episode lasted a few minutes and by arrival in the emergency room, she was back to her usual self.  Patient states that she recently had some slight worsening of her chronic cough and she was started on Levaquin and prednisone by her PCP 4 days prior.  CT head and MRI brain did not show any evidence of acute stroke or abnormality.  There was stroke suspicion that patient had had a TIA.  She said she has baby aspirin at home that she uses sparingly.  She was to take baby aspirin religiously because of strong  suspicion for TIA.  She was also started on low-dose Lipitor.  Patient has chronic hyponatremia but on admission, she had acute hyponatremia.  She also had orthostatic hypotension and dizziness.  She was treated with IV fluids.  Hyponatremia and hypotension have improved.  She was able to ambulate with PT without any problems and home health therapy was recommended.  Patient said she had been prescribed meloxicam about a month ago for plantar fasciitis but she had not taken it.  She has been advised to avoid concomitant use of meloxicam or any NSAID with aspirin.   She is feeling better and she wants to be discharged home today.     Discharge Exam:   Vitals:   06/11/19 0846 06/11/19 0850  BP: 131/78 105/67  Pulse: 64 78  Resp:    Temp:    SpO2: 100% 100%   Vitals:   06/11/19 0510 06/11/19 0844 06/11/19 0846 06/11/19 0850  BP: (!) 138/59 (!) 148/63 131/78 105/67  Pulse: 70 63 64 78  Resp: 19 18    Temp: 97.8 F (36.6 C) 97.8 F (36.6 C)    TempSrc: Oral     SpO2: 99% 100% 100% 100%  Weight: 59.9 kg     Height:         GEN: NAD SKIN: No rash EYES: EOMI ENT: MMM CV: RRR PULM: CTA B ABD: soft, ND, NT, +BS CNS: AAO x 3, non focal EXT: No edema or tenderness 1   The results of significant diagnostics from this hospitalization (including  imaging, microbiology, ancillary and laboratory) are listed below for reference.     Procedures and Diagnostic Studies:   MR BRAIN WO CONTRAST  Result Date: 06/09/2019 CLINICAL DATA:  Slurred speech and weakness EXAM: MRI HEAD WITHOUT CONTRAST TECHNIQUE: Multiplanar, multiecho pulse sequences of the brain and surrounding structures were obtained without intravenous contrast. COMPARISON:  None. FINDINGS: BRAIN: No acute infarct, acute hemorrhage or extra-axial collection. Diffuse confluent hyperintense T2-weighted signal within the periventricular, deep and juxtacortical white matter, most commonly due to chronic ischemic  microangiopathy. There is generalized atrophy without lobar predilection. Midline structures are normal. VASCULAR: Abnormal flow void of the left internal jugular vein and left transverse sinus. Susceptibility-sensitive sequences show no chronic microhemorrhage or superficial siderosis. SKULL AND UPPER CERVICAL SPINE: Normal calvarium and skull base. Visualized upper cervical spine and soft tissues are normal. SINUSES/ORBITS: No paranasal sinus fluid levels or advanced mucosal thickening. No mastoid or middle ear effusion. Normal orbits. IMPRESSION: 1. Abnormal flow void of the left internal jugular vein and left transverse sinus. This may be due to slow flow or occlusion. Consider further evaluation with MRV with contrast or CTV. 2. No acute infarct. 3. Findings of severe chronic ischemic microangiopathy. Electronically Signed   By: Ulyses Jarred M.D.   On: 06/09/2019 03:03   US Carotid Bilateral (at [facility] and [facility] only)  Result Date: 06/09/2019 CLINICAL DATA:  Hypertension, TIA symptoms EXAM: BILATERAL CAROTID DUPLEX ULTRASOUND TECHNIQUE: Pearline Cables scale imaging, color Doppler and duplex ultrasound were performed of bilateral carotid and vertebral arteries in the neck. COMPARISON:  None. FINDINGS: Criteria: Quantification of carotid stenosis is based on velocity parameters that correlate the residual internal carotid diameter with NASCET-based stenosis levels, using the diameter of the distal internal carotid lumen as the denominator for stenosis measurement. The following velocity measurements were obtained: RIGHT ICA: 111/28 cm/sec CCA: 21/1 cm/sec SYSTOLIC ICA/CCA RATIO:  2.7 ECA: 47 cm/sec LEFT ICA: 67/22 cm/sec CCA: 94/1 cm/sec SYSTOLIC ICA/CCA RATIO:  1.4 ECA: 53 cm/sec RIGHT CAROTID ARTERY: Minor echogenic shadowing plaque formation. No hemodynamically significant right ICA stenosis, velocity elevation, or turbulent flow. Degree of narrowing less than 50%. RIGHT VERTEBRAL ARTERY:  Antegrade LEFT CAROTID ARTERY:  Similar scattered minor echogenic plaque formation. No hemodynamically significant left ICA stenosis, velocity elevation, or turbulent flow. LEFT VERTEBRAL ARTERY:  Antegrade IMPRESSION: Minor carotid atherosclerosis. No hemodynamically significant ICA stenosis. Degree of narrowing less than 50% bilaterally by ultrasound criteria. Patent antegrade vertebral flow bilaterally Electronically Signed   By: Jerilynn Mages.  Shick M.D.   On: 06/09/2019 12:37   ECHOCARDIOGRAM COMPLETE  Result Date: 06/09/2019    ECHOCARDIOGRAM REPORT   Patient Name:   ZAKIYAH DIOP Date of Exam: 06/09/2019 Medical Rec #:  [id number]          Height:       64.0 in Accession #:    [id number]         Weight:       133.7 lb Date of Birth:  [birth year]          BSA:          1.649 m Patient Age:    86 years           BP:           155/103 mmHg Patient Gender: F                  HR:           95 bpm. Exam Location:  [facility] Procedure:  2D Echo, Cardiac Doppler, Color Doppler and Strain Analysis Indications:     TIA 435.9  History:         Patient has no prior history of Echocardiogram examinations.                  Stroke; Risk Factors:Diabetes and Hypertension.  Sonographer:     Sherrie Sport RDCS (AE) Referring Phys:  [id number] Athena Masse Diagnosing Phys: Bartholome Bill MD IMPRESSIONS  1. Left ventricular ejection fraction, by estimation, is 55 to 60%. The left ventricle has normal function. The left ventricle has no regional wall motion abnormalities. There is moderate left ventricular hypertrophy. Left ventricular diastolic parameters are consistent with Grade I diastolic dysfunction (impaired relaxation).  2. Right ventricular systolic function is normal. The right ventricular size is normal. There is normal pulmonary artery systolic pressure.  3. The mitral valve is grossly normal. Trivial mitral valve regurgitation.  4. The aortic valve is grossly normal. Aortic valve regurgitation is not visualized. FINDINGS  Left Ventricle: Left ventricular ejection  fraction, by estimation, is 55 to 60%. The left ventricle has normal function. The left ventricle has no regional wall motion abnormalities. The left ventricular internal cavity size was normal in size. There is  moderate left ventricular hypertrophy. Left ventricular diastolic parameters are consistent with Grade I diastolic dysfunction (impaired relaxation). Right Ventricle: The right ventricular size is normal. No increase in right ventricular wall thickness. Right ventricular systolic function is normal. There is normal pulmonary artery systolic pressure. The tricuspid regurgitant velocity is 1.78 m/s, and  with an assumed right atrial pressure of 10 mmHg, the estimated right ventricular systolic pressure is 62.8 mmHg. Left Atrium: Left atrial size was normal in size. Right Atrium: Right atrial size was normal in size. Pericardium: There is no evidence of pericardial effusion. Mitral Valve: The mitral valve is grossly normal. Trivial mitral valve regurgitation. Tricuspid Valve: The tricuspid valve is not well visualized. Tricuspid valve regurgitation is trivial. Aortic Valve: The aortic valve is grossly normal. Aortic valve regurgitation is not visualized. Aortic valve mean gradient measures 2.5 mmHg. Aortic valve peak gradient measures 4.3 mmHg. Aortic valve area, by VTI measures 2.99 cm. Pulmonic Valve: The pulmonic valve was not well visualized. Pulmonic valve regurgitation is not visualized. Aorta: The aortic root is normal in size and structure. IAS/Shunts: The interatrial septum was not assessed.  LEFT VENTRICLE PLAX 2D LVIDd:         3.93 cm  Diastology LVIDs:         2.77 cm  LV e' lateral:   5.55 cm/s LV PW:         1.03 cm  LV E/e' lateral: 11.0 LV IVS:        0.85 cm  LV e' medial:    5.11 cm/s LVOT diam:     2.00 cm  LV E/e' medial:  12.0 LV SV:         56 LV SV Index:   34 LVOT Area:     3.14 cm                          3D Volume EF:                         3D EF:        64 %  LV EDV:       75 ml                         LV ESV:       27 ml                         LV SV:        49 ml RIGHT VENTRICLE RV Basal diam:  3.77 cm RV S prime:     13.50 cm/s LEFT ATRIUM           Index      RIGHT ATRIUM          Index LA diam:      2.50 cm 1.52 cm/m RA Area:     9.75 cm LA Vol (A4C): 16.4 ml 9.95 ml/m RA Volume:   16.10 ml 9.77 ml/m  AORTIC VALVE                   PULMONIC VALVE AV Area (Vmax):    3.02 cm    PV Vmax:        0.75 m/s AV Area (Vmean):   2.97 cm    PV Peak grad:   2.3 mmHg AV Area (VTI):     2.99 cm    RVOT Peak grad: 4 mmHg AV Vmax:           104.05 cm/s AV Vmean:          76.250 cm/s AV VTI:            0.188 m AV Peak Grad:      4.3 mmHg AV Mean Grad:      2.5 mmHg LVOT Vmax:         99.90 cm/s LVOT Vmean:        72.100 cm/s LVOT VTI:          0.179 m LVOT/AV VTI ratio: 0.95  AORTA Ao Root diam: 2.70 cm MITRAL VALVE               TRICUSPID VALVE MV Area (PHT): 2.48 cm    TR Peak grad:   12.7 mmHg MV Decel Time: 306 msec    TR Vmax:        178.00 cm/s MV E velocity: 61.30 cm/s MV A velocity: 80.60 cm/s  SHUNTS MV E/A ratio:  0.76        Systemic VTI:  0.18 m                            Systemic Diam: 2.00 cm Bartholome Bill MD Electronically signed by Bartholome Bill MD Signature Date/Time: 06/09/2019/4:40:49 PM    Final      Labs:   Basic Metabolic Panel: Recent Labs  Lab 06/08/19 2136 06/08/19 2136 06/09/19 0435 06/10/19 0504  NA 126*  --  132* 132*  K 3.1*   < > 3.7 4.4  CL 98  --  99 105  CO2 19*  --  25 21*  GLUCOSE 118*  --  93 104*  BUN 29*  --  27* 23  CREATININE 0.94  --  0.96 0.98  CALCIUM 7.6*  --  8.5* 8.4*  MG  --   --  2.1  --   PHOS  --   --  2.9  --    < > = values in this interval not displayed.  GFR Estimated Creatinine Clearance: 36.2 mL/min (by C-G formula based on SCr of 0.98 mg/dL). Liver Function Tests: No results for input(s): AST, ALT, ALKPHOS, BILITOT, PROT, ALBUMIN in the last 168 hours. No results for input(s): LIPASE,  AMYLASE in the last 168 hours. No results for input(s): AMMONIA in the last 168 hours. Coagulation profile No results for input(s): INR, PROTIME in the last 168 hours.  CBC: Recent Labs  Lab 06/08/19 2136  WBC 8.3  HGB 10.3*  HCT 31.0*  MCV 97.2  PLT 253   Cardiac Enzymes: No results for input(s): CKTOTAL, CKMB, CKMBINDEX, TROPONINI in the last 168 hours. BNP: Invalid input(s): POCBNP CBG: No results for input(s): GLUCAP in the last 168 hours. D-Dimer No results for input(s): DDIMER in the last 72 hours. Hgb A1c No results for input(s): HGBA1C in the last 72 hours. Lipid Profile Recent Labs    06/09/19 0435  CHOL 215*  HDL 71  LDLCALC 132*  TRIG 62  CHOLHDL 3.0   Thyroid function studies No results for input(s): TSH, T4TOTAL, T3FREE, THYROIDAB in the last 72 hours.  Invalid input(s): FREET3 Anemia work up No results for input(s): VITAMINB12, FOLATE, FERRITIN, TIBC, IRON, RETICCTPCT in the last 72 hours. Microbiology Recent Results (from the past 240 hour(s))  SARS CORONAVIRUS 2 (TAT 6-24 HRS) Nasopharyngeal Nasopharyngeal Swab     Status: None   Collection Time: 06/09/19 12:22 AM   Specimen: Nasopharyngeal Swab  Result Value Ref Range Status   SARS Coronavirus 2 NEGATIVE NEGATIVE Final    Comment: (NOTE) SARS-CoV-2 target nucleic acids are NOT DETECTED. The SARS-CoV-2 RNA is generally detectable in upper and lower respiratory specimens during the acute phase of infection. Negative results do not preclude SARS-CoV-2 infection, do not rule out co-infections with other pathogens, and should not be used as the sole basis for treatment or other patient management decisions. Negative results must be combined with clinical observations, patient history, and epidemiological information. The expected result is Negative. Fact Sheet for Patients: SugarRoll.be Fact Sheet for Healthcare  Providers: https://www.woods-mathews.com/ This test is not yet approved or cleared by the [place] FDA and  has been authorized for detection and/or diagnosis of SARS-CoV-2 by FDA under an Emergency Use Authorization (EUA). This EUA will remain  in effect (meaning this test can be used) for the duration of the COVID-19 declaration under Section 56 4(b)(1) of the Act, 21 U.S.C. section 360bbb-3(b)(1), unless the authorization is terminated or revoked sooner. Performed at [facility], [place] [address] [place], [place] [address]   Respiratory Panel by RT PCR (Flu A&B, Covid) -     Status: Abnormal   Collection Time: 06/09/19 12:22 AM  Result Value Ref Range Status   SARS Coronavirus 2 by RT PCR TEST WILL BE CREDITED (A) NEGATIVE Final   Influenza A by PCR TEST WILL BE CREDITED (A) NEGATIVE Final   Influenza B by PCR TEST WILL BE CREDITED (A) NEGATIVE Final    Comment: (NOTE) The Xpert Xpress SARS-CoV-2/FLU/RSV assay is intended as an aid in  the diagnosis of influenza from Nasopharyngeal swab specimens and  should not be used as a sole basis for treatment. Nasal washings and  aspirates are unacceptable for Xpert Xpress SARS-CoV-2/FLU/RSV  testing. Fact Sheet for Patients: PinkCheek.be Fact Sheet for Healthcare Providers: GravelBags.it This test is not yet approved or cleared by the [place] FDA and  has been authorized for detection and/or diagnosis of SARS-CoV-2 by  FDA under an Emergency Use Authorization (EUA).  This EUA will remain  in effect (meaning this test can be used) for the duration of the  Covid-19 declaration under Section 564(b)(1) of the Act, 21  U.S.C. section 360bbb-3(b)(1), unless the authorization is  terminated or revoked. Performed at [facility], [place] [address] [place], [place] [address]      Discharge Instructions:   Discharge Instructions    Diet - low sodium  heart healthy   Complete by: As directed    Face-to-face encounter (required for Medicare/Medicaid patients)   Complete by: As directed    I Izaias Krupka certify that this patient is under my care and that I, or a nurse practitioner or physician's assistant working with me, had a face-to-face encounter that meets the physician face-to-face encounter requirements with this patient on 06/10/2019. The encounter with the patient was in whole, or in part for the following medical condition(s) which is the primary reason for home health care (List medical condition): generalized weakness , ?TIA.   The encounter with the patient was in whole, or in part, for the following medical condition, which is the primary reason for home health care: generalized weakness, ?TIA   I certify that, based on my findings, the following services are medically necessary home health services: Physical therapy   Reason for Medically Necessary Home Health Services: Therapy- Personnel officer, Public librarian   My clinical findings support the need for the above services: Unable to leave home safely without assistance and/or assistive device   Further, I certify that my clinical findings support that this patient is homebound due to: Unable to leave home safely without assistance   Home Health   Complete by: As directed    To provide the following care/treatments:  PT OT     Increase activity slowly   Complete by: As directed      Allergies as of 06/11/2019      Reactions   Ciprofloxacin Other (See Comments)   aggravates her neuropathy   Erythromycin Other (See Comments)   Gi distress   Metronidazole    Other reaction(s): Abdominal Pain   Sulfur Other (See Comments)   Doesn't remember   Tizanidine Other (See Comments)   GI upset   Penicillins Rash   Has patient had a PCN reaction causing immediate rash, facial/tongue/throat swelling, SOB or lightheadedness with hypotension: No Has patient had a  PCN reaction causing severe rash involving mucus membranes or skin necrosis: No Has patient had a PCN reaction that required hospitalization No Has patient had a PCN reaction occurring within the last 10 years: no If all of the above answers are "NO", then may proceed with Cephalosporin use.   Sulfamethoxazole Rash   all over body      Medication List    STOP taking these medications   meloxicam 7.5 MG tablet Commonly known as: MOBIC   metoprolol succinate 50 MG 24 hr tablet Commonly known as: TOPROL-XL   torsemide 20 MG tablet Commonly known as: DEMADEX     TAKE these medications   acetaminophen 500 MG tablet Commonly known as: TYLENOL Take 250 mg by mouth daily as needed.   albuterol (5 MG/ML) 0.5% nebulizer solution Commonly known as: PROVENTIL Take 2.5 mg by nebulization every 6 (six) hours as needed for wheezing or shortness of breath.   aspirin EC 81 MG tablet Take 1 tablet (81 mg total) by mouth daily.   atorvastatin 40 MG tablet Commonly known as: LIPITOR Take 1 tablet (40  mg total) by mouth at bedtime.   azelastine 0.1 % nasal spray Commonly known as: ASTELIN Place into the nose 2 (two) times daily. Use in each nostril as directed   dicyclomine 20 MG tablet Commonly known as: BENTYL Take 20 mg by mouth every 6 (six) hours.   diphenhydramine-acetaminophen 25-500 MG Tabs tablet Commonly known as: TYLENOL PM Take 1 tablet by mouth at bedtime as needed.   docusate sodium 100 MG capsule Commonly known as: COLACE Take 1 capsule by mouth daily as needed.   esomeprazole 40 MG capsule Commonly known as: NEXIUM TAKE 1 CAPSULE BY MOUTH TWICE DAILY. TAKE 30 MINUTES BEFORE MEALS.   gabapentin 100 MG capsule Commonly known as: NEURONTIN Take 2 capsules (200 mg total) by mouth at bedtime.   gentamicin cream 0.1 % Commonly known as: GARAMYCIN Apply 1 application topically 2 (two) times daily.   ipratropium 0.06 % nasal spray Commonly known as:  ATROVENT Place 2 sprays into the nose 2 (two) times daily as needed.   levofloxacin 250 MG tablet Commonly known as: LEVAQUIN Take 250 mg by mouth daily.   levothyroxine 50 MCG tablet Commonly known as: SYNTHROID Take 50 mcg by mouth daily before breakfast.   LORazepam 0.5 MG tablet Commonly known as: ATIVAN Take 0.25 mg by mouth 2 (two) times daily as needed.   meclizine 12.5 MG tablet Commonly known as: ANTIVERT Take 12.5 mg by mouth 3 (three) times daily as needed.   ondansetron 4 MG disintegrating tablet Commonly known as: ZOFRAN-ODT Take 4 mg by mouth every 8 (eight) hours as needed.   sertraline 25 MG tablet Commonly known as: ZOLOFT Take 1 tablet (25 mg total) by mouth daily.   tamsulosin 0.4 MG Caps capsule Commonly known as: FLOMAX TAKE 1 CAPSULE EVERY DAY   tobramycin (PF) 300 MG/5ML nebulizer solution Commonly known as: TOBI Take 300 mg by nebulization 2 (two) times daily.   traMADol 50 MG tablet Commonly known as: Ultram Take 1 tablet (50 mg total) by mouth every 6 (six) hours as needed.   traZODone 50 MG tablet Commonly known as: DESYREL Take 50 mg by mouth at bedtime as needed.      Follow-up Information    Kirk Ruths, MD. Go on 06/14/2019.   Specialty: Internal Medicine Why: Appointment at 2:45pm Contact information: [place] [address] [phone]        [facility] Follow up.   Specialty: [place] Why: Physical therapy, occupational therapy  Contact information: [place] [address] [phone]            Time coordinating discharge: 27 minutes  Signed:  Jaylee Lantry  [facility] 06/11/2019, 9:48 AM

## 2019-06-20 IMAGING — CT CT CHEST W/O CM
1 series · 14 of 34 positions shown, 18 images · non-contrast
Comparison: 07/19/2016 chest CT.

CLINICAL DATA: Productive cough. Follow-up abnormal chest CT.
Remote history of right breast cancer treated with lumpectomy,
chemotherapy and radiation therapy. Never smoker.

EXAM:
CT CHEST WITHOUT CONTRAST
TECHNIQUE: Multidetector CT imaging of the chest was performed following the
standard protocol without IV contrast.

[Series 2: thorax · axial · 0.69mm/px · z∈[-656,-402]mm · 14 of 151 slices shown, 18 images]
[im 12/151  mediastinal]
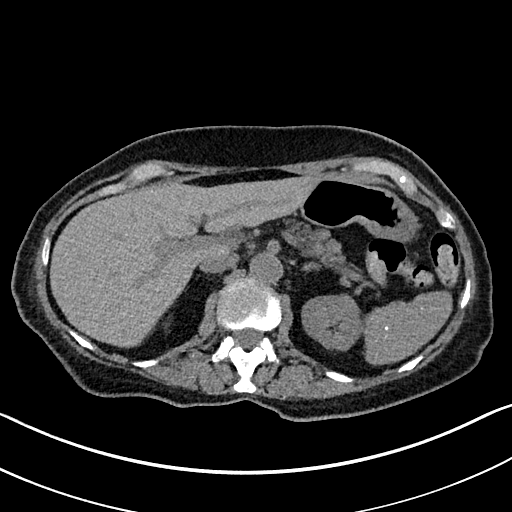
[im 12/151  lung]
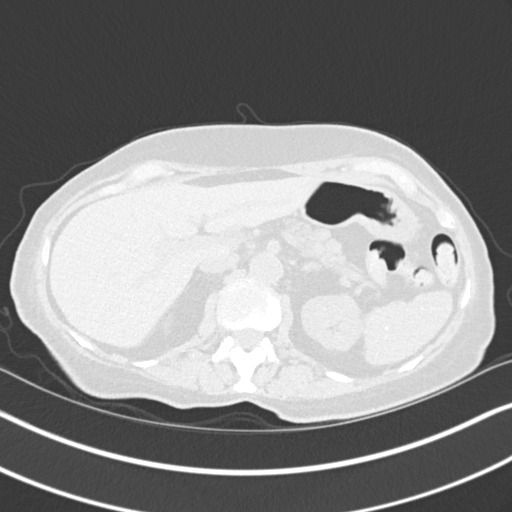
[im 23/151  lung]
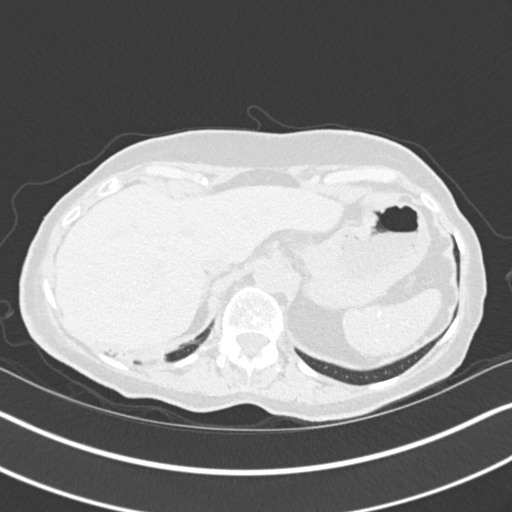
[im 31/151  lung]
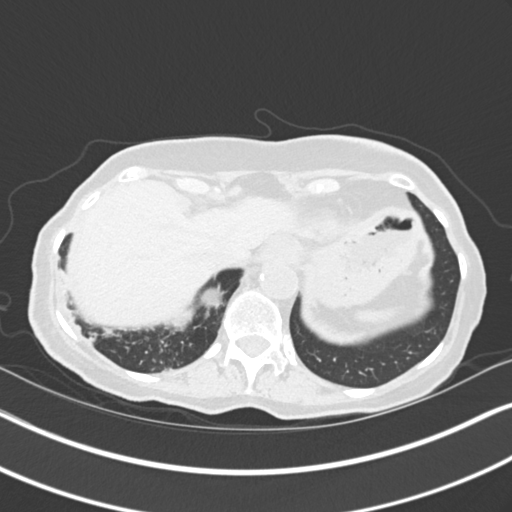
[im 45/151  lung]
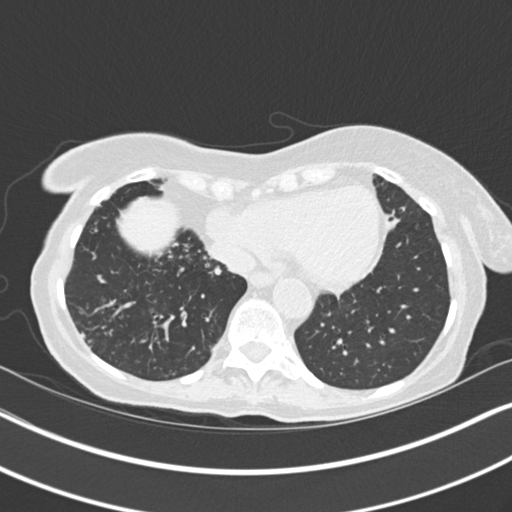
[im 56/151  mediastinal]
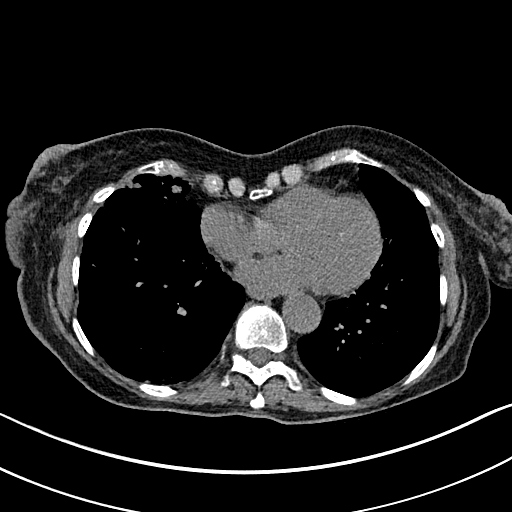
[im 56/151  lung]
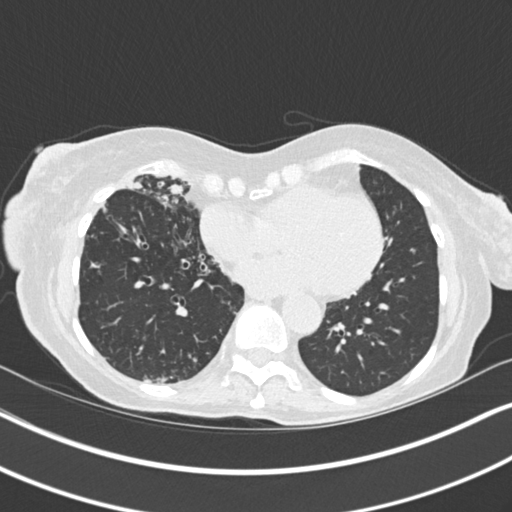
[im 62/151  lung]
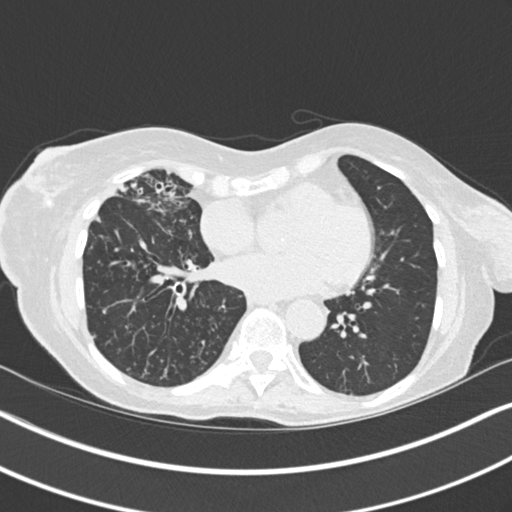
[im 71/151  lung]
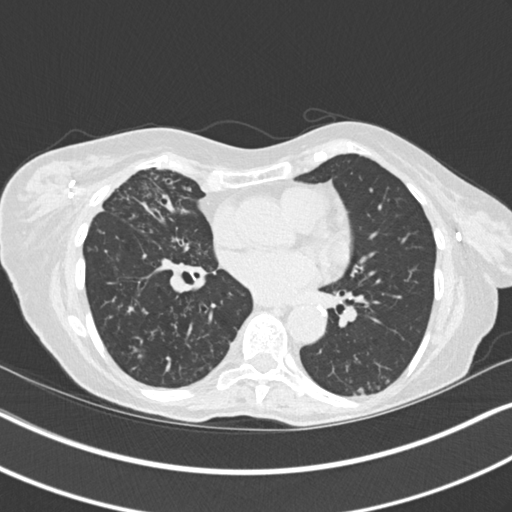
[im 81/151  lung]
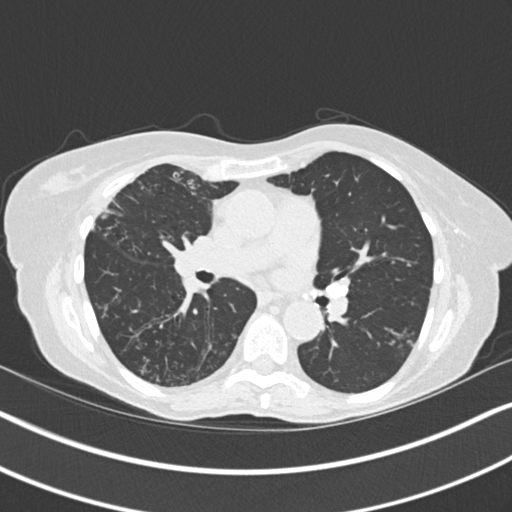
[im 89/151  mediastinal]
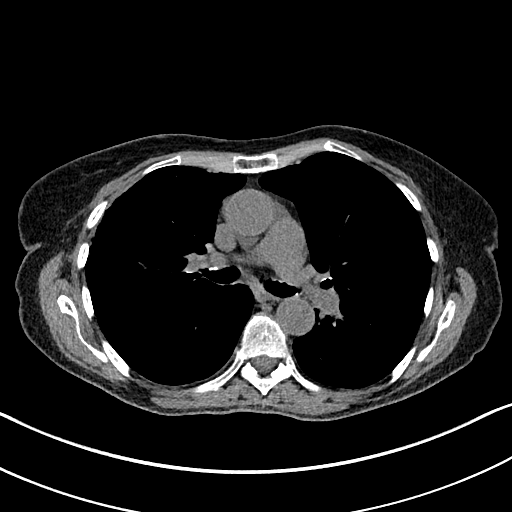
[im 89/151  lung]
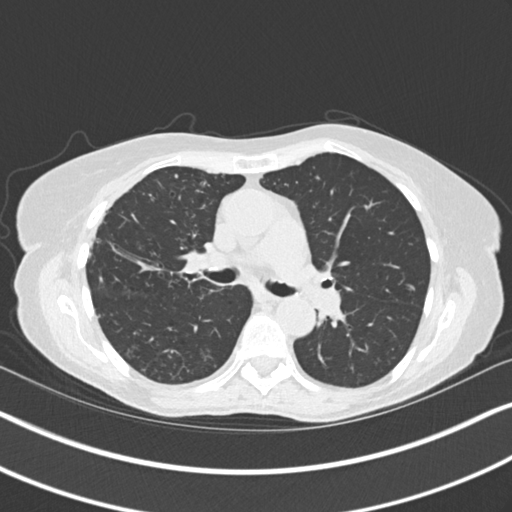
[im 95/151  lung]
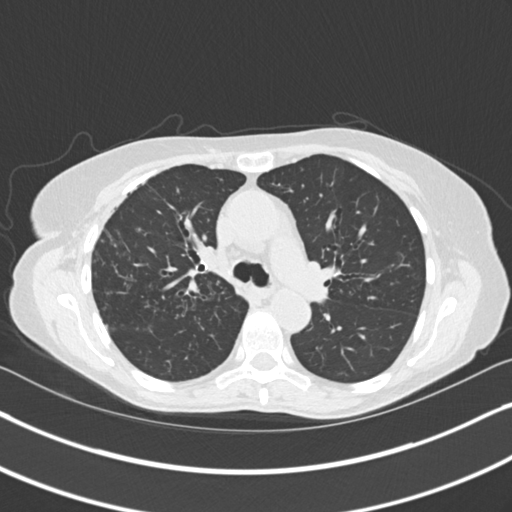
[im 112/151  lung]
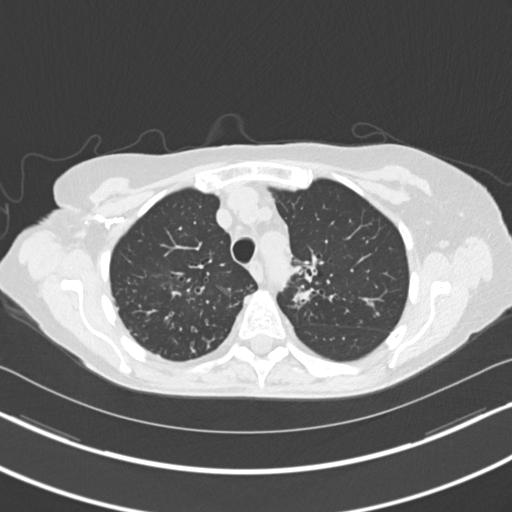
[im 121/151  lung]
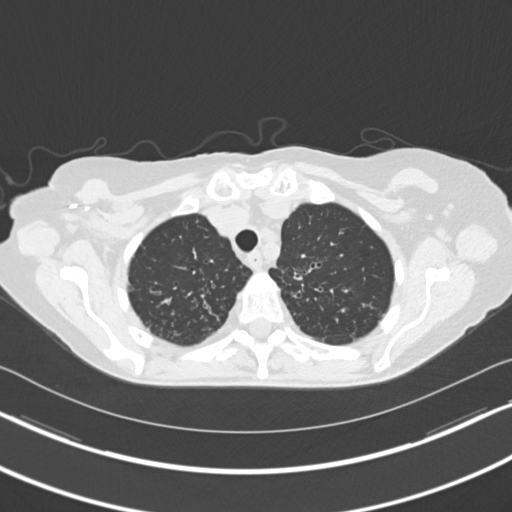
[im 128/151  mediastinal]
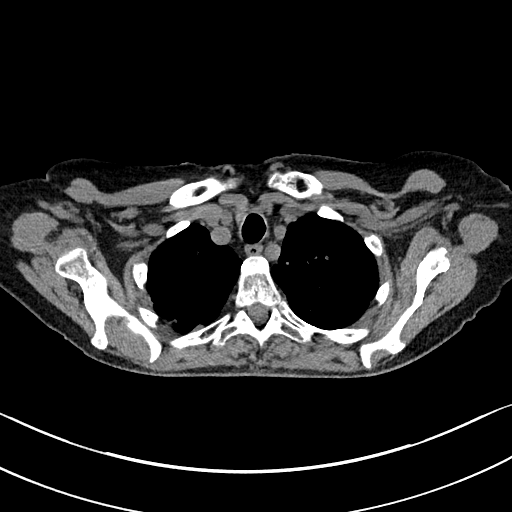
[im 128/151  lung]
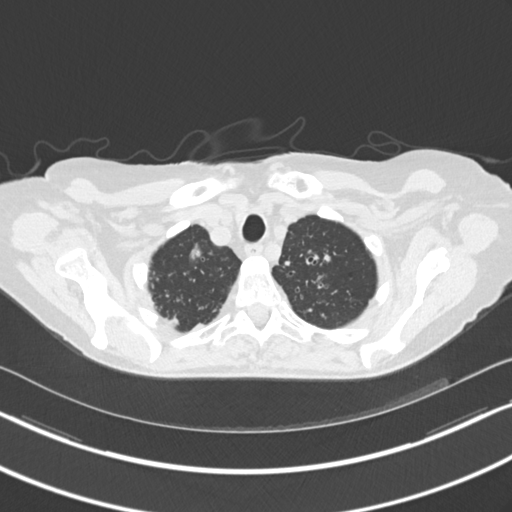
[im 139/151  lung]
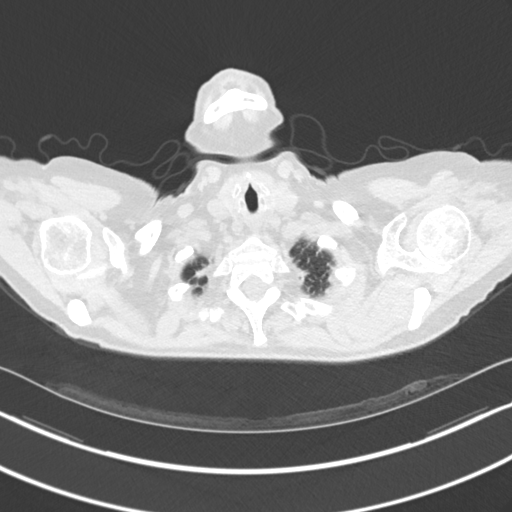

[14 of 34 positions shown; findings below may reference images not displayed]

FINDINGS: Cardiovascular: Normal heart size. No significant pericardial
fluid/thickening. Atherosclerotic nonaneurysmal thoracic aorta.
Normal caliber pulmonary arteries.

Mediastinum/Nodes: No discrete thyroid nodules. Unremarkable
esophagus. No pathologically enlarged axillary, mediastinal or gross
hilar lymph nodes, noting limited sensitivity for the detection of
hilar adenopathy on this noncontrast study. Surgical clips are noted
in the right axilla. Stable coarsely calcified left subcarinal and
left hilar nodes from prior granulomatous disease.

Lungs/Pleura: No pneumothorax. No pleural effusion. No lung masses.
There is extensive patchy cylindrical and varicoid bronchiectasis
throughout both lungs involving all lung lobes, most prominent in
the right middle and right lower lobes. There is associated
extensive patchy tree-in-bud opacity and scattered mucoid impaction
at the areas of bronchiectasis in both lungs. There is a new
subsolid 1.3 cm anterior apical right upper lobe nodular opacity
(series 3/ image 25). There is new patchy nodular consolidation in
the medial left upper lobe (series 3/images 38 and 43). The
tree-in-bud opacities and bronchiectasis are otherwise unchanged.
Bandlike subpleural opacities in the right middle lobe, basilar
posterior right lower lobe and lingula with associated volume loss
and distortion are stable and compatible with postinfectious
scarring.

Upper abdomen: Cholecystectomy. Stable appearance of the adrenal
glands without discrete adrenal nodules. Stable granulomatous
calcifications scattered in the spleen.

Musculoskeletal: No aggressive appearing focal osseous lesions. Mild
thoracic spondylosis. Surgical clips are again noted in the breasts
bilaterally.
IMPRESSION: Spectrum of findings compatible with extensive chronic infectious
bronchiolitis due to atypical mycobacterial infection (EDILFONSO), with
cylindrical and varicoid bronchiectasis, tree-in-bud opacities,
mucoid impaction and foci of postinfectious scarring throughout both
lungs involving all lung lobes. New subsolid right upper lobe
nodular opacity and nodular foci of consolidation in the left upper
lobe are compatible with mild progression of this chronic infectious
disease. Consider a post treatment follow-up high-resolution chest
CT in 6 months.

Aortic Atherosclerosis ([id number]-161.1).

## 2019-07-14 ENCOUNTER — Other Ambulatory Visit: Payer: Self-pay | Admitting: Physician Assistant

## 2019-07-14 DIAGNOSIS — R39198 Other difficulties with micturition: Secondary | ICD-10-CM

## 2019-08-23 ENCOUNTER — Other Ambulatory Visit: Payer: Self-pay | Admitting: Podiatry

## 2019-09-02 ENCOUNTER — Other Ambulatory Visit: Payer: Self-pay | Admitting: Psychiatry

## 2019-10-13 ENCOUNTER — Emergency Department
Admission: EM | Admit: 2019-10-13 | Discharge: 2019-10-13 | Disposition: A | Discharge status: Home / Self Care | Payer: Medicare PPO | Attending: Emergency Medicine | Admitting: Emergency Medicine

## 2019-10-13 ENCOUNTER — Emergency Department: Payer: Medicare PPO

## 2019-10-13 DIAGNOSIS — Z79899 Other long term (current) drug therapy: Secondary | ICD-10-CM | POA: Diagnosis not present

## 2019-10-13 DIAGNOSIS — Z20822 Contact with and (suspected) exposure to covid-19: Secondary | ICD-10-CM | POA: Diagnosis not present

## 2019-10-13 DIAGNOSIS — Z853 Personal history of malignant neoplasm of breast: Secondary | ICD-10-CM | POA: Diagnosis not present

## 2019-10-13 DIAGNOSIS — N189 Chronic kidney disease, unspecified: Secondary | ICD-10-CM | POA: Diagnosis not present

## 2019-10-13 DIAGNOSIS — Z7951 Long term (current) use of inhaled steroids: Secondary | ICD-10-CM | POA: Insufficient documentation

## 2019-10-13 DIAGNOSIS — R0602 Shortness of breath: Secondary | ICD-10-CM | POA: Diagnosis not present

## 2019-10-13 DIAGNOSIS — I129 Hypertensive chronic kidney disease with stage 1 through stage 4 chronic kidney disease, or unspecified chronic kidney disease: Secondary | ICD-10-CM | POA: Diagnosis not present

## 2019-10-13 DIAGNOSIS — Z7982 Long term (current) use of aspirin: Secondary | ICD-10-CM | POA: Diagnosis not present

## 2019-10-13 DIAGNOSIS — J471 Bronchiectasis with (acute) exacerbation: Secondary | ICD-10-CM

## 2019-10-13 DIAGNOSIS — N179 Acute kidney failure, unspecified: Secondary | ICD-10-CM | POA: Diagnosis not present

## 2019-10-13 LAB — COMPREHENSIVE METABOLIC PANEL
ALT: 14 U/L (ref 0–44)
AST: 27 U/L (ref 15–41)
Albumin: 4.4 g/dL (ref 3.5–5.0)
Alkaline Phosphatase: 100 U/L (ref 38–126)
Anion gap: 16 — ABNORMAL HIGH (ref 5–15)
BUN: 39 mg/dL — ABNORMAL HIGH (ref 8–23)
CO2: 25 mmol/L (ref 22–32)
Calcium: 9.7 mg/dL (ref 8.9–10.3)
Chloride: 94 mmol/L — ABNORMAL LOW (ref 98–111)
Creatinine, Ser: 1.52 mg/dL — ABNORMAL HIGH (ref 0.44–1.00)
GFR calc Af Amer: 36 mL/min — ABNORMAL LOW (ref >60)
GFR calc non Af Amer: 31 mL/min — ABNORMAL LOW (ref >60)
Glucose, Bld: 100 mg/dL — ABNORMAL HIGH (ref 70–99)
Potassium: 3.5 mmol/L (ref 3.5–5.1)
Sodium: 135 mmol/L (ref 135–145)
Total Bilirubin: 1.2 mg/dL (ref 0.3–1.2)
Total Protein: 7.6 g/dL (ref 6.5–8.1)

## 2019-10-13 LAB — CBC WITH DIFFERENTIAL/PLATELET
Abs Immature Granulocytes: 0.03 10*3/uL (ref 0.00–0.07)
Basophils Absolute: 0 10*3/uL (ref 0.0–0.1)
Basophils Relative: 0 %
Eosinophils Absolute: 0.2 10*3/uL (ref 0.0–0.5)
Eosinophils Relative: 3 %
HCT: 32.9 % — ABNORMAL LOW (ref 36.0–46.0)
Hemoglobin: 10.9 g/dL — ABNORMAL LOW (ref 12.0–15.0)
Immature Granulocytes: 0 %
Lymphocytes Relative: 11 %
Lymphs Abs: 1 10*3/uL (ref 0.7–4.0)
MCH: 32.5 pg (ref 26.0–34.0)
MCHC: 33.1 g/dL (ref 30.0–36.0)
MCV: 98.2 fL (ref 80.0–100.0)
Monocytes Absolute: 0.7 10*3/uL (ref 0.1–1.0)
Monocytes Relative: 7 %
Neutro Abs: 7.6 10*3/uL (ref 1.7–7.7)
Neutrophils Relative %: 79 %
Platelets: 234 10*3/uL (ref 150–400)
RBC: 3.35 MIL/uL — ABNORMAL LOW (ref 3.87–5.11)
RDW: 13.9 % (ref 11.5–15.5)
WBC: 9.6 10*3/uL (ref 4.0–10.5)
nRBC: 0 % (ref 0.0–0.2)

## 2019-10-13 LAB — TROPONIN I (HIGH SENSITIVITY)
Troponin I (High Sensitivity): 9 ng/L (ref <18)
Troponin I (High Sensitivity): 9 ng/L (ref <18)

## 2019-10-13 LAB — SARS CORONAVIRUS 2 BY RT PCR (HOSPITAL ORDER, PERFORMED IN ~~LOC~~ HOSPITAL LAB): SARS Coronavirus 2: NEGATIVE

## 2019-10-13 LAB — BRAIN NATRIURETIC PEPTIDE: B Natriuretic Peptide: 85.9 pg/mL (ref 0.0–100.0)

## 2019-10-13 MED ORDER — SODIUM CHLORIDE 0.9 % IV BOLUS
500.0000 mL | Freq: Once | INTRAVENOUS | Status: AC
  Administered 2019-10-13: 500 mL via INTRAVENOUS

## 2019-10-13 NOTE — ED Triage Notes (Signed)
BIB [facility] from home c/o SOB x 1day. Pt reports using albuterol inhaler x 2 today. Pt with hx of pulmonary edema.

## 2019-10-13 NOTE — ED Notes (Signed)
Pt educated on possibility of insurance not covering ride with EMS home due to pt ability to walk with a walker. Pt reports understanding of information but states her or her husband do not drive and she has no family to call for transport.

## 2019-10-13 NOTE — ED Notes (Signed)
Assisted pt to bedside commode at this time. Pt with no other needs.

## 2019-10-13 NOTE — ED Provider Notes (Signed)
[facility] Emergency Department Provider Note   ____________________________________________   First MD Initiated Contact with Patient 10/13/19 0033     (approximate)  I have reviewed the triage vital signs and the nursing notes.   HISTORY  Chief Complaint Shortness of Breath    HPI Christine Vaughan is a 86 y.o. female brought to the ED from home via EMS with a chief complaint of shortness of breath.  Patient has a history of bronchiectasis, MAI, PE, self-reported COPD and CHF on continuous 2 L oxygen.  Reports shortness of breath x1 to 2 days.  On 2 L nasal cannula oxygen EMS reports saturations 90%.  Increased to 94% on 3 L nasal cannula oxygen.  Patient denies fever, cough, chest pain, abdominal pain, nausea, vomiting or diarrhea.  Reports baseline BLE swelling.      Past Medical History:  Diagnosis Date  . Anxiety   . Breast cancer, right ([place]) 2001   Right Lumpectomy, chemo + rad tx's.   . Chronic kidney disease   . Hereditary and idiopathic peripheral neuropathy 12/27/2015  . Hypertension   . MAI (mycobacterium avium-intracellulare) ([place])   . Thyroid disease     Patient Active Problem List   Diagnosis Date Noted  . Orthostatic hypotension 06/10/2019  . TIA (transient ischemic attack) 06/09/2019  . Hyponatremia 06/09/2019  . Hypokalemia 06/09/2019  . Chronic deep vein thrombosis (DVT) of right lower extremity ([place]) 03/25/2018  . Pulmonary embolus ([place]) 03/25/2018  . Use of cane as ambulatory aid 01/15/2018  . MAI (mycobacterium avium-intracellulare) infection ([place]) 06/16/2017  . Headache syndrome 12/27/2015  . Memory change 12/27/2015  . Vitamin B12 deficiency 12/27/2015  . Hereditary and idiopathic peripheral neuropathy 12/27/2015  . Abnormality of gait 12/27/2015  . Hypertension   . Health care maintenance 10/12/2014  . Mixed hyperlipidemia 10/07/2013  . Trigeminal neuralgia 09/19/2013  . Panic anxiety syndrome 09/19/2013  .  Osteopenia 09/19/2013  . Hypothyroid 09/19/2013  . 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 Depression, major, in remission ([place]) 09/19/2013  . Bronchiectasis ([place]) 09/19/2013  . History of radiation therapy 07/15/2011  . Breast cancer ([place]) 07/15/2011  . 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 Optic neuropathy 12/31/2010  . Nuclear cataract 12/31/2010  . Glaucoma suspect 12/31/2010    Past Surgical History:  Procedure Laterality Date  . BACK SURGERY    . BREAST BIOPSY Left    surgical bx pt dose not remember  . BREAST EXCISIONAL BIOPSY Right 2001   + chem rad and armidex  . BREAST LUMPECTOMY  2001  . BREAST SURGERY    . CHOLECYSTECTOMY    . ESOPHAGOGASTRODUODENOSCOPY (EGD) WITH PROPOFOL N/A 09/20/2015   Procedure: ESOPHAGOGASTRODUODENOSCOPY (EGD) WITH PROPOFOL;  Surgeon: Lollie Sails, MD;  Location: [facility] ENDOSCOPY;  Service: Endoscopy;  Laterality: N/A;    Prior to Admission medications   Medication Sig Start Date End Date Taking? Authorizing Provider  acetaminophen (TYLENOL) 500 MG tablet Take 250 mg by mouth daily as needed.     [provider]  albuterol (PROVENTIL) (5 MG/ML) 0.5% nebulizer solution Take 2.5 mg by nebulization every 6 (six) hours as needed for wheezing or shortness of breath.    [provider]  aspirin EC 81 MG tablet Take 1 tablet (81 mg total) by mouth daily. 06/10/19 06/09/20  Jennye Boroughs, MD  atorvastatin (LIPITOR) 40 MG tablet Take 1 tablet (40 mg total) by mouth at bedtime. 06/11/19   Jennye Boroughs, MD  azelastine (ASTELIN) 0.1 % nasal spray Place into the nose 2 (two) times daily. Use in each nostril as directed    [provider]  dicyclomine (BENTYL) 20 MG tablet Take 20 mg by mouth every 6 (six) hours.    [provider]  diphenhydramine-acetaminophen (TYLENOL PM) 25-500 MG TABS tablet Take 1 tablet by mouth at bedtime as needed.     [provider]  docusate  sodium (COLACE) 100 MG capsule Take 1 capsule by mouth daily as needed.    [provider]  esomeprazole (NEXIUM) 40 MG capsule TAKE 1 CAPSULE BY MOUTH TWICE DAILY. TAKE 30 MINUTES BEFORE MEALS. 12/24/16   [provider]  gabapentin (NEURONTIN) 100 MG capsule Take 2 capsules (200 mg total) by mouth at bedtime. 08/31/18   Rainey Pines, MD  gentamicin cream (GARAMYCIN) 0.1 % Apply 1 application topically 2 (two) times daily. 07/28/18   Edrick Kins, DPM  ipratropium (ATROVENT) 0.06 % nasal spray Place 2 sprays into the nose 2 (two) times daily as needed. 03/19/18 06/09/19  [provider]  levofloxacin (LEVAQUIN) 250 MG tablet Take 250 mg by mouth daily. 06/02/19   [provider]  levothyroxine (SYNTHROID, LEVOTHROID) 50 MCG tablet Take 50 mcg by mouth daily before breakfast.    [provider]  LORazepam (ATIVAN) 0.5 MG tablet Take 0.25 mg by mouth 2 (two) times daily as needed. 06/02/19   [provider]  meclizine (ANTIVERT) 12.5 MG tablet Take 12.5 mg by mouth 3 (three) times daily as needed. 01/26/19   [provider]  ondansetron (ZOFRAN-ODT) 4 MG disintegrating tablet Take 4 mg by mouth every 8 (eight) hours as needed. 03/22/19   [provider]  sertraline (ZOLOFT) 25 MG tablet Take 1 tablet (25 mg total) by mouth daily. 11/09/18   Rainey Pines, MD  tamsulosin (FLOMAX) 0.4 MG CAPS capsule TAKE 1 CAPSULE EVERY DAY 07/14/19   Vaillancourt, Samantha, PA-C  tobramycin, PF, (TOBI) 300 MG/5ML nebulizer solution Take 300 mg by nebulization 2 (two) times daily.    [provider]  traMADol (ULTRAM) 50 MG tablet Take 1 tablet (50 mg total) by mouth every 6 (six) hours as needed. 03/12/18   Carrie Mew, MD  traZODone (DESYREL) 50 MG tablet Take 50 mg by mouth at bedtime as needed. 04/30/19   [provider]    Allergies Ciprofloxacin, Erythromycin, Metronidazole, Sulfur, Tizanidine, Penicillins, and  Sulfamethoxazole  Family History  Problem Relation Age of Onset  . Depression Mother   . Brain cancer Mother   . Breast cancer Paternal Aunt     Social History Social History   Tobacco Use  . Smoking status: Never Smoker  . Smokeless tobacco: Never Used  Vaping Use  . Vaping Use: Never used  Substance Use Topics  . Alcohol use: No  . Drug use: No    Review of Systems  Constitutional: No fever/chills Eyes: No visual changes. ENT: No sore throat. Cardiovascular: Denies chest pain. Respiratory: Positive for shortness of breath. Gastrointestinal: No abdominal pain.  No nausea, no vomiting.  No diarrhea.  No constipation. Genitourinary: Negative for dysuria. Musculoskeletal: Negative for back pain. Skin: Negative for rash. Neurological: Negative for headaches, focal weakness or numbness.   ____________________________________________   PHYSICAL EXAM:  VITAL SIGNS: ED Triage Vitals  Enc Vitals Group     BP      Pulse      Resp      Temp      Temp src  SpO2      Weight      Height      Head Circumference      Peak Flow      Pain Score      Pain Loc      Pain Edu?      Excl. in [place]?     Constitutional: Alert and oriented.  Elderly appearing and in mild acute distress. Eyes: Conjunctivae are normal. PERRL. EOMI. Head: Atraumatic. Nose: No congestion/rhinnorhea. Mouth/Throat: Mucous membranes are mildly dry.   Neck: No stridor.   Cardiovascular: Normal rate, regular rhythm. Grossly normal heart sounds.  Good peripheral circulation. Respiratory: Normal respiratory effort.  No retractions. Lungs with bibasilar rales. Gastrointestinal: Soft and nontender. No distention. No abdominal bruits. No CVA tenderness. Musculoskeletal: No lower extremity tenderness.  1+ BLE pitting edema.  No joint effusions. Neurologic:  Normal speech and language. No gross focal neurologic deficits are appreciated.  Skin:  Skin is warm, dry and intact. No rash noted. Psychiatric:  Mood and affect are normal. Speech and behavior are normal.  ____________________________________________   LABS (all labs ordered are listed, but only abnormal results are displayed)  Labs Reviewed  CBC WITH DIFFERENTIAL/PLATELET - Abnormal; Notable for the following components:      Result Value   RBC 3.35 (*)    Hemoglobin 10.9 (*)    HCT 32.9 (*)    All other components within normal limits  COMPREHENSIVE METABOLIC PANEL - Abnormal; Notable for the following components:   Chloride 94 (*)    Glucose, Bld 100 (*)    BUN 39 (*)    Creatinine, Ser 1.52 (*)    GFR calc non Af Amer 31 (*)    GFR calc Af Amer 36 (*)    Anion gap 16 (*)    All other components within normal limits  SARS CORONAVIRUS 2 BY RT PCR (HOSPITAL ORDER, [place] [facility])  BRAIN NATRIURETIC PEPTIDE  TROPONIN I (HIGH SENSITIVITY)  TROPONIN I (HIGH SENSITIVITY)   ____________________________________________  EKG  ED ECG REPORT I, Vondell Babers J, the attending physician, personally viewed and interpreted this ECG.   Date: 10/13/2019  EKG Time: 0040  Rate: 85  Rhythm: normal EKG, normal sinus rhythm  Axis: Normal  Intervals:none  ST&T Change: Nonspecific  ____________________________________________  RADIOLOGY  ED MD interpretation: Chronic lung disease, no acute abnormalities  Official radiology report(s): DG Chest Port 1 View  Result Date: 10/13/2019 CLINICAL DATA:  Shortness of breath. EXAM: PORTABLE CHEST 1 VIEW COMPARISON:  Radiograph 06/08/2019.  CT 03/12/2018 FINDINGS: Underlying chronic lung disease with tree-in-bud opacities and bronchiectasis on prior CT, stable from prior exam. No progression. No acute airspace disease. Unchanged heart size and mediastinal contours. No pleural effusion or pneumothorax. No pulmonary edema. Bilateral axillary surgical clips. No acute osseous abnormalities. IMPRESSION: Chronic lung disease without acute abnormality. No change from prior  exam. Electronically Signed   By: Keith Rake M.D.   On: 10/13/2019 00:57    ____________________________________________   PROCEDURES  Procedure(s) performed (including Critical Care):  .1-3 Lead EKG Interpretation Performed by: Paulette Blanch, MD Authorized by: Paulette Blanch, MD     Interpretation: normal     ECG rate:  82   ECG rate assessment: normal     Rhythm: sinus rhythm     Ectopy: none     Conduction: normal   Comments:     Patient placed on cardiac monitor to evaluate for arrhythmias  ____________________________________________   INITIAL IMPRESSION / ASSESSMENT AND PLAN / ED COURSE  As part of my medical decision making, I reviewed the following data within the [place] notes reviewed and incorporated, Labs reviewed, EKG interpreted, Old chart reviewed, Radiograph reviewed and Notes from prior ED visits     MYRIAN BOTELLO was evaluated in Emergency Department on 10/13/2019 for the symptoms described in the history of present illness. She was evaluated in the context of the global COVID-19 pandemic, which necessitated consideration that the patient might be at risk for infection with the SARS-CoV-2 virus that causes COVID-19. Institutional protocols and algorithms that pertain to the evaluation of patients at risk for COVID-19 are in a state of rapid change based on information released by regulatory bodies including the CDC and federal and state organizations. These policies and algorithms were followed during the patient's care in the ED.    86 year old female presenting with shortness of breath. Differential includes, but is not limited to, viral syndrome, bronchitis including COPD exacerbation, pneumonia, reactive airway disease including asthma, CHF including exacerbation with or without pulmonary/interstitial edema, pneumothorax, ACS, thoracic trauma, and pulmonary embolism.  Will obtain lab work, chest x-ray.  Patient had  albuterol nebulizer at home prior to arrival and is feeling better.   Clinical Course as of Oct 12 704  Wed Oct 13, 2019  0400 Updated patient on all lab results.  Noted minimally elevated anion gap which is likely secondary to AKI.  Will infuse IV fluids.  Anticipate discharge home after completion of IV fluids.  Patient voices no complaints at this time.   [JS]    Clinical Course User Index [JS] Paulette Blanch, MD     ____________________________________________   FINAL CLINICAL IMPRESSION(S) / ED DIAGNOSES  Final diagnoses:  Shortness of breath  AKI (acute kidney injury) ([place])  Bronchiectasis with acute exacerbation [facility])     ED Discharge Orders    None       Note:  This document was prepared using Dragon voice recognition software and may include unintentional dictation errors.   Paulette Blanch, MD 10/13/19 [phone]

## 2019-10-13 NOTE — Discharge Instructions (Signed)
Drink plenty of fluids daily.  Wear your oxygen as directed by your doctor.  Return to the ER for worsening symptoms, persistent vomiting, difficulty breathing or other concerns.

## 2019-10-13 NOTE — ED Notes (Signed)
She is discharged   She is lying in bed awaiting her granddaughter to come and pick her up  No distress assessed she denies pain

## 2019-10-13 NOTE — ED Notes (Signed)
Xray at beside

## 2019-10-13 NOTE — ED Notes (Addendum)
Pt asleep in bed and awaiting transport. Will remove IV when transport arrives

## 2019-12-07 ENCOUNTER — Other Ambulatory Visit: Payer: Self-pay | Admitting: Internal Medicine

## 2019-12-07 DIAGNOSIS — Z1231 Encounter for screening mammogram for malignant neoplasm of breast: Secondary | ICD-10-CM

## 2019-12-29 ENCOUNTER — Other Ambulatory Visit: Payer: Self-pay | Admitting: Internal Medicine

## 2019-12-29 DIAGNOSIS — Z1231 Encounter for screening mammogram for malignant neoplasm of breast: Secondary | ICD-10-CM

## 2020-02-21 ENCOUNTER — Other Ambulatory Visit: Payer: Self-pay | Admitting: Internal Medicine

## 2020-02-21 DIAGNOSIS — Z1231 Encounter for screening mammogram for malignant neoplasm of breast: Secondary | ICD-10-CM

## 2020-02-29 ENCOUNTER — Ambulatory Visit
Admission: RE | Admit: 2020-02-29 | Discharge: 2020-02-29 | Disposition: A | Discharge status: Home / Self Care | Payer: Medicare PPO | Source: Ambulatory Visit | Attending: Internal Medicine | Admitting: Internal Medicine

## 2020-02-29 ENCOUNTER — Other Ambulatory Visit: Payer: Self-pay

## 2020-02-29 DIAGNOSIS — Z1231 Encounter for screening mammogram for malignant neoplasm of breast: Secondary | ICD-10-CM | POA: Insufficient documentation

## 2020-03-05 IMAGING — CT CT ABD-PELV W/ CM
2 of 5 series · 15 of 46 positions shown, 17 images · IV contrast (APPLIED)
Comparison: CT abdomen and pelvis of 09/08/2015

CLINICAL DATA: Dysuria, upper abdominal pain, weight loss of 60
pounds over the last year, history of right breast carcinoma

EXAM:
CT ABDOMEN AND PELVIS WITH CONTRAST
TECHNIQUE: Multidetector CT imaging of the abdomen and pelvis was performed
using the standard protocol following bolus administration of
intravenous contrast.
CONTRAST:  100mL 9OSBAX-PTT IOPAMIDOL (9OSBAX-PTT) INJECTION 61%

[Series 2: routine abd/pel with · axial · 0.68mm/px · z∈[-1011,-636]mm · 12 of 85 slices shown, 14 images]
[im 5/85  soft-tissue]
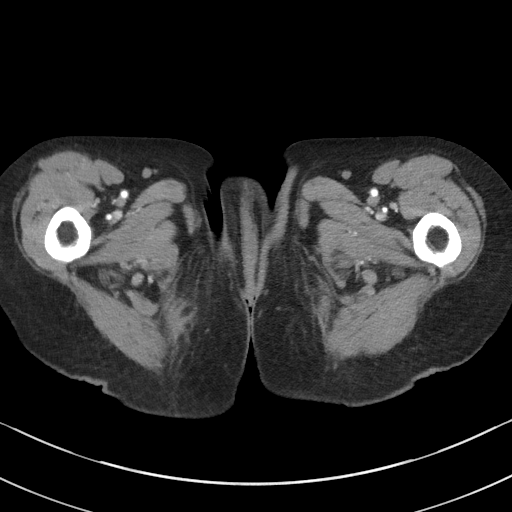
[im 5/85  bone]
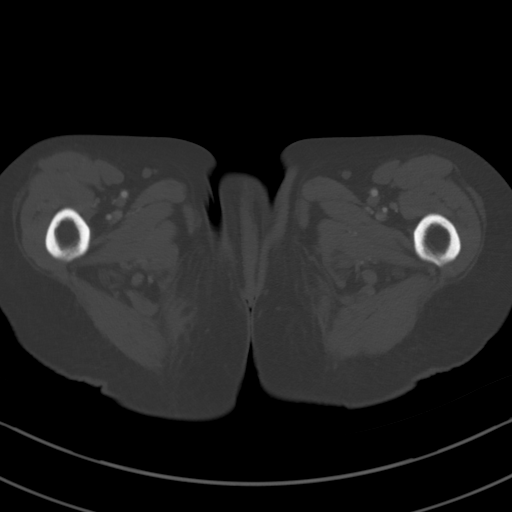
[im 15/85  soft-tissue]
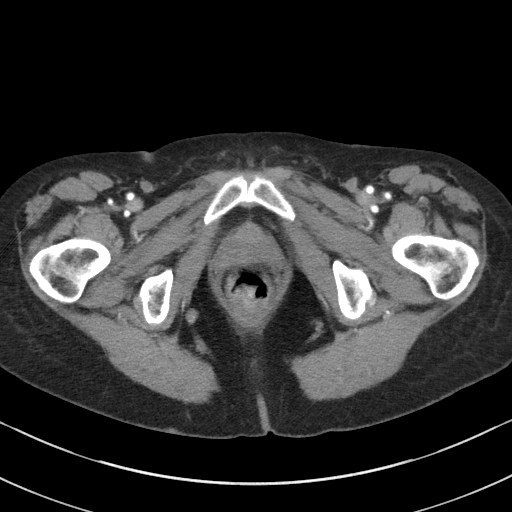
[im 19/85  soft-tissue]
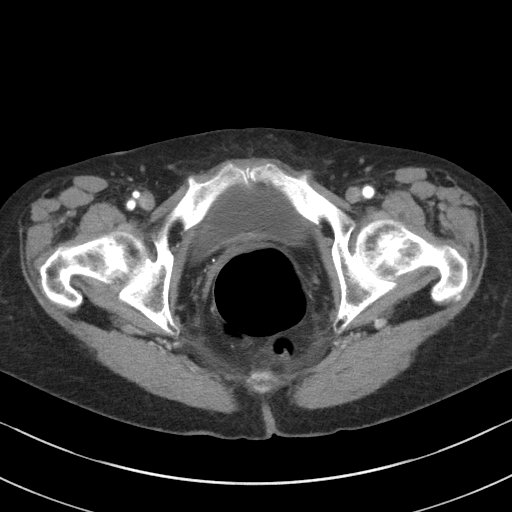
[im 24/85  soft-tissue]
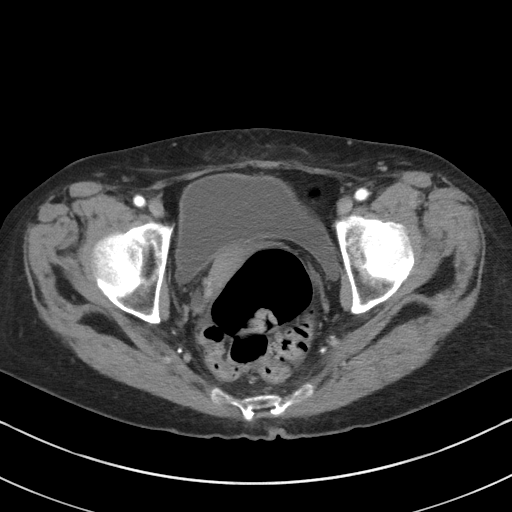
[im 33/85  soft-tissue]
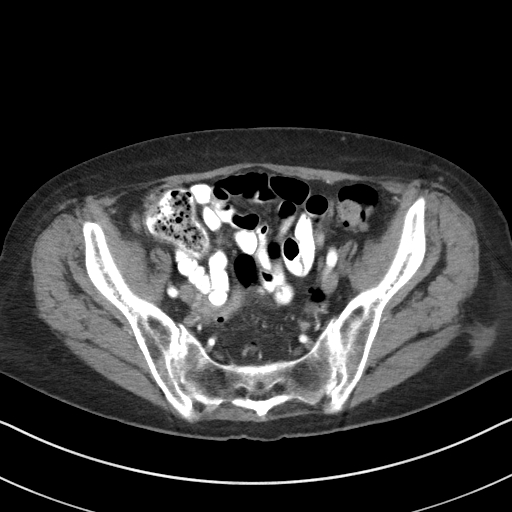
[im 38/85  soft-tissue]
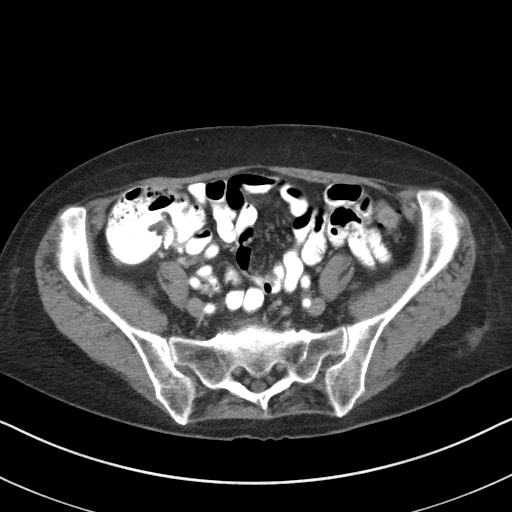
[im 47/85  soft-tissue]
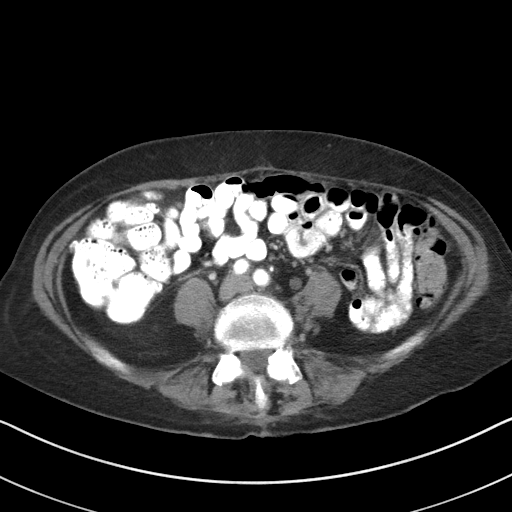
[im 52/85  soft-tissue]
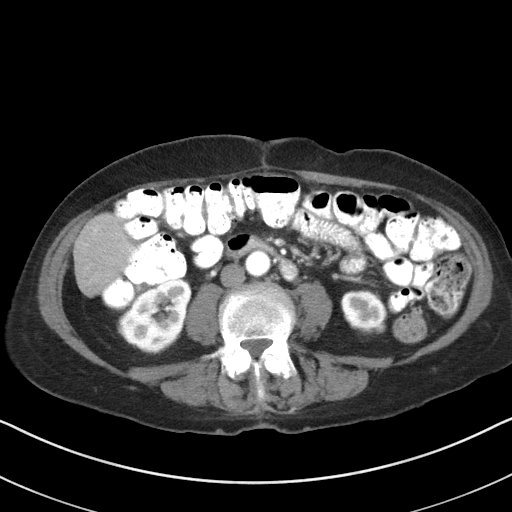
[im 61/85  soft-tissue]
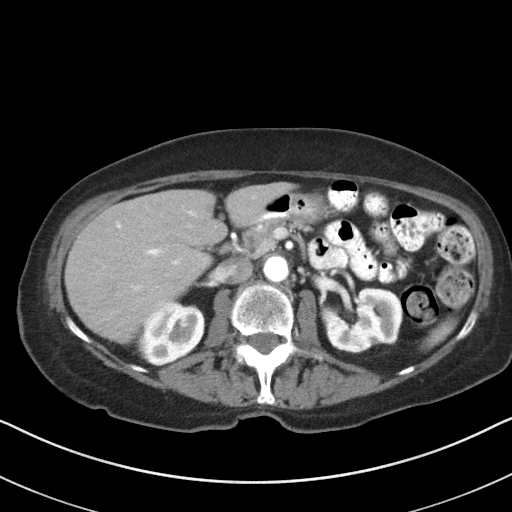
[im 61/85  bone]
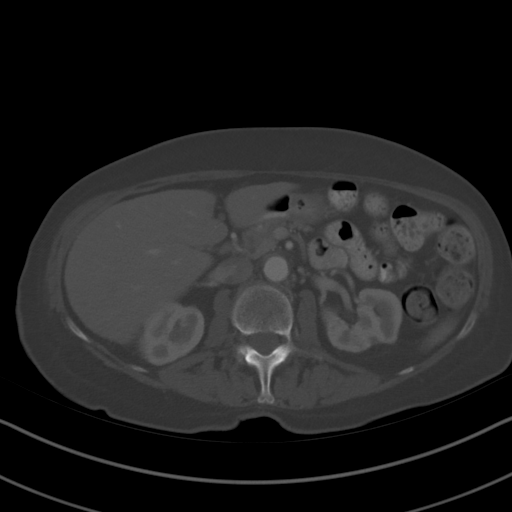
[im 66/85  soft-tissue]
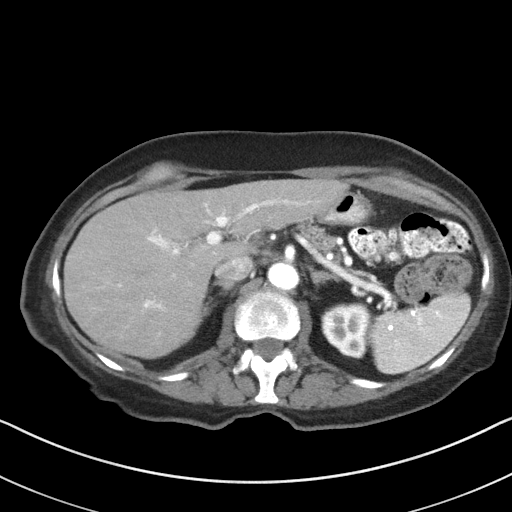
[im 71/85  soft-tissue]
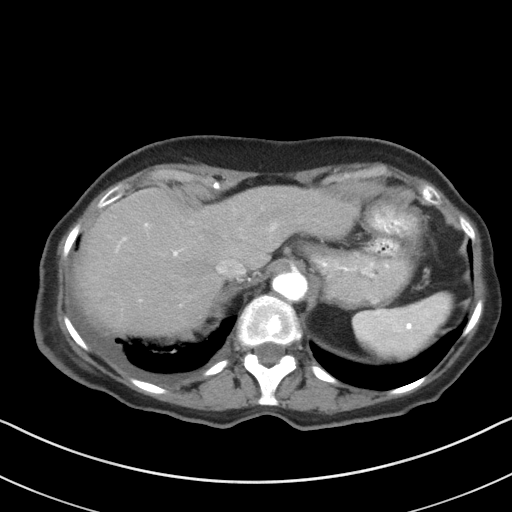
[im 80/85  soft-tissue]
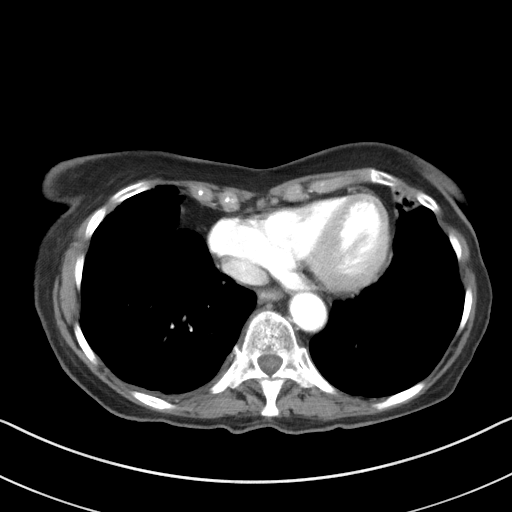

[Series 5: coronal st · coronal · 0.68mm/px · 3 of 73 slices shown]
[im 25/73  soft-tissue]
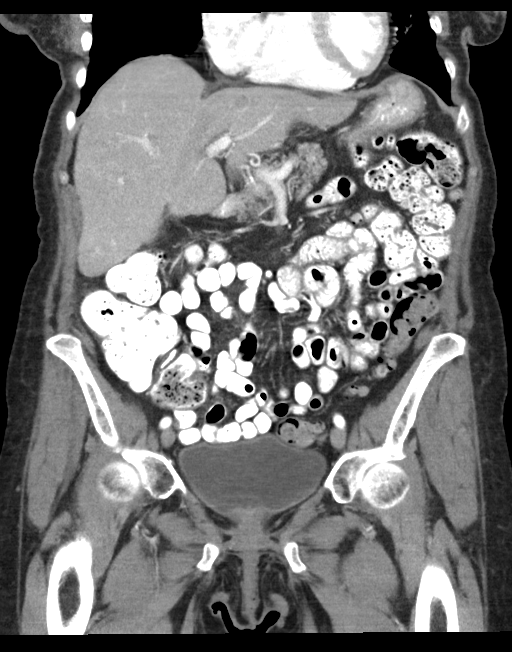
[im 33/73  soft-tissue]
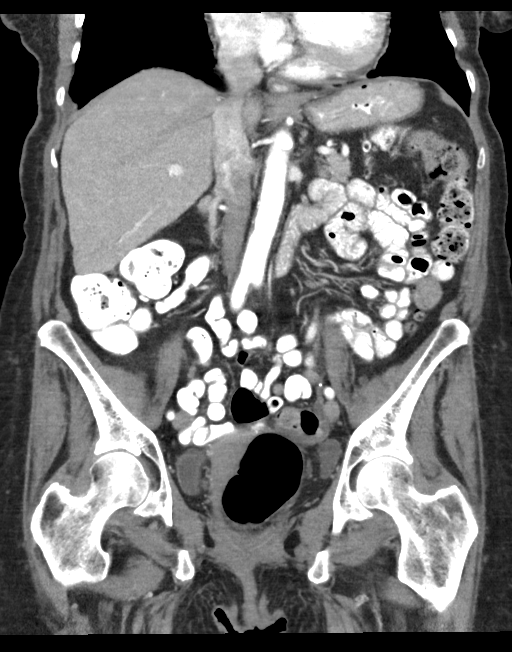
[im 41/73  soft-tissue]
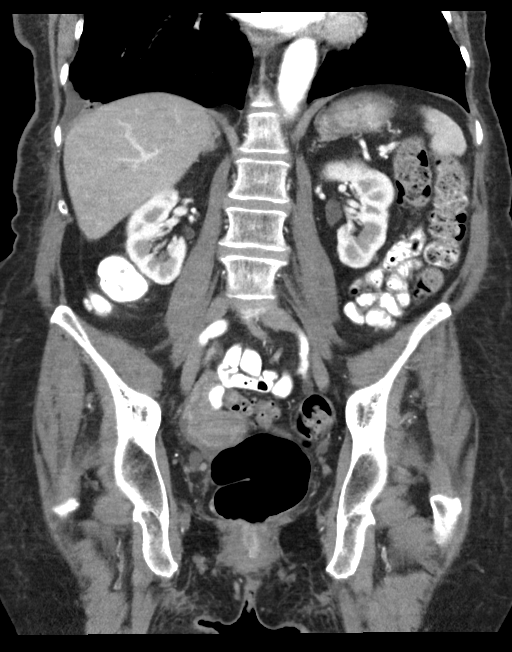

[15 of 46 positions shown; findings below may reference images not displayed]

FINDINGS: Lower chest: Chronic changes within the right middle lobe and
lingula are again noted consistent with scarring and bronchiectatic
change. Minimally prominent markings in the right lower lobe
posteriorly may reflect inflammatory or infectious process, and
there does appear to be a small right pleural effusion present.
Cardiomegaly is stable.

Hepatobiliary: The liver enhances with no focal abnormality and no
ductal dilatation is seen. Surgical clips are present from prior
cholecystectomy. The contours the liver are slightly nodular. Is
there any clinical suspicion of cirrhosis?

Pancreas: The pancreas is normal in size and the pancreatic duct is
not dilated.

Spleen: The spleen is unremarkable although a few calcifications are
present suggesting prior granulomatous disease.

Adrenals/Urinary Tract: The adrenal glands are unremarkable. The
kidneys enhance with no mass or calculus. On delayed images,
pelvocaliceal systems are unremarkable and the proximal ureters
appear normal in caliber. The distal ureters are normal in caliber
and the urinary bladder is unremarkable although not optimally
distended.

Stomach/Bowel: The stomach is not well distended making assessment
very difficult. No gross abnormality is evident. Prominence of the
mucosa of the proximal stomach cannot be excluded on this exam. In
view of the patient's history upper GI or endoscopy would be
recommended no abnormality of the small bowel is seen. Multiple
diverticula are noted diffusely throughout the colon. No
diverticulitis is seen. There is a moderate amount of feces
throughout the colon as well. The terminal ileum is unremarkable,
and the appendix is not visualized but no inflammatory process is
seen within the right lower quadrant.

Vascular/Lymphatic: The abdominal aorta is normal in caliber. No
adenopathy is seen.

Reproductive: The uterus is normal in size for age. No adnexal
lesion is seen. No pelvic wall adenopathy is noted.

Other: Poorly defined opacity in the left buttocks may be due to
injection. Correlate clinically. No abdominal wall hernia is seen.

Musculoskeletal: The lumbar vertebrae are in normal alignment with
mild degenerative disc disease at L5-S1.
IMPRESSION: 1. No definite explanation for the patient's symptoms is seen.
However the mucosa of the proximal stomach could be prominent
although poorly distended on the current study and further
assessment with upper GI or endoscopy is recommended.
2. Small right pleural effusion. Chronic changes involving the right
middle lobe and lingula with scarring and bronchiectatic change.
3. Multiple colonic diverticula.

## 2020-05-12 ENCOUNTER — Emergency Department (HOSPITAL_COMMUNITY)
Admission: EM | Admit: 2020-05-12 | Discharge: 2020-05-13 | Disposition: A | Discharge status: Home / Self Care | Payer: Medicare PPO | Attending: Emergency Medicine | Admitting: Emergency Medicine

## 2020-05-12 ENCOUNTER — Other Ambulatory Visit: Payer: Self-pay

## 2020-05-12 ENCOUNTER — Emergency Department (HOSPITAL_COMMUNITY): Payer: Medicare PPO

## 2020-05-12 ENCOUNTER — Encounter (HOSPITAL_COMMUNITY): Payer: Self-pay

## 2020-05-12 DIAGNOSIS — R0602 Shortness of breath: Secondary | ICD-10-CM | POA: Insufficient documentation

## 2020-05-12 DIAGNOSIS — Z7982 Long term (current) use of aspirin: Secondary | ICD-10-CM | POA: Insufficient documentation

## 2020-05-12 DIAGNOSIS — R079 Chest pain, unspecified: Secondary | ICD-10-CM | POA: Diagnosis not present

## 2020-05-12 DIAGNOSIS — Z853 Personal history of malignant neoplasm of breast: Secondary | ICD-10-CM | POA: Diagnosis not present

## 2020-05-12 DIAGNOSIS — Z20822 Contact with and (suspected) exposure to covid-19: Secondary | ICD-10-CM | POA: Diagnosis not present

## 2020-05-12 DIAGNOSIS — E039 Hypothyroidism, unspecified: Secondary | ICD-10-CM | POA: Insufficient documentation

## 2020-05-12 DIAGNOSIS — I129 Hypertensive chronic kidney disease with stage 1 through stage 4 chronic kidney disease, or unspecified chronic kidney disease: Secondary | ICD-10-CM | POA: Diagnosis not present

## 2020-05-12 DIAGNOSIS — Z79899 Other long term (current) drug therapy: Secondary | ICD-10-CM | POA: Diagnosis not present

## 2020-05-12 DIAGNOSIS — N189 Chronic kidney disease, unspecified: Secondary | ICD-10-CM | POA: Insufficient documentation

## 2020-05-12 LAB — CBC WITH DIFFERENTIAL/PLATELET
Abs Immature Granulocytes: 0.02 10*3/uL (ref 0.00–0.07)
Basophils Absolute: 0.1 10*3/uL (ref 0.0–0.1)
Basophils Relative: 1 %
Eosinophils Absolute: 0.3 10*3/uL (ref 0.0–0.5)
Eosinophils Relative: 5 %
HCT: 34 % — ABNORMAL LOW (ref 36.0–46.0)
Hemoglobin: 10.6 g/dL — ABNORMAL LOW (ref 12.0–15.0)
Immature Granulocytes: 0 %
Lymphocytes Relative: 13 %
Lymphs Abs: 0.9 10*3/uL (ref 0.7–4.0)
MCH: 31.5 pg (ref 26.0–34.0)
MCHC: 31.2 g/dL (ref 30.0–36.0)
MCV: 101.2 fL — ABNORMAL HIGH (ref 80.0–100.0)
Monocytes Absolute: 0.5 10*3/uL (ref 0.1–1.0)
Monocytes Relative: 8 %
Neutro Abs: 5 10*3/uL (ref 1.7–7.7)
Neutrophils Relative %: 73 %
Platelets: 205 10*3/uL (ref 150–400)
RBC: 3.36 MIL/uL — ABNORMAL LOW (ref 3.87–5.11)
RDW: 14.3 % (ref 11.5–15.5)
WBC: 6.7 10*3/uL (ref 4.0–10.5)
nRBC: 0 % (ref 0.0–0.2)

## 2020-05-12 LAB — COMPREHENSIVE METABOLIC PANEL
ALT: 12 U/L (ref 0–44)
AST: 20 U/L (ref 15–41)
Albumin: 3.8 g/dL (ref 3.5–5.0)
Alkaline Phosphatase: 75 U/L (ref 38–126)
Anion gap: 9 (ref 5–15)
BUN: 28 mg/dL — ABNORMAL HIGH (ref 8–23)
CO2: 22 mmol/L (ref 22–32)
Calcium: 9.1 mg/dL (ref 8.9–10.3)
Chloride: 102 mmol/L (ref 98–111)
Creatinine, Ser: 1.25 mg/dL — ABNORMAL HIGH (ref 0.44–1.00)
GFR, Estimated: 42 mL/min — ABNORMAL LOW (ref >60)
Glucose, Bld: 92 mg/dL (ref 70–99)
Potassium: 3.4 mmol/L — ABNORMAL LOW (ref 3.5–5.1)
Sodium: 133 mmol/L — ABNORMAL LOW (ref 135–145)
Total Bilirubin: 0.9 mg/dL (ref 0.3–1.2)
Total Protein: 6.5 g/dL (ref 6.5–8.1)

## 2020-05-12 LAB — RESP PANEL BY RT-PCR (FLU A&B, COVID) ARPGX2
Influenza A by PCR: NEGATIVE
Influenza B by PCR: NEGATIVE
SARS Coronavirus 2 by RT PCR: NEGATIVE

## 2020-05-12 LAB — BRAIN NATRIURETIC PEPTIDE: B Natriuretic Peptide: 119.6 pg/mL — ABNORMAL HIGH (ref 0.0–100.0)

## 2020-05-12 LAB — TROPONIN I (HIGH SENSITIVITY): Troponin I (High Sensitivity): 13 ng/L (ref <18)

## 2020-05-12 MED ORDER — FUROSEMIDE 10 MG/ML IJ SOLN
20.0000 mg | Freq: Once | INTRAMUSCULAR | Status: AC
  Administered 2020-05-13: 20 mg via INTRAVENOUS
  Filled 2020-05-12: qty 2

## 2020-05-12 MED ORDER — LORAZEPAM 2 MG/ML IJ SOLN
0.5000 mg | Freq: Once | INTRAMUSCULAR | Status: AC
  Administered 2020-05-12: 0.5 mg via INTRAVENOUS
  Filled 2020-05-12: qty 1

## 2020-05-12 MED ORDER — TORSEMIDE 20 MG PO TABS: 20.0000 mg | ORAL_TABLET | Freq: Every day | ORAL | 0 refills | Status: DC

## 2020-05-12 MED ORDER — IOHEXOL 350 MG/ML SOLN
57.0000 mL | Freq: Once | INTRAVENOUS | Status: AC | PRN
  Administered 2020-05-12: 57 mL via INTRAVENOUS

## 2020-05-12 NOTE — ED Notes (Signed)
Pt transported to CT ?

## 2020-05-12 NOTE — ED Triage Notes (Signed)
Pt now endorsing chest tightness onset today w/ congested productive cough. EMS reported pt was recently dx w/ PNA.

## 2020-05-12 NOTE — ED Provider Notes (Signed)
11:16 PM Assumed care from Dr. Ronnald Nian, please see their note for full history, physical and decision making until this point. In brief this is a 87 y.o. year old female who presented to the ED tonight with Shortness of Breath and Chest Pain     Pending CTA, needs treatment for [facility] if that is reassuring.   CTA ok. Stable for dc with previous plan.   Discharge instructions, including strict return precautions for new or worsening symptoms, given. Patient and/or family verbalized understanding and agreement with the plan as described.   Labs, studies and imaging reviewed by myself and considered in medical decision making if ordered. Imaging interpreted by radiology.  Labs Reviewed  CBC WITH DIFFERENTIAL/PLATELET - Abnormal; Notable for the following components:      Result Value   RBC 3.36 (*)    Hemoglobin 10.6 (*)    HCT 34.0 (*)    MCV 101.2 (*)    All other components within normal limits  COMPREHENSIVE METABOLIC PANEL - Abnormal; Notable for the following components:   Sodium 133 (*)    Potassium 3.4 (*)    BUN 28 (*)    Creatinine, Ser 1.25 (*)    GFR, Estimated 42 (*)    All other components within normal limits  BRAIN NATRIURETIC PEPTIDE - Abnormal; Notable for the following components:   B Natriuretic Peptide 119.6 (*)    All other components within normal limits  RESP PANEL BY RT-PCR (FLU A&B, COVID) [id number]  TROPONIN I (HIGH SENSITIVITY)  TROPONIN I (HIGH SENSITIVITY)    DG Chest Portable 1 View  Final Result    CT Angio Chest PE W and/or Wo Contrast    (Results Pending)    No follow-ups on file.    Merrily Pew, MD 05/13/20 [phone]

## 2020-05-12 NOTE — ED Provider Notes (Signed)
[facility] EMERGENCY DEPARTMENT Provider Note   CSN: [id number] Arrival date & time: 05/12/20  1751     History Chief Complaint  Patient presents with  . Shortness of Breath  . Chest Pain    Christine Vaughan is a 87 y.o. female.  The history is provided by the patient.  Shortness of Breath Severity:  Mild Onset quality:  Gradual Timing:  Constant Progression:  Worsening Chronicity:  New Context: activity   Relieved by:  Rest Worsened by:  Exertion Associated symptoms: chest pain   Associated symptoms: no abdominal pain, no claudication, no cough, no diaphoresis, no ear pain, no fever, no hemoptysis, no rash, no sore throat, no sputum production and no vomiting   Risk factors: hx of PE/DVT        Past Medical History:  Diagnosis Date  . Anxiety   . Breast cancer, right ([place]) 2001   Right Lumpectomy, chemo + rad tx's.   . Chronic kidney disease   . Hereditary and idiopathic peripheral neuropathy 12/27/2015  . Hypertension   . MAI (mycobacterium avium-intracellulare) ([place])   . Thyroid disease     Patient Active Problem List   Diagnosis Date Noted  . Orthostatic hypotension 06/10/2019  . TIA (transient ischemic attack) 06/09/2019  . Hyponatremia 06/09/2019  . Hypokalemia 06/09/2019  . Chronic deep vein thrombosis (DVT) of right lower extremity ([place]) 03/25/2018  . Pulmonary embolus ([place]) 03/25/2018  . Use of cane as ambulatory aid 01/15/2018  . MAI (mycobacterium avium-intracellulare) infection ([place]) 06/16/2017  . Headache syndrome 12/27/2015  . Memory change 12/27/2015  . Vitamin B12 deficiency 12/27/2015  . Hereditary and idiopathic peripheral neuropathy 12/27/2015  . Abnormality of gait 12/27/2015  . Hypertension   . Health care maintenance 10/12/2014  . Mixed hyperlipidemia 10/07/2013  . Trigeminal neuralgia 09/19/2013  . Panic anxiety syndrome 09/19/2013  . Osteopenia 09/19/2013  . Hypothyroid 09/19/2013  . 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 Depression, major, in remission ([place]) 09/19/2013  . Bronchiectasis ([place]) 09/19/2013  . History of radiation therapy 07/15/2011  . Breast cancer ([place]) 07/15/2011  . 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 Optic neuropathy 12/31/2010  . Nuclear cataract 12/31/2010  . Glaucoma suspect 12/31/2010    Past Surgical History:  Procedure Laterality Date  . BACK SURGERY    . BREAST BIOPSY Left    surgical bx pt dose not remember  . BREAST EXCISIONAL BIOPSY Right 2001   + chem rad and armidex  . BREAST LUMPECTOMY  2001  . BREAST SURGERY    . CHOLECYSTECTOMY    . ESOPHAGOGASTRODUODENOSCOPY (EGD) WITH PROPOFOL N/A 09/20/2015   Procedure: ESOPHAGOGASTRODUODENOSCOPY (EGD) WITH PROPOFOL;  Surgeon: Lollie Sails, MD;  Location: [facility] ENDOSCOPY;  Service: Endoscopy;  Laterality: N/A;     OB History   No obstetric history on file.     Family History  Problem Relation Age of Onset  . Depression Mother   . Brain cancer Mother   . Breast cancer Paternal Aunt     Social History   Tobacco Use  . Smoking status: Never Smoker  . Smokeless tobacco: Never Used  Vaping Use  . Vaping Use: Never used  Substance Use Topics  . Alcohol use: No  . Drug use: No    Home Medications Prior to Admission medications   Medication Sig Start Date End Date Taking? Authorizing Provider  torsemide (DEMADEX) 20 MG tablet Take 1 tablet (20 mg total) by mouth  daily for 5 days. 05/12/20 05/17/20 Yes [place], Adam, DO  acetaminophen (TYLENOL) 500 MG tablet Take 250 mg by mouth daily as needed.     [provider]  albuterol (PROVENTIL) (5 MG/ML) 0.5% nebulizer solution Take 2.5 mg by nebulization every 6 (six) hours as needed for wheezing or shortness of breath.    [provider]  aspirin EC 81 MG tablet Take 1 tablet (81 mg total) by mouth daily. 06/10/19 06/09/20  Jennye Boroughs, MD  atorvastatin (LIPITOR) 40 MG tablet Take 1  tablet (40 mg total) by mouth at bedtime. 06/11/19   Jennye Boroughs, MD  azelastine (ASTELIN) 0.1 % nasal spray Place into the nose 2 (two) times daily. Use in each nostril as directed    [provider]  dicyclomine (BENTYL) 20 MG tablet Take 20 mg by mouth every 6 (six) hours.    [provider]  diphenhydramine-acetaminophen (TYLENOL PM) 25-500 MG TABS tablet Take 1 tablet by mouth at bedtime as needed.     [provider]  docusate sodium (COLACE) 100 MG capsule Take 1 capsule by mouth daily as needed.    [provider]  esomeprazole (NEXIUM) 40 MG capsule TAKE 1 CAPSULE BY MOUTH TWICE DAILY. TAKE 30 MINUTES BEFORE MEALS. 12/24/16   [provider]  gabapentin (NEURONTIN) 100 MG capsule Take 2 capsules (200 mg total) by mouth at bedtime. 08/31/18   Rainey Pines, MD  gentamicin cream (GARAMYCIN) 0.1 % Apply 1 application topically 2 (two) times daily. 07/28/18   Edrick Kins, DPM  ipratropium (ATROVENT) 0.06 % nasal spray Place 2 sprays into the nose 2 (two) times daily as needed. 03/19/18 06/09/19  [provider]  levofloxacin (LEVAQUIN) 250 MG tablet Take 250 mg by mouth daily. 06/02/19   [provider]  levothyroxine (SYNTHROID, LEVOTHROID) 50 MCG tablet Take 50 mcg by mouth daily before breakfast.    [provider]  LORazepam (ATIVAN) 0.5 MG tablet Take 0.25 mg by mouth 2 (two) times daily as needed. 06/02/19   [provider]  meclizine (ANTIVERT) 12.5 MG tablet Take 12.5 mg by mouth 3 (three) times daily as needed. 01/26/19   [provider]  ondansetron (ZOFRAN-ODT) 4 MG disintegrating tablet Take 4 mg by mouth every 8 (eight) hours as needed. 03/22/19   [provider]  sertraline (ZOLOFT) 25 MG tablet Take 1 tablet (25 mg total) by mouth daily. 11/09/18   Rainey Pines, MD  tamsulosin (FLOMAX) 0.4 MG CAPS capsule TAKE 1 CAPSULE EVERY DAY 07/14/19   Vaillancourt, Samantha, PA-C  tobramycin, PF, (TOBI)  300 MG/5ML nebulizer solution Take 300 mg by nebulization 2 (two) times daily.    [provider]  traMADol (ULTRAM) 50 MG tablet Take 1 tablet (50 mg total) by mouth every 6 (six) hours as needed. 03/12/18   Carrie Mew, MD  traZODone (DESYREL) 50 MG tablet Take 50 mg by mouth at bedtime as needed. 04/30/19   [provider]    Allergies    Ciprofloxacin, Elemental sulfur, Erythromycin, Metronidazole, Tizanidine, Penicillins, and Sulfamethoxazole  Review of Systems   Review of Systems  Constitutional: Negative for chills, diaphoresis and fever.  HENT: Negative for ear pain and sore throat.   Eyes: Negative for pain and visual disturbance.  Respiratory: Positive for shortness of breath. Negative for cough, hemoptysis and sputum production.   Cardiovascular: Positive for chest pain and leg swelling. Negative for palpitations and claudication.  Gastrointestinal: Negative for abdominal pain and vomiting.  Genitourinary:  Negative for dysuria and hematuria.  Musculoskeletal: Negative for arthralgias and back pain.  Skin: Negative for color change and rash.  Neurological: Negative for seizures and syncope.  All other systems reviewed and are negative.   Physical Exam Updated Vital Signs  ED Triage Vitals  Enc Vitals Group     BP 05/12/20 1804 (!) 160/89     Pulse Rate 05/12/20 1804 86     Resp 05/12/20 1804 (!) 21     Temp 05/12/20 1804 98.8 F (37.1 C)     Temp Source 05/12/20 1804 Oral     SpO2 05/12/20 1757 99 %     Weight 05/12/20 1811 127 lb (57.6 kg)     Height 05/12/20 1811 5\' 4"  (1.626 m)     Head Circumference --      Peak Flow --      Pain Score 05/12/20 1807 8     Pain Loc --      Pain Edu? --      Excl. in [place]? --     Physical Exam Vitals and nursing note reviewed.  Constitutional:      General: She is not in acute distress.    Appearance: She is well-developed. She is not ill-appearing.  HENT:     Head: Normocephalic and atraumatic.   Eyes:     Conjunctiva/sclera: Conjunctivae normal.     Pupils: Pupils are equal, round, and reactive to light.  Cardiovascular:     Rate and Rhythm: Normal rate and regular rhythm.     Pulses: Normal pulses.     Heart sounds: Normal heart sounds. No murmur heard.   Pulmonary:     Effort: Tachypnea present. No respiratory distress.     Breath sounds: Rhonchi present.  Abdominal:     Palpations: Abdomen is soft.     Tenderness: There is no abdominal tenderness.  Musculoskeletal:     Cervical back: Normal range of motion and neck supple.     Right lower leg: Edema present.     Left lower leg: Edema present.  Skin:    General: Skin is warm and dry.     Capillary Refill: Capillary refill takes less than 2 seconds.  Neurological:     General: No focal deficit present.     Mental Status: She is alert.  Psychiatric:        Mood and Affect: Mood normal.     ED Results / Procedures / Treatments   Labs (all labs ordered are listed, but only abnormal results are displayed) Labs Reviewed  CBC WITH DIFFERENTIAL/PLATELET - Abnormal; Notable for the following components:      Result Value   RBC 3.36 (*)    Hemoglobin 10.6 (*)    HCT 34.0 (*)    MCV 101.2 (*)    All other components within normal limits  COMPREHENSIVE METABOLIC PANEL - Abnormal; Notable for the following components:   Sodium 133 (*)    Potassium 3.4 (*)    BUN 28 (*)    Creatinine, Ser 1.25 (*)    GFR, Estimated 42 (*)    All other components within normal limits  BRAIN NATRIURETIC PEPTIDE - Abnormal; Notable for the following components:   B Natriuretic Peptide 119.6 (*)    All other components within normal limits  RESP PANEL BY RT-PCR (FLU A&B, COVID) [id number]  TROPONIN I (HIGH SENSITIVITY)  TROPONIN I (HIGH SENSITIVITY)    EKG EKG Interpretation  Date/Time:  Friday May 12 2020 18:06:37 EDT  Ventricular Rate:  84 PR Interval:    QRS Duration: 114 QT Interval:  380 QTC Calculation: 450 R  Axis:   -54 Text Interpretation: Sinus rhythm Left anterior fascicular block Abnormal R-wave progression, late transition LVH with secondary repolarization abnormality Confirmed by Lennice Sites [phone]) on 05/12/2020 6:22:06 PM   Radiology DG Chest Portable 1 View  Result Date: 05/12/2020 CLINICAL DATA:  Shortness of breath. EXAM: PORTABLE CHEST 1 VIEW COMPARISON:  October 13, 2019 FINDINGS: The heart, hila, and mediastinum are unremarkable. No pneumothorax. Mild hazy opacities in the lungs persist but have worsened since March 12, 2018. The findings also appear mildly worsened, particularly on the left, since April of 2021. The findings are similar since August of 2021. IMPRESSION: Bilateral pulmonary opacities, right greater than left, similar since August 2021. The findings have worsened since January 2020 and April 2021. A CT scan from August 2019 was consistent with atypical mycobacterial infection. The findings could represent worsening of the chronic in fact process. An acute on chronic infection is considered less likely given relative stability since August 2021. Electronically Signed   By: Dorise Bullion III M.D   On: 05/12/2020 19:10    Procedures Procedures   Medications Ordered in ED Medications  furosemide (LASIX) injection 20 mg (has no administration in time range)  LORazepam (ATIVAN) injection 0.5 mg (0.5 mg Intravenous Given 05/12/20 2204)  iohexol (OMNIPAQUE) 350 MG/ML injection 57 mL (57 mLs Intravenous Contrast Given 05/12/20 2324)    ED Course  I have reviewed the triage vital signs and the nursing notes.  Pertinent labs & imaging results that were available during my care of the patient were reviewed by me and considered in my medical decision making (see chart for details).    MDM Rules/Calculators/A&P                          Christine Vaughan is an 87 year old female with history of CKD, hypertension, possibly blood clots who presents the ED with shortness of  breath.  Having some chest pain as well.  Hypertensive but otherwise unremarkable vitals.  Slightly tachypneic.  EKG shows sinus rhythm.  No ischemic changes.  Rhonchi on exam with leg swelling.  Heart failure type symptoms with orthopnea, having to sit up to sleep using pillows.  States that she is not maybe even taking the torsemide she is supposed to take.  Uses inhalers at home.  Does not have any wheezing on exam.  Suspect volume overload/heart failure versus possibly ACS.  Less likely PE.  Will get blood work including chest x-ray and Covid test and reevaluate.  Checks x-ray overall is unremarkable.  Suspect some chronic bronchiectasis changes.  Will get a CT scan of the chest to rule out PE.  Troponin normal.  No significant anemia, electrolyte abnormality, kidney injury otherwise.  Overall suspect some very mild fluid overload.  BNP was mildly elevated.  She is on room air.  Normal vitals.  She states that she stopped taking her torsemide recently and will give her dose of IV Lasix and recommend that she continue her torsemide to have close follow-up with primary care doctor.  Patient is awaiting repeat troponin and CT scanning at time of handoff to Dr. Dayna Barker.  Please see his note for further results, evaluation, disposition of the patient.  This chart was dictated using voice recognition software.  Despite best efforts to proofread,  errors can occur which can change the documentation meaning.  Final Clinical Impression(s) / ED Diagnoses Final diagnoses:  Shortness of breath    Rx / DC Orders ED Discharge Orders         Ordered    torsemide (DEMADEX) 20 MG tablet  Daily        05/12/20 [place], Adam, DO 05/12/20 2330

## 2020-05-12 NOTE — Discharge Instructions (Addendum)
Suspect that your shortness of breath is secondary to your chronic lung disease but also from some fluid overload.  Please continue torsemide 20 mg daily.  Follow-up closely with your primary care doctor.  Please return if symptoms worsen.  Continue to use your inhaler.  I have sent torsemide prescription to your pharmacy.

## 2020-05-12 NOTE — ED Triage Notes (Signed)
Pt arrived to ED via EMS from [facility] w/ c/o shob. EMS reports pt was on 2L sating 98% and in NAD. EMS placed pt on 4L [place] for comfort and pt was sating 99%. Pt uses 2L O2 via [place] prn. VSS w/ EMS. Pt is hyperverbal upon arrival and speaking in complete sentences and in NAD. EMS VS: HR 80, RR 17, CBG 137, 99% 4L, 135/76

## 2020-05-12 NOTE — ED Notes (Signed)
Unable to obtain labs via IV. IV team consult placed for a 20g for CTPA. DO aware

## 2020-05-13 LAB — TROPONIN I (HIGH SENSITIVITY): Troponin I (High Sensitivity): 18 ng/L — ABNORMAL HIGH (ref <18)

## 2020-05-13 MED ORDER — TORSEMIDE 20 MG PO TABS: 20.0000 mg | ORAL_TABLET | Freq: Every day | ORAL | Status: DC

## 2020-05-13 NOTE — ED Notes (Signed)
[facility] called  

## 2020-07-10 ENCOUNTER — Other Ambulatory Visit: Payer: Self-pay | Admitting: Family Medicine

## 2020-07-10 ENCOUNTER — Other Ambulatory Visit (HOSPITAL_COMMUNITY): Payer: Self-pay | Admitting: Family Medicine

## 2020-07-10 DIAGNOSIS — R1310 Dysphagia, unspecified: Secondary | ICD-10-CM

## 2020-07-19 ENCOUNTER — Ambulatory Visit (HOSPITAL_COMMUNITY)
Admission: RE | Admit: 2020-07-19 | Discharge: 2020-07-19 | Disposition: A | Discharge status: Home / Self Care | Payer: Medicare PPO | Source: Ambulatory Visit | Attending: Family Medicine | Admitting: Family Medicine

## 2020-07-19 ENCOUNTER — Other Ambulatory Visit (HOSPITAL_COMMUNITY): Payer: Self-pay | Admitting: Family Medicine

## 2020-07-19 ENCOUNTER — Other Ambulatory Visit: Payer: Self-pay

## 2020-07-19 DIAGNOSIS — R1310 Dysphagia, unspecified: Secondary | ICD-10-CM

## 2020-08-31 ENCOUNTER — Telehealth (HOSPITAL_COMMUNITY): Payer: Self-pay

## 2020-09-05 ENCOUNTER — Other Ambulatory Visit (HOSPITAL_COMMUNITY): Payer: Self-pay | Admitting: *Deleted

## 2020-09-05 DIAGNOSIS — R1310 Dysphagia, unspecified: Secondary | ICD-10-CM

## 2020-09-12 ENCOUNTER — Ambulatory Visit (HOSPITAL_COMMUNITY)
Admission: RE | Admit: 2020-09-12 | Discharge: 2020-09-12 | Disposition: A | Discharge status: Home / Self Care | Payer: Medicare PPO | Source: Ambulatory Visit | Attending: Family Medicine | Admitting: Family Medicine

## 2020-09-12 ENCOUNTER — Encounter (HOSPITAL_COMMUNITY): Payer: Self-pay

## 2020-09-12 ENCOUNTER — Telehealth (HOSPITAL_COMMUNITY): Payer: Self-pay

## 2020-09-12 ENCOUNTER — Other Ambulatory Visit: Payer: Self-pay

## 2020-09-12 DIAGNOSIS — R1310 Dysphagia, unspecified: Secondary | ICD-10-CM

## 2020-09-12 NOTE — Telephone Encounter (Signed)
Pt arrived for [facility], her daughter Manuela Schwartz was present.  Pt denies dysphagia and advised she did not want an evaluation completed.  SLP reviewed pt's chart, medical records from [facility], [place],  including indications for testing, prior chest imaging results, etc.  Pt admits to coughing with water at times and aspirating water when taking Synthroid in March 2022.  Prior UGI 2019 showed silent aspiration and GERD - to which SLP made pt aware.  Pt and daughter agreeable to pursue MBS after explanation, but does not wish to proceed today.  She advised she would call to reschedule. SLP provided the office number in writing for her to reschedule.   Thank you.   Luanna Salk [place] [facility] SLP [phone] pager

## 2020-09-13 ENCOUNTER — Telehealth (HOSPITAL_COMMUNITY): Payer: Self-pay

## 2020-09-13 NOTE — Telephone Encounter (Signed)
Called and spoke with daughter of the patient to reschedule OP MBS- she will call back after patient has eye surgery on Friday 7/22.

## 2020-09-25 IMAGING — CT CT ANGIO CHEST
2 of 7 series · 13 of 36 positions shown · IV contrast (iopamidol)
Comparison: 10/21/2017

Addendum:
CLINICAL DATA: Shortness of breath and positive D-dimer. Known OYUKI.

EXAM:
CT ANGIOGRAPHY CHEST WITH CONTRAST
TECHNIQUE: Multidetector CT imaging of the chest was performed using the
standard protocol during bolus administration of intravenous
contrast. Multiplanar CT image reconstructions and MIPs were
obtained to evaluate the vascular anatomy.
CONTRAST:  65mL KD1X1I-0IT IOPAMIDOL (KD1X1I-0IT) INJECTION 76%

[Series 6: thins cta pulmonary · axial · 0.57mm/px · z∈[-1217,-975]mm · 12 of 288 slices shown]
[im 23/288  lung]
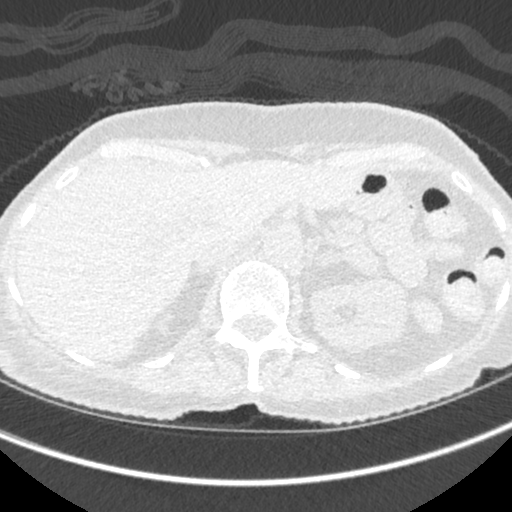
[im 45/288  mediastinal]
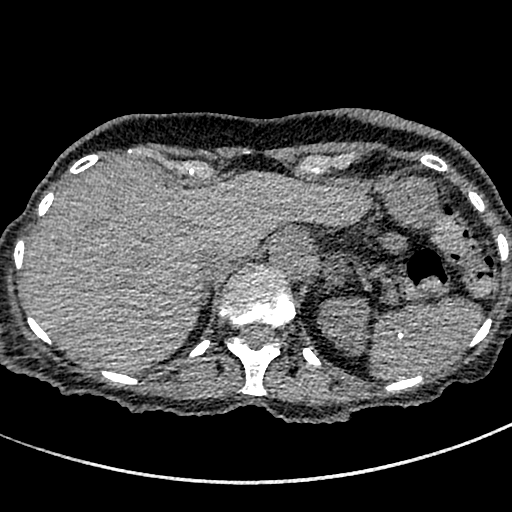
[im 67/288  lung]
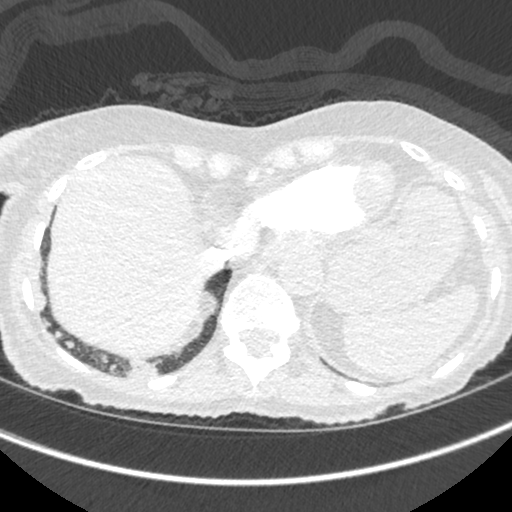
[im 89/288  mediastinal]
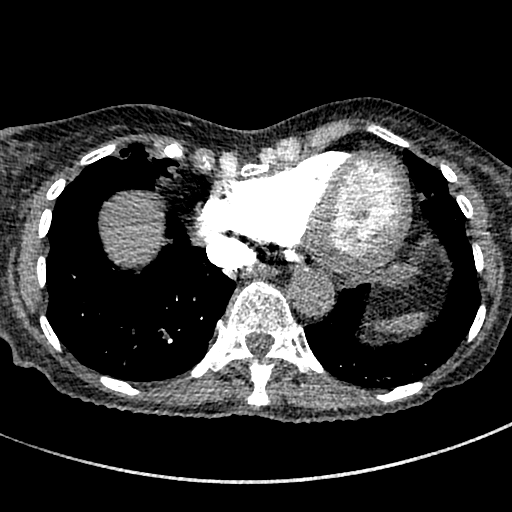
[im 111/288  lung]
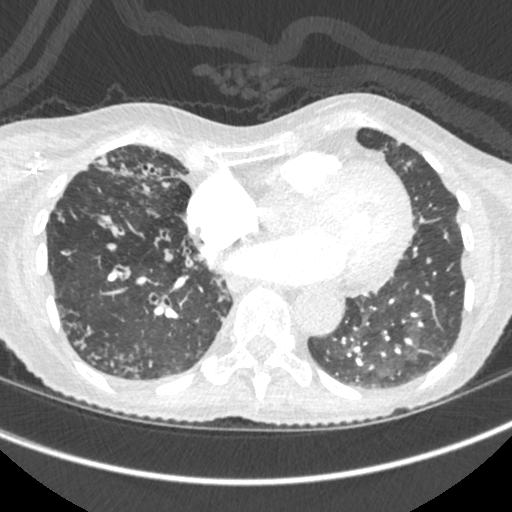
[im 133/288  mediastinal]
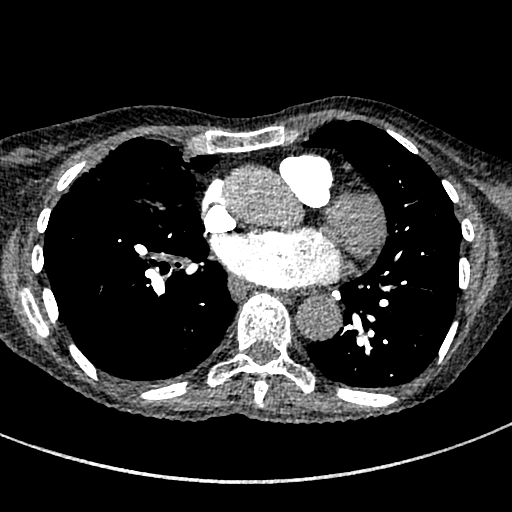
[im 155/288  lung]
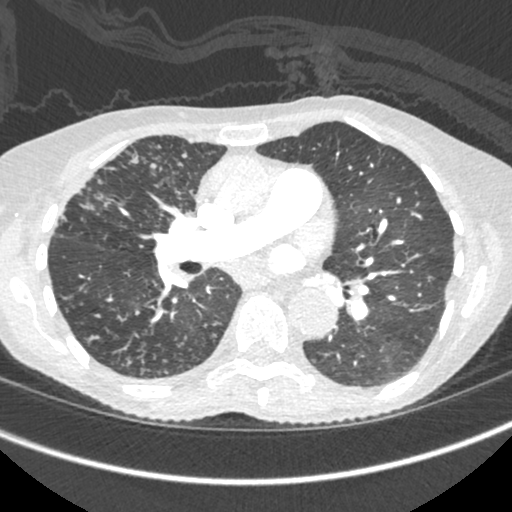
[im 177/288  mediastinal]
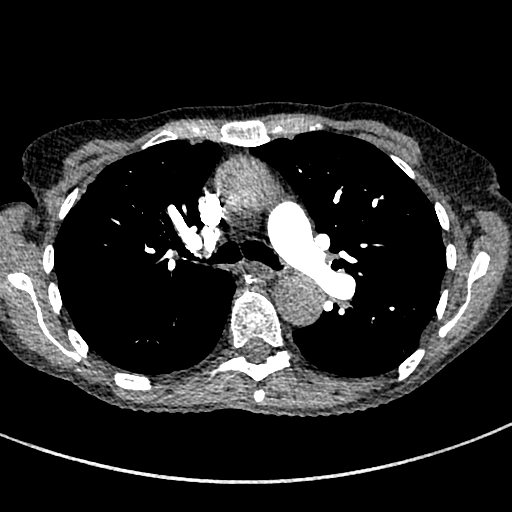
[im 199/288  lung]
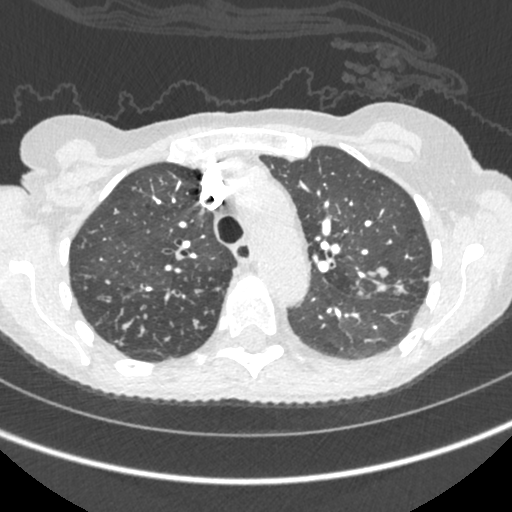
[im 221/288  mediastinal]
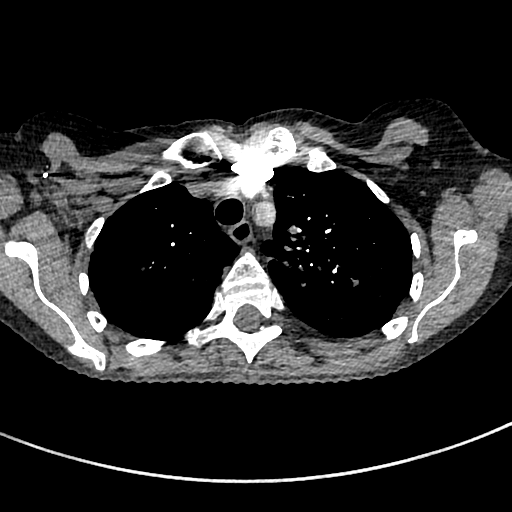
[im 243/288  lung]
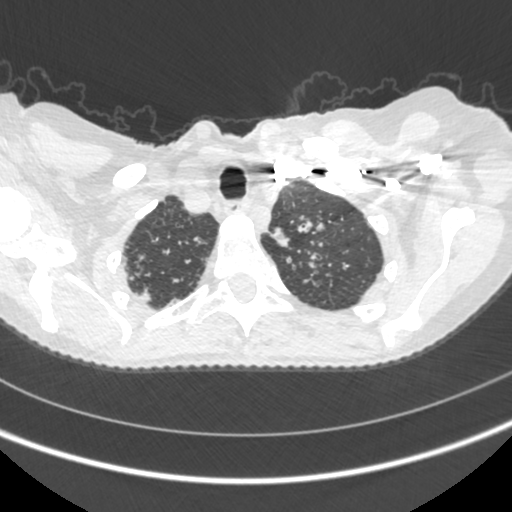
[im 265/288  mediastinal]
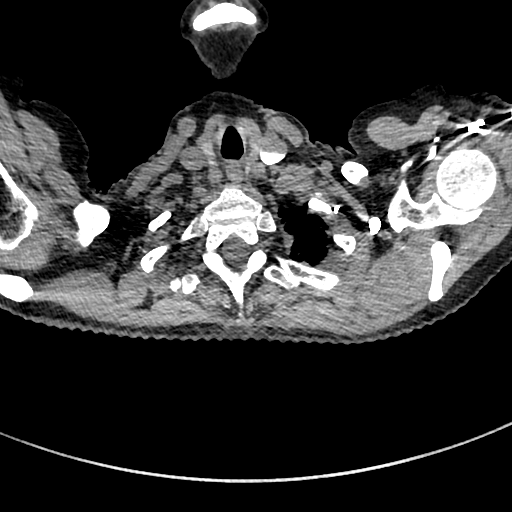

[Series 9: cta pulmonary · coronal · 0.56mm/px · 1 of 96 slices shown]
[im 48/96  mediastinal]
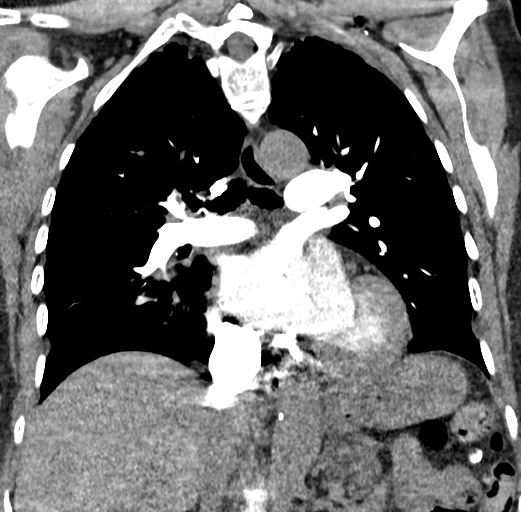

[13 of 36 positions shown; findings below may reference images not displayed]

FINDINGS: Cardiovascular: Mild cardiomegaly. Minimal calcified plaque over the
left anterior descending coronary artery. Mild calcified plaque
involving the aorta. There are a few small segmental and
subsegmental emboli over the right middle and lower lobar arteries.
Left pulmonary arteries are normal.

Mediastinum/Nodes: No mediastinal or hilar adenopathy. Few small
calcified mediastinal and left hilar lymph nodes. Remaining
mediastinal structures are unremarkable.

Lungs/Pleura: Lungs are adequately inflated demonstrate persistent
diffuse bilateral nodular/tree-in-bud pattern of opacification
compatible with known OYUKI. There is minimal overall worsening. Few
small calcified left-sided granulomas. Bronchiectatic change over
the right middle lobe and lingula as well as lung apices. No
effusion.

Upper Abdomen: No acute findings.

Musculoskeletal: Surgical clips over the breast bilaterally. Minimal
degenerate change of the spine.

Review of the MIP images confirms the above findings.
IMPRESSION: Few small acute segmental and subsegmental emboli over the right
middle and lower lobar pulmonary arteries.

Known diffuse bilateral patchy nodular/tree-in-bud pattern of
opacification compatible with known OYUKI infection with slight
overall interval worsening. Stable bronchiectasis over the right
middle lobe and lingula as well as lung apices.

Mild cardiomegaly.

ADDENDUM:
Critical Value/emergent results were called by telephone at the time
of interpretation on 02/06/2018 at [DATE] to Dr. GUDMUNDI MAGNASON ,
who verbally acknowledged these results. I spoke with the CT
technologist at [facility] who is directing patient to proceed to Dr.
[REDACTED] at this time.

*** End of Addendum ***

## 2020-10-05 ENCOUNTER — Telehealth (HOSPITAL_COMMUNITY): Payer: Self-pay

## 2020-10-05 NOTE — Telephone Encounter (Signed)
2nd attempt to contact daughter of patient to reschedule OP MBS - left voicemail.

## 2020-10-17 ENCOUNTER — Ambulatory Visit (HOSPITAL_COMMUNITY)
Admission: RE | Admit: 2020-10-17 | Discharge: 2020-10-17 | Disposition: A | Discharge status: Home / Self Care | Payer: Medicare PPO | Source: Ambulatory Visit | Attending: Family Medicine | Admitting: Family Medicine

## 2020-10-17 ENCOUNTER — Other Ambulatory Visit: Payer: Self-pay

## 2020-10-17 DIAGNOSIS — R1310 Dysphagia, unspecified: Secondary | ICD-10-CM | POA: Insufficient documentation

## 2020-10-17 NOTE — Progress Notes (Signed)
Modified Barium Swallow Progress Note  Patient Details  Name: Christine Vaughan MRN: [id number] Date of Birth: Aug 18, 1931  Today's Date: 10/17/2020  Modified Barium Swallow completed.  Full report located under Chart Review in the Imaging Section.  Brief recommendations include the following:  Clinical Impression  Pt presents with a moderate pharyngeal dysphagia, with oral phase relatively functional although marked by compensatory strategies that the pt implements with Mod I. She has reduced hyolaryngeal movement, base of tongue retraction and pharyngeal squeeze with resultant decrease in epiglottic inversion and UES opening. She has significant pharyngeal residue in her valleculae and pyriform sinuses, increasing as consistencies become thicker. She manages this by bringing it back into her mouth and swallowing again. She takes very small bites/sips and uses piecemeal swallows at times, but does so volitionally. Pt has fairly consistent and deep laryngeal penetration from the thin liquid residue, some of which starts to move out of the laryngeal vestibule as she repeats her swallows. A single instance of trace aspiration occurred when trialing a chin tuck maneuver, during which the thin liquid dipped just below the vocal folds. What doesn't clear spontaneously is cleared easily when she is cued to use a throat clear. At that time not only does she clear her laryngeal vestibule, but she also clears any remaining residue in her pharynx (often orally expectorating it). Even wtih attempts at challenging her with larger volumes or faster rates, pt does not have any additional aspiration during this study. Recommend continuing with soft solids and thin liquids as tolerated but would also implement an intermittent throat clear to try to facilitate airway protection. Would also continue SLP f/u with primary SLP at [facility].   Swallow Evaluation Recommendations       SLP Diet Recommendations:  Dysphagia 3 (Mech soft) solids;Thin liquid   Liquid Administration via: Cup;Straw   Medication Administration: Crushed with puree   Supervision: Patient able to self feed;Intermittent supervision to cue for compensatory strategies   Compensations: Slow rate;Small sips/bites;Multiple dry swallows after each bite/sip;Clear throat intermittently   Postural Changes: Seated upright at 90 degrees;Remain semi-upright after after feeds/meals (Comment)   Oral Care Recommendations: Oral care BID        Osie Bond., M.A. [place] Pager [phone] Office [phone]  10/17/2020,1:45 PM

## 2020-11-04 ENCOUNTER — Telehealth: Payer: Medicare PPO | Admitting: Nurse Practitioner

## 2020-11-04 DIAGNOSIS — N3000 Acute cystitis without hematuria: Secondary | ICD-10-CM | POA: Diagnosis not present

## 2020-11-04 MED ORDER — DOXYCYCLINE HYCLATE 100 MG PO TABS: 100.0000 mg | ORAL_TABLET | Freq: Two times a day (BID) | ORAL | 0 refills | Status: DC

## 2020-11-04 NOTE — Progress Notes (Signed)
Virtual Visit Consent   Christine Vaughan, you are scheduled for a virtual visit with Mary-Margaret Hassell Done, [place], a [facility] provider, today.     Just as with appointments in the office, your consent must be obtained to participate.  Your consent will be active for this visit and any virtual visit you may have with one of our providers in the next 365 days.     If you have a MyChart account, a copy of this consent can be sent to you electronically.  All virtual visits are billed to your insurance company just like a traditional visit in the office.    As this is a virtual visit, video technology does not allow for your provider to perform a traditional examination.  This may limit your provider's ability to fully assess your condition.  If your provider identifies any concerns that need to be evaluated in person or the need to arrange testing (such as labs, EKG, etc.), we will make arrangements to do so.     Although advances in technology are sophisticated, we cannot ensure that it will always work on either your end or our end.  If the connection with a video visit is poor, the visit may have to be switched to a telephone visit.  With either a video or telephone visit, we are not always able to ensure that we have a secure connection.     I need to obtain your verbal consent now.   Are you willing to proceed with your visit today? YES   Christine Vaughan has provided verbal consent on 11/04/2020 for a virtual visit (video or telephone).   Mary-Margaret Hassell Done, FNP   Date: 11/04/2020 3:58 PM   Virtual Visit via Video Note   I, Mary-Margaret Hassell Done, connected with Christine Vaughan ([id number], 05/18/1981) on 11/04/20 at  4:30 PM EDT by a video-enabled telemedicine application and verified that I am speaking with the correct person using two identifiers.  Location: Patient: Virtual Visit Location Patient: Home Provider: Virtual Visit Location Provider: Mobile   I discussed the  limitations of evaluation and management by telemedicine and the availability of in person appointments. The patient expressed understanding and agreed to proceed.    History of Present Illness: Christine Vaughan is a 87 y.o. who identifies as a female who was assigned female at birth, and is being seen today for uti .  HPI: Patient does video with her daughter present. She developed some fatigue and urinary frequency. Has continued since then having nocturia 4-5x at night. Did home urinary test and was positive for nitrites.   Review of Systems  Constitutional:  Negative for chills and fever.  Genitourinary:  Positive for dysuria, frequency and urgency. Negative for flank pain and hematuria.  Musculoskeletal:  Positive for back pain.   Problems:  Patient Active Problem List   Diagnosis Date Noted   Orthostatic hypotension 06/10/2019   TIA (transient ischemic attack) 06/09/2019   Hyponatremia 06/09/2019   Hypokalemia 06/09/2019   Chronic deep vein thrombosis (DVT) of right lower extremity ([place]) 03/25/2018   Pulmonary embolus ([place]) 03/25/2018   Use of cane as ambulatory aid 01/15/2018   MAI (mycobacterium avium-intracellulare) infection ([place]) 06/16/2017   Headache syndrome 12/27/2015   Memory change 12/27/2015   Vitamin B12 deficiency 12/27/2015   Hereditary and idiopathic peripheral neuropathy 12/27/2015   Abnormality of gait 12/27/2015   Hypertension    Health care maintenance 10/12/2014   Mixed hyperlipidemia 10/07/2013   Trigeminal neuralgia  09/19/2013   Panic anxiety syndrome 09/19/2013   Osteopenia 09/19/2013   Hypothyroid 09/19/2013   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 09/19/2013   Depression, major, in remission ([place]) 09/19/2013   Bronchiectasis ([place]) 09/19/2013   History of radiation therapy 07/15/2011   Breast cancer ([place]) 07/15/2011   Scotoma involving central area of left eye 12/31/2010   Optic neuropathy 12/31/2010   Nuclear cataract 12/31/2010    Glaucoma suspect 12/31/2010    Allergies:  Allergies  Allergen Reactions   Ciprofloxacin Other (See Comments)    aggravates her neuropathy   Elemental Sulfur Other (See Comments)    Doesn't remember   Erythromycin Other (See Comments)    Gi distress   Metronidazole     Other reaction(s): Abdominal Pain   Tizanidine Other (See Comments)    GI upset   Penicillins Rash    Has patient had a PCN reaction causing immediate rash, facial/tongue/throat swelling, SOB or lightheadedness with hypotension: No Has patient had a PCN reaction causing severe rash involving mucus membranes or skin necrosis: No Has patient had a PCN reaction that required hospitalization No Has patient had a PCN reaction occurring within the last 10 years: no If all of the above answers are "NO", then may proceed with Cephalosporin use.   Sulfamethoxazole Rash    all over body   Medications:  Current Outpatient Medications:    acetaminophen (TYLENOL) 500 MG tablet, Take 250 mg by mouth daily as needed. , Disp: , Rfl:    albuterol (PROVENTIL) (5 MG/ML) 0.5% nebulizer solution, Take 2.5 mg by nebulization every 6 (six) hours as needed for wheezing or shortness of breath., Disp: , Rfl:    atorvastatin (LIPITOR) 40 MG tablet, Take 1 tablet (40 mg total) by mouth at bedtime., Disp: 30 tablet, Rfl: 0   azelastine (ASTELIN) 0.1 % nasal spray, Place into the nose 2 (two) times daily. Use in each nostril as directed, Disp: , Rfl:    dicyclomine (BENTYL) 20 MG tablet, Take 20 mg by mouth every 6 (six) hours., Disp: , Rfl:    diphenhydramine-acetaminophen (TYLENOL PM) 25-500 MG TABS tablet, Take 1 tablet by mouth at bedtime as needed. , Disp: , Rfl:    docusate sodium (COLACE) 100 MG capsule, Take 1 capsule by mouth daily as needed., Disp: , Rfl:    esomeprazole (NEXIUM) 40 MG capsule, TAKE 1 CAPSULE BY MOUTH TWICE DAILY. TAKE 30 MINUTES BEFORE MEALS., Disp: , Rfl:    gabapentin (NEURONTIN) 100 MG capsule, Take 2 capsules (200  mg total) by mouth at bedtime., Disp: 60 capsule, Rfl: 6   gentamicin cream (GARAMYCIN) 0.1 %, Apply 1 application topically 2 (two) times daily., Disp: 15 g, Rfl: 1   ipratropium (ATROVENT) 0.06 % nasal spray, Place 2 sprays into the nose 2 (two) times daily as needed., Disp: , Rfl:    levofloxacin (LEVAQUIN) 250 MG tablet, Take 250 mg by mouth daily., Disp: , Rfl:    levothyroxine (SYNTHROID, LEVOTHROID) 50 MCG tablet, Take 50 mcg by mouth daily before breakfast., Disp: , Rfl:    LORazepam (ATIVAN) 0.5 MG tablet, Take 0.25 mg by mouth 2 (two) times daily as needed., Disp: , Rfl:    meclizine (ANTIVERT) 12.5 MG tablet, Take 12.5 mg by mouth 3 (three) times daily as needed., Disp: , Rfl:    ondansetron (ZOFRAN-ODT) 4 MG disintegrating tablet, Take 4 mg by mouth every 8 (eight) hours as needed., Disp: , Rfl:    sertraline (ZOLOFT) 25 MG tablet, Take 1  tablet (25 mg total) by mouth daily., Disp: 30 tablet, Rfl: 3   tamsulosin (FLOMAX) 0.4 MG CAPS capsule, TAKE 1 CAPSULE EVERY DAY, Disp: 30 capsule, Rfl: 2   tobramycin, PF, (TOBI) 300 MG/5ML nebulizer solution, Take 300 mg by nebulization 2 (two) times daily., Disp: , Rfl:    torsemide (DEMADEX) 20 MG tablet, Take 1 tablet (20 mg total) by mouth daily for 5 days., Disp: 5 tablet, Rfl: 0   traMADol (ULTRAM) 50 MG tablet, Take 1 tablet (50 mg total) by mouth every 6 (six) hours as needed., Disp: 12 tablet, Rfl: 0   traZODone (DESYREL) 50 MG tablet, Take 50 mg by mouth at bedtime as needed., Disp: , Rfl:   Observations/Objective: Patient is well-developed, well-nourished in no acute distress.  Resting comfortably  at home.  Head is normocephalic, atraumatic.  No labored breathing.  Speech is clear and coherent with logical content.  Patient is alert and oriented at baseline.    Assessment and Plan:  Christine Vaughan in today with chief complaint of Urinary Tract Infection   1. Acute cystitis without hematuria Take medication as  prescribe Cotton underwear Take shower not bath Cranberry juice, yogurt Force fluids AZO over the counter X2 days RTO prn  - doxycycline (VIBRA-TABS) 100 MG tablet; Take 1 tablet (100 mg total) by mouth 2 (two) times daily. 1 po bid  Dispense: 20 tablet; Refill: 0     Follow Up Instructions: I discussed the assessment and treatment plan with the patient. The patient was provided an opportunity to ask questions and all were answered. The patient agreed with the plan and demonstrated an understanding of the instructions.  A copy of instructions were sent to the patient via MyChart.  The patient was advised to call back or seek an in-person evaluation if the symptoms worsen or if the condition fails to improve as anticipated.  Time:  I spent 10 minutes with the patient via telehealth technology discussing the above problems/concerns.    Mary-Margaret Hassell Done, FNP

## 2020-12-01 ENCOUNTER — Emergency Department (HOSPITAL_COMMUNITY): Payer: Medicare PPO

## 2020-12-01 ENCOUNTER — Emergency Department (HOSPITAL_COMMUNITY)
Admission: EM | Admit: 2020-12-01 | Discharge: 2020-12-01 | Disposition: A | Discharge status: Home / Self Care | Payer: Medicare PPO | Attending: Emergency Medicine | Admitting: Emergency Medicine

## 2020-12-01 DIAGNOSIS — E039 Hypothyroidism, unspecified: Secondary | ICD-10-CM | POA: Insufficient documentation

## 2020-12-01 DIAGNOSIS — N189 Chronic kidney disease, unspecified: Secondary | ICD-10-CM | POA: Insufficient documentation

## 2020-12-01 DIAGNOSIS — R059 Cough, unspecified: Secondary | ICD-10-CM

## 2020-12-01 DIAGNOSIS — R042 Hemoptysis: Secondary | ICD-10-CM | POA: Insufficient documentation

## 2020-12-01 DIAGNOSIS — Z20822 Contact with and (suspected) exposure to covid-19: Secondary | ICD-10-CM | POA: Insufficient documentation

## 2020-12-01 DIAGNOSIS — Z79899 Other long term (current) drug therapy: Secondary | ICD-10-CM | POA: Insufficient documentation

## 2020-12-01 DIAGNOSIS — Z853 Personal history of malignant neoplasm of breast: Secondary | ICD-10-CM | POA: Insufficient documentation

## 2020-12-01 DIAGNOSIS — R053 Chronic cough: Secondary | ICD-10-CM | POA: Diagnosis not present

## 2020-12-01 DIAGNOSIS — I129 Hypertensive chronic kidney disease with stage 1 through stage 4 chronic kidney disease, or unspecified chronic kidney disease: Secondary | ICD-10-CM | POA: Insufficient documentation

## 2020-12-01 DIAGNOSIS — J40 Bronchitis, not specified as acute or chronic: Secondary | ICD-10-CM

## 2020-12-01 LAB — CBC
HCT: 32.8 % — ABNORMAL LOW (ref 36.0–46.0)
Hemoglobin: 10.4 g/dL — ABNORMAL LOW (ref 12.0–15.0)
MCH: 31 pg (ref 26.0–34.0)
MCHC: 31.7 g/dL (ref 30.0–36.0)
MCV: 97.6 fL (ref 80.0–100.0)
Platelets: 236 10*3/uL (ref 150–400)
RBC: 3.36 MIL/uL — ABNORMAL LOW (ref 3.87–5.11)
RDW: 15.1 % (ref 11.5–15.5)
WBC: 6.4 10*3/uL (ref 4.0–10.5)
nRBC: 0 % (ref 0.0–0.2)

## 2020-12-01 LAB — RESP PANEL BY RT-PCR (FLU A&B, COVID) ARPGX2
Influenza A by PCR: NEGATIVE
Influenza B by PCR: NEGATIVE
SARS Coronavirus 2 by RT PCR: NEGATIVE

## 2020-12-01 LAB — BASIC METABOLIC PANEL
Anion gap: 11 (ref 5–15)
BUN: 12 mg/dL (ref 8–23)
CO2: 23 mmol/L (ref 22–32)
Calcium: 9 mg/dL (ref 8.9–10.3)
Chloride: 97 mmol/L — ABNORMAL LOW (ref 98–111)
Creatinine, Ser: 0.86 mg/dL (ref 0.44–1.00)
GFR, Estimated: >60 mL/min (ref >60)
Glucose, Bld: 86 mg/dL (ref 70–99)
Potassium: 4.1 mmol/L (ref 3.5–5.1)
Sodium: 131 mmol/L — ABNORMAL LOW (ref 135–145)

## 2020-12-01 MED ORDER — AZITHROMYCIN 250 MG PO TABS: ORAL_TABLET | ORAL | 0 refills | Status: DC

## 2020-12-01 NOTE — ED Notes (Signed)
Pt d/c home per MD order. Discharge summary reviewed with pt and pt daughter, verbalize understanding. Off unit via WC. Daughter to transport pt back to facility.

## 2020-12-01 NOTE — ED Notes (Signed)
Spoke with [employer] at [facility], discharge summary reviewed, informed him pt daughter is transporting pt back to facility.

## 2020-12-01 NOTE — Discharge Instructions (Addendum)
It was our pleasure to provide your ER care today - we hope that you feel better.  Take antibiotic as prescribed. Use inhaler and take cough medication as need for symptom relief. (Your last cholesterol test result I can find is from last year and it was 215 then).  Follow up with primary care doctor in one week if symptoms fail to improve/resolve.  Return to ER if worse, new symptoms, high fevers, increased trouble breathing, increased coughing up of blood, or other concern.

## 2020-12-01 NOTE — ED Triage Notes (Signed)
Pt to ED via EMS from assisted living [facility] c/o hemoptysis. Pt reports she has been coughing up blood started about 3 hours ago, also coughing up mucus,Small amount, dark red in color. Reports hx of same.HX: HTN,CKD. Last VS: 136/70, P 86, 97%RA. Orientation at baseline A&OX4.

## 2020-12-01 NOTE — ED Provider Notes (Signed)
[facility] EMERGENCY DEPARTMENT Provider Note   CSN: [id number] Arrival date & time: 12/01/20  1155     History Chief Complaint  Patient presents with   Hemoptysis    Christine Vaughan is a 87 y.o. female.  Patient indicates has chronic cough, esp after albuterol treatment, and that last night/early AM, noted blood tingled to mucous/sputum. Symptoms acute onset, mild, episodic. Denies increase in cough. No chest pain or discomfort. No new or increased sob or doe. No leg pain or swelling. Denies anticoag use. No sore throat or runny nose. No nosebleeding. No blood in stool. No emesis. No faintness or dizziness. No fever or chills.   The history is provided by the patient, medical records and the EMS personnel.      Past Medical History:  Diagnosis Date   Anxiety    Breast cancer, right ([place]) 2001   Right Lumpectomy, chemo + rad tx's.    Chronic kidney disease    Hereditary and idiopathic peripheral neuropathy 12/27/2015   Hypertension    MAI (mycobacterium avium-intracellulare) ([place])    Thyroid disease     Patient Active Problem List   Diagnosis Date Noted   Orthostatic hypotension 06/10/2019   TIA (transient ischemic attack) 06/09/2019   Hyponatremia 06/09/2019   Hypokalemia 06/09/2019   Chronic deep vein thrombosis (DVT) of right lower extremity ([place]) 03/25/2018   Pulmonary embolus ([place]) 03/25/2018   Use of cane as ambulatory aid 01/15/2018   MAI (mycobacterium avium-intracellulare) infection ([place]) 06/16/2017   Headache syndrome 12/27/2015   Memory change 12/27/2015   Vitamin B12 deficiency 12/27/2015   Hereditary and idiopathic peripheral neuropathy 12/27/2015   Abnormality of gait 12/27/2015   Hypertension    Health care maintenance 10/12/2014   Mixed hyperlipidemia 10/07/2013   Trigeminal neuralgia 09/19/2013   Panic anxiety syndrome 09/19/2013   Osteopenia 09/19/2013   Hypothyroid 09/19/2013   Hypertensive cardiomegaly without heart  failure 09/19/2013   Depression, major, in remission ([place]) 09/19/2013   Bronchiectasis ([place]) 09/19/2013   History of radiation therapy 07/15/2011   Breast cancer ([place]) 07/15/2011   Scotoma involving central area of left eye 12/31/2010   Optic neuropathy 12/31/2010   Nuclear cataract 12/31/2010   Glaucoma suspect 12/31/2010    Past Surgical History:  Procedure Laterality Date   BACK SURGERY     BREAST BIOPSY Left    surgical bx pt dose not remember   BREAST EXCISIONAL BIOPSY Right 2001   + chem rad and armidex   BREAST LUMPECTOMY  2001   BREAST SURGERY     CHOLECYSTECTOMY     ESOPHAGOGASTRODUODENOSCOPY (EGD) WITH PROPOFOL N/A 09/20/2015   Procedure: ESOPHAGOGASTRODUODENOSCOPY (EGD) WITH PROPOFOL;  Surgeon: Lollie Sails, MD;  Location: [facility] ENDOSCOPY;  Service: Endoscopy;  Laterality: N/A;     OB History   No obstetric history on file.     Family History  Problem Relation Age of Onset   Depression Mother    Brain cancer Mother    Breast cancer Paternal Aunt     Social History   Tobacco Use   Smoking status: Never   Smokeless tobacco: Never  Vaping Use   Vaping Use: Never used  Substance Use Topics   Alcohol use: No   Drug use: No    Home Medications Prior to Admission medications   Medication Sig Start Date End Date Taking? Authorizing Provider  acetaminophen (TYLENOL) 500 MG tablet Take 250 mg by mouth daily as needed.     [provider]  albuterol (PROVENTIL) (5 MG/ML) 0.5% nebulizer solution Take 2.5 mg by nebulization every 6 (six) hours as needed for wheezing or shortness of breath.    [provider]  atorvastatin (LIPITOR) 40 MG tablet Take 1 tablet (40 mg total) by mouth at bedtime. 06/11/19   Jennye Boroughs, MD  azelastine (ASTELIN) 0.1 % nasal spray Place into the nose 2 (two) times daily. Use in each nostril as directed    [provider]  dicyclomine (BENTYL) 20 MG tablet Take 20 mg by mouth every 6 (six) hours.     [provider]  diphenhydramine-acetaminophen (TYLENOL PM) 25-500 MG TABS tablet Take 1 tablet by mouth at bedtime as needed.     [provider]  docusate sodium (COLACE) 100 MG capsule Take 1 capsule by mouth daily as needed.    [provider]  doxycycline (VIBRA-TABS) 100 MG tablet Take 1 tablet (100 mg total) by mouth 2 (two) times daily. 1 po bid 11/04/20   Hassell Done, Mary-Margaret, FNP  esomeprazole (NEXIUM) 40 MG capsule TAKE 1 CAPSULE BY MOUTH TWICE DAILY. TAKE 30 MINUTES BEFORE MEALS. 12/24/16   [provider]  gabapentin (NEURONTIN) 100 MG capsule Take 2 capsules (200 mg total) by mouth at bedtime. 08/31/18   Rainey Pines, MD  gentamicin cream (GARAMYCIN) 0.1 % Apply 1 application topically 2 (two) times daily. 07/28/18   Edrick Kins, DPM  ipratropium (ATROVENT) 0.06 % nasal spray Place 2 sprays into the nose 2 (two) times daily as needed. 03/19/18 06/09/19  [provider]  levofloxacin (LEVAQUIN) 250 MG tablet Take 250 mg by mouth daily. 06/02/19   [provider]  levothyroxine (SYNTHROID, LEVOTHROID) 50 MCG tablet Take 50 mcg by mouth daily before breakfast.    [provider]  LORazepam (ATIVAN) 0.5 MG tablet Take 0.25 mg by mouth 2 (two) times daily as needed. 06/02/19   [provider]  meclizine (ANTIVERT) 12.5 MG tablet Take 12.5 mg by mouth 3 (three) times daily as needed. 01/26/19   [provider]  ondansetron (ZOFRAN-ODT) 4 MG disintegrating tablet Take 4 mg by mouth every 8 (eight) hours as needed. 03/22/19   [provider]  sertraline (ZOLOFT) 25 MG tablet Take 1 tablet (25 mg total) by mouth daily. 11/09/18   Rainey Pines, MD  tamsulosin (FLOMAX) 0.4 MG CAPS capsule TAKE 1 CAPSULE EVERY DAY 07/14/19   Vaillancourt, Samantha, PA-C  tobramycin, PF, (TOBI) 300 MG/5ML nebulizer solution Take 300 mg by nebulization 2 (two) times daily.    [provider]  torsemide (DEMADEX) 20 MG tablet Take  1 tablet (20 mg total) by mouth daily for 5 days. 05/12/20 05/17/20  Curatolo, Adam, DO  traMADol (ULTRAM) 50 MG tablet Take 1 tablet (50 mg total) by mouth every 6 (six) hours as needed. 03/12/18   Carrie Mew, MD  traZODone (DESYREL) 50 MG tablet Take 50 mg by mouth at bedtime as needed. 04/30/19   [provider]    Allergies    Ciprofloxacin, Elemental sulfur, Erythromycin, Metronidazole, Tizanidine, Penicillins, and Sulfamethoxazole  Review of Systems   Review of Systems  Constitutional:  Negative for chills and fever.  HENT:  Negative for nosebleeds and sore throat.   Eyes:  Negative for redness.  Respiratory:  Positive for cough. Negative for shortness of breath.   Cardiovascular:  Negative for chest pain and leg swelling.  Gastrointestinal:  Negative for abdominal pain and blood in stool.  Genitourinary:  Negative for flank pain.  Musculoskeletal:  Negative for back pain and neck pain.  Skin:  Negative for rash.  Neurological:  Negative for light-headedness.  Hematological:  Does not bruise/bleed easily.  Psychiatric/Behavioral:  Negative for confusion.    Physical Exam Updated Vital Signs BP (!) 156/70   Pulse 82   Temp 98.6 F (37 C) (Oral)   Resp 16   Ht 1.651 m (5\' 5" )   Wt 50.3 kg   SpO2 97%   BMI 18.47 kg/m   Physical Exam Vitals and nursing note reviewed.  Constitutional:      Appearance: Normal appearance. She is well-developed.  HENT:     Head: Atraumatic.     Nose: Nose normal.     Mouth/Throat:     Mouth: Mucous membranes are moist.     Pharynx: Oropharynx is clear.  Eyes:     General: No scleral icterus.    Conjunctiva/sclera: Conjunctivae normal.  Neck:     Trachea: No tracheal deviation.  Cardiovascular:     Rate and Rhythm: Normal rate and regular rhythm.     Pulses: Normal pulses.     Heart sounds: Normal heart sounds. No murmur heard.   No friction rub. No gallop.  Pulmonary:     Effort: Pulmonary effort is normal. No  respiratory distress.     Breath sounds: Normal breath sounds.  Abdominal:     General: Bowel sounds are normal. There is no distension.     Palpations: Abdomen is soft.     Tenderness: There is no abdominal tenderness. There is no guarding.  Genitourinary:    Comments: No cva tenderness.  Musculoskeletal:        General: No swelling or tenderness.     Cervical back: Normal range of motion and neck supple. No rigidity. No muscular tenderness.     Right lower leg: No edema.     Left lower leg: No edema.  Skin:    General: Skin is warm and dry.     Findings: No rash.  Neurological:     Mental Status: She is alert.     Comments: Alert, speech normal.   Psychiatric:        Mood and Affect: Mood normal.    ED Results / Procedures / Treatments   Labs (all labs ordered are listed, but only abnormal results are displayed) Results for orders placed or performed during the hospital encounter of 12/01/20  Resp Panel by RT-PCR (Flu A&B, Covid) Nasopharyngeal Swab   Specimen: Nasopharyngeal Swab; Nasopharyngeal(NP) swabs in vial transport medium  Result Value Ref Range   SARS Coronavirus 2 by RT PCR NEGATIVE NEGATIVE   Influenza A by PCR NEGATIVE NEGATIVE   Influenza B by PCR NEGATIVE NEGATIVE  CBC  Result Value Ref Range   WBC 6.4 4.0 - 10.5 K/uL   RBC 3.36 (L) 3.87 - 5.11 MIL/uL   Hemoglobin 10.4 (L) 12.0 - 15.0 g/dL   HCT 32.8 (L) 36.0 - 46.0 %   MCV 97.6 80.0 - 100.0 fL   MCH 31.0 26.0 - 34.0 pg   MCHC 31.7 30.0 - 36.0 g/dL   RDW 15.1 11.5 - 15.5 %   Platelets 236 150 - 400 K/uL   nRBC 0.0 0.0 - 0.2 %  Basic metabolic panel  Result Value Ref Range   Sodium 131 (L) 135 - 145 mmol/L   Potassium 4.1 3.5 - 5.1 mmol/L   Chloride 97 (L) 98 - 111 mmol/L   CO2 23 22 - 32 mmol/L  Glucose, Bld 86 70 - 99 mg/dL   BUN 12 8 - 23 mg/dL   Creatinine, Ser 0.86 0.44 - 1.00 mg/dL   Calcium 9.0 8.9 - 10.3 mg/dL   GFR, Estimated >60 >60 mL/min   Anion gap 11 5 - 15      EKG None  Radiology DG Chest Port 1 View  Result Date: 12/01/2020 CLINICAL DATA:  Chronic cough worse since last night, coughed up bright red blood 7 times, ankle swelling. History hypertension, atrial fibrillation, bronchiectasis, RIGHT breast cancer with surgery chemotherapy and radiation therapy EXAM: PORTABLE CHEST 1 VIEW COMPARISON:  Portable exam 1236 hours compared to 05/12/2020 FINDINGS: Normal heart size, mediastinal contours, and pulmonary vascularity. Chronic interstitial changes bilaterally appears stable. No acute infiltrate, pleural effusion, or pneumothorax. Tiny calcified granuloma LEFT upper lobe stable with calcified lymph node at LEFT hilum. Surgical clips at the breasts bilaterally as well as RIGHT axilla. Bones demineralized. IMPRESSION: Chronic interstitial lung disease, stable. No acute abnormalities. Electronically Signed   By: Lavonia Dana M.D.   On: 12/01/2020 13:11    Procedures Procedures   Medications Ordered in ED Medications - No data to display  ED Course  I have reviewed the triage vital signs and the nursing notes.  Pertinent labs & imaging results that were available during my care of the patient were reviewed by me and considered in my medical decision making (see chart for details).    MDM Rules/Calculators/A&P                           Labs and cxr ordered.   Reviewed nursing notes and prior charts for additional history.   Labs reviewed/interpreted by me -  wbc and plt normal, hgb c/w baseline.  CXR reviewed/interpreted by me - no pna.   No episodes or hemoptysis in ED.  Pt is currently breathing comfortably, pulse ox 97-98%.   Pt appears stable for d/c.   Rec pcp f/u.  Return precaution provided.      Final Clinical Impression(s) / ED Diagnoses Final diagnoses:  None    Rx / DC Orders ED Discharge Orders     None        Lajean Saver, MD 12/01/20 1504

## 2020-12-22 ENCOUNTER — Emergency Department (HOSPITAL_COMMUNITY): Payer: Medicare PPO

## 2020-12-22 ENCOUNTER — Emergency Department (HOSPITAL_COMMUNITY)
Admission: EM | Admit: 2020-12-22 | Discharge: 2020-12-23 | Disposition: A | Discharge status: Home / Self Care | Payer: Medicare PPO | Attending: Emergency Medicine | Admitting: Emergency Medicine

## 2020-12-22 DIAGNOSIS — Z853 Personal history of malignant neoplasm of breast: Secondary | ICD-10-CM | POA: Insufficient documentation

## 2020-12-22 DIAGNOSIS — I129 Hypertensive chronic kidney disease with stage 1 through stage 4 chronic kidney disease, or unspecified chronic kidney disease: Secondary | ICD-10-CM | POA: Diagnosis not present

## 2020-12-22 DIAGNOSIS — Z79899 Other long term (current) drug therapy: Secondary | ICD-10-CM | POA: Diagnosis not present

## 2020-12-22 DIAGNOSIS — N189 Chronic kidney disease, unspecified: Secondary | ICD-10-CM | POA: Diagnosis not present

## 2020-12-22 DIAGNOSIS — R059 Cough, unspecified: Secondary | ICD-10-CM | POA: Insufficient documentation

## 2020-12-22 DIAGNOSIS — R0602 Shortness of breath: Secondary | ICD-10-CM | POA: Diagnosis not present

## 2020-12-22 DIAGNOSIS — R1310 Dysphagia, unspecified: Secondary | ICD-10-CM | POA: Diagnosis not present

## 2020-12-22 DIAGNOSIS — Z20822 Contact with and (suspected) exposure to covid-19: Secondary | ICD-10-CM | POA: Insufficient documentation

## 2020-12-22 DIAGNOSIS — R109 Unspecified abdominal pain: Secondary | ICD-10-CM | POA: Insufficient documentation

## 2020-12-22 LAB — URINALYSIS, ROUTINE W REFLEX MICROSCOPIC
Bilirubin Urine: NEGATIVE
Glucose, UA: NEGATIVE mg/dL
Ketones, ur: NEGATIVE mg/dL
Nitrite: NEGATIVE
Protein, ur: NEGATIVE mg/dL
Specific Gravity, Urine: 1.011 (ref 1.005–1.030)
pH: 5 (ref 5.0–8.0)

## 2020-12-22 LAB — CBC WITH DIFFERENTIAL/PLATELET
Abs Immature Granulocytes: 0.01 10*3/uL (ref 0.00–0.07)
Basophils Absolute: 0.1 10*3/uL (ref 0.0–0.1)
Basophils Relative: 1 %
Eosinophils Absolute: 0.1 10*3/uL (ref 0.0–0.5)
Eosinophils Relative: 3 %
HCT: 33.9 % — ABNORMAL LOW (ref 36.0–46.0)
Hemoglobin: 11 g/dL — ABNORMAL LOW (ref 12.0–15.0)
Immature Granulocytes: 0 %
Lymphocytes Relative: 12 %
Lymphs Abs: 0.6 10*3/uL — ABNORMAL LOW (ref 0.7–4.0)
MCH: 31.5 pg (ref 26.0–34.0)
MCHC: 32.4 g/dL (ref 30.0–36.0)
MCV: 97.1 fL (ref 80.0–100.0)
Monocytes Absolute: 0.4 10*3/uL (ref 0.1–1.0)
Monocytes Relative: 8 %
Neutro Abs: 3.8 10*3/uL (ref 1.7–7.7)
Neutrophils Relative %: 76 %
Platelets: 206 10*3/uL (ref 150–400)
RBC: 3.49 MIL/uL — ABNORMAL LOW (ref 3.87–5.11)
RDW: 15.1 % (ref 11.5–15.5)
WBC: 5 10*3/uL (ref 4.0–10.5)
nRBC: 0 % (ref 0.0–0.2)

## 2020-12-22 LAB — COMPREHENSIVE METABOLIC PANEL
ALT: 9 U/L (ref 0–44)
AST: 19 U/L (ref 15–41)
Albumin: 3.9 g/dL (ref 3.5–5.0)
Alkaline Phosphatase: 57 U/L (ref 38–126)
Anion gap: 11 (ref 5–15)
BUN: 19 mg/dL (ref 8–23)
CO2: 24 mmol/L (ref 22–32)
Calcium: 9.3 mg/dL (ref 8.9–10.3)
Chloride: 100 mmol/L (ref 98–111)
Creatinine, Ser: 1.05 mg/dL — ABNORMAL HIGH (ref 0.44–1.00)
GFR, Estimated: 52 mL/min — ABNORMAL LOW (ref >60)
Glucose, Bld: 91 mg/dL (ref 70–99)
Potassium: 3.8 mmol/L (ref 3.5–5.1)
Sodium: 135 mmol/L (ref 135–145)
Total Bilirubin: 1.1 mg/dL (ref 0.3–1.2)
Total Protein: 6.8 g/dL (ref 6.5–8.1)

## 2020-12-22 LAB — TROPONIN I (HIGH SENSITIVITY)
Troponin I (High Sensitivity): 9 ng/L (ref <18)
Troponin I (High Sensitivity): 10 ng/L (ref <18)

## 2020-12-22 LAB — LIPASE, BLOOD: Lipase: 34 U/L (ref 11–51)

## 2020-12-22 NOTE — ED Provider Notes (Signed)
Emergency Medicine Provider Triage Evaluation Note  Christine Vaughan , a 87 y.o. female  was evaluated in triage.  Pt complains of shortness of breath.  She feels abdominal distention, pressure about her upper abdomen.  She is anxious due to medication changes per family.  Patient is unable to tell me if she feels like she has an esophageal obstruction, repeatedly telling me that she ate small bites of eggs and followed them with water. She reports that she has a hard time breathing when she eats.   Review of Systems  Positive: Shortness of breath, abdominal pain Negative: syncope  Physical Exam  BP (!) 132/110 (BP Location: Left Arm)   Pulse 80   Temp 98.6 F (37 C) (Oral)   Resp 16   SpO2 99%  Gen:   Awake, no distress   Resp:  Normal effort  MSK:   Moves extremities without difficulty  Other:  Normal speech.  No respiratory distress  Medical Decision Making  Medically screening exam initiated at 4:35 PM.  Appropriate orders placed.  [name] was informed that the remainder of the evaluation will be completed by another provider, this initial triage assessment does not replace that evaluation, and the importance of remaining in the ED until their evaluation is complete.     Lorin Glass, PA-C 12/22/20 1638    Lucrezia Starch, MD 12/23/20 [phone]

## 2020-12-22 NOTE — ED Provider Notes (Addendum)
[facility] EMERGENCY DEPARTMENT Provider Note   CSN: [id number] Arrival date & time: 12/22/20  1527     History Chief Complaint  Patient presents with   Shortness of Breath    Christine Vaughan is a 87 y.o. female.  Patient with history of hypertension, anxiety, CKD, previous DVT, PE no longer on anticoagulation here with difficulty breathing and difficulty swallowing.  States she had difficulty swallowing for several months has had swallowing studies that told her she had a narrow esophagus.  She was told to take small bites of food and frequent drinking which she does.  She states she has not want another esophageal dilation unless had one about 40 years ago.  Over the past 2 days she feels like her swallowing has become more difficult and is causing her to have shortness of breath.  She states she is still able to swallow small bites and take her medications drink water without vomiting.  She does feel more short of breath and has a productive cough of clear yellow mucus.  No hemoptysis.  No chest pain.  Does have some upper abdominal pain.  No vomiting.  Last bowel movement was 3 days ago.  No pain with urination or blood in urine.  Family reports she had recent medication changes due to her anxiety.  She feels like she could have an esophageal obstruction.  However she is still able to eat small bites and drink water.  She feels like she has more shortness of breath when she tries to eat.  She recently had her Zoloft switched from morning to afternoon  The history is provided by the patient and a relative.  Shortness of Breath Associated symptoms: abdominal pain and cough   Associated symptoms: no chest pain, no fever, no headaches, no rash and no vomiting       Past Medical History:  Diagnosis Date   Anxiety    Breast cancer, right ([place]) 2001   Right Lumpectomy, chemo + rad tx's.    Chronic kidney disease    Hereditary and idiopathic peripheral neuropathy  12/27/2015   Hypertension    MAI (mycobacterium avium-intracellulare) ([place])    Thyroid disease     Patient Active Problem List   Diagnosis Date Noted   Orthostatic hypotension 06/10/2019   TIA (transient ischemic attack) 06/09/2019   Hyponatremia 06/09/2019   Hypokalemia 06/09/2019   Chronic deep vein thrombosis (DVT) of right lower extremity ([place]) 03/25/2018   Pulmonary embolus ([place]) 03/25/2018   Use of cane as ambulatory aid 01/15/2018   MAI (mycobacterium avium-intracellulare) infection ([place]) 06/16/2017   Headache syndrome 12/27/2015   Memory change 12/27/2015   Vitamin B12 deficiency 12/27/2015   Hereditary and idiopathic peripheral neuropathy 12/27/2015   Abnormality of gait 12/27/2015   Hypertension    Health care maintenance 10/12/2014   Mixed hyperlipidemia 10/07/2013   Trigeminal neuralgia 09/19/2013   Panic anxiety syndrome 09/19/2013   Osteopenia 09/19/2013   Hypothyroid 09/19/2013   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) 09/19/2013   History of radiation therapy 07/15/2011   Breast cancer ([place]) 07/15/2011   Scotoma involving central area of left eye 12/31/2010   Optic neuropathy 12/31/2010   Nuclear cataract 12/31/2010   Glaucoma suspect 12/31/2010    Past Surgical History:  Procedure Laterality Date   BACK SURGERY     BREAST BIOPSY Left    surgical bx pt dose not remember  BREAST EXCISIONAL BIOPSY Right 2001   + chem rad and armidex   BREAST LUMPECTOMY  2001   BREAST SURGERY     CHOLECYSTECTOMY     ESOPHAGOGASTRODUODENOSCOPY (EGD) WITH PROPOFOL N/A 09/20/2015   Procedure: ESOPHAGOGASTRODUODENOSCOPY (EGD) WITH PROPOFOL;  Surgeon: Lollie Sails, MD;  Location: [facility] ENDOSCOPY;  Service: Endoscopy;  Laterality: N/A;     OB History   No obstetric history on file.     Family History  Problem Relation Age of Onset   Depression Mother    Brain cancer  Mother    Breast cancer Paternal Aunt     Social History   Tobacco Use   Smoking status: Never   Smokeless tobacco: Never  Vaping Use   Vaping Use: Never used  Substance Use Topics   Alcohol use: No   Drug use: No    Home Medications Prior to Admission medications   Medication Sig Start Date End Date Taking? Authorizing Provider  acetaminophen (TYLENOL) 500 MG tablet Take 250 mg by mouth daily as needed.     [provider]  albuterol (PROVENTIL) (5 MG/ML) 0.5% nebulizer solution Take 2.5 mg by nebulization every 6 (six) hours as needed for wheezing or shortness of breath.    [provider]  atorvastatin (LIPITOR) 40 MG tablet Take 1 tablet (40 mg total) by mouth at bedtime. 06/11/19   Jennye Boroughs, MD  azelastine (ASTELIN) 0.1 % nasal spray Place into the nose 2 (two) times daily. Use in each nostril as directed    [provider]  azithromycin (ZITHROMAX Z-PAK) 250 MG tablet Take two tablets today, then one tablet a day for the next four days. 12/01/20   Lajean Saver, MD  dicyclomine (BENTYL) 20 MG tablet Take 20 mg by mouth every 6 (six) hours.    [provider]  diphenhydramine-acetaminophen (TYLENOL PM) 25-500 MG TABS tablet Take 1 tablet by mouth at bedtime as needed.     [provider]  docusate sodium (COLACE) 100 MG capsule Take 1 capsule by mouth daily as needed.    [provider]  esomeprazole (NEXIUM) 40 MG capsule TAKE 1 CAPSULE BY MOUTH TWICE DAILY. TAKE 30 MINUTES BEFORE MEALS. 12/24/16   [provider]  gabapentin (NEURONTIN) 100 MG capsule Take 2 capsules (200 mg total) by mouth at bedtime. 08/31/18   Rainey Pines, MD  gentamicin cream (GARAMYCIN) 0.1 % Apply 1 application topically 2 (two) times daily. 07/28/18   Edrick Kins, DPM  ipratropium (ATROVENT) 0.06 % nasal spray Place 2 sprays into the nose 2 (two) times daily as needed. 03/19/18 06/09/19  [provider]  levothyroxine (SYNTHROID,  LEVOTHROID) 50 MCG tablet Take 50 mcg by mouth daily before breakfast.    [provider]  LORazepam (ATIVAN) 0.5 MG tablet Take 0.25 mg by mouth 2 (two) times daily as needed. 06/02/19   [provider]  meclizine (ANTIVERT) 12.5 MG tablet Take 12.5 mg by mouth 3 (three) times daily as needed. 01/26/19   [provider]  ondansetron (ZOFRAN-ODT) 4 MG disintegrating tablet Take 4 mg by mouth every 8 (eight) hours as needed. 03/22/19   [provider]  sertraline (ZOLOFT) 25 MG tablet Take 1 tablet (25 mg total) by mouth daily. 11/09/18   Rainey Pines, MD  tamsulosin (FLOMAX) 0.4 MG CAPS capsule TAKE 1 CAPSULE EVERY DAY 07/14/19   Vaillancourt, Samantha, PA-C  tobramycin, PF, (TOBI) 300 MG/5ML nebulizer solution Take 300 mg by nebulization 2 (two) times daily.  [provider]  torsemide (DEMADEX) 20 MG tablet Take 1 tablet (20 mg total) by mouth daily for 5 days. 05/12/20 05/17/20  Curatolo, Adam, DO  traMADol (ULTRAM) 50 MG tablet Take 1 tablet (50 mg total) by mouth every 6 (six) hours as needed. 03/12/18   Carrie Mew, MD  traZODone (DESYREL) 50 MG tablet Take 50 mg by mouth at bedtime as needed. 04/30/19   [provider]    Allergies    Ciprofloxacin, Elemental sulfur, Erythromycin, Metronidazole, Tizanidine, Penicillins, and Sulfamethoxazole  Review of Systems   Review of Systems  Constitutional:  Negative for activity change, appetite change and fever.  HENT:  Negative for congestion and rhinorrhea.   Respiratory:  Positive for cough and shortness of breath. Negative for chest tightness.   Cardiovascular:  Negative for chest pain.  Gastrointestinal:  Positive for abdominal pain and nausea. Negative for vomiting.  Genitourinary:  Negative for dysuria and hematuria.  Musculoskeletal:  Negative for arthralgias and myalgias.  Skin:  Negative for rash.  Neurological:  Negative for dizziness, weakness and headaches.   all other systems are  negative except as noted in the HPI and PMH.   Physical Exam Updated Vital Signs BP (!) 141/75   Pulse 77   Temp 98.6 F (37 C) (Oral)   Resp 19   SpO2 100%   Physical Exam Vitals and nursing note reviewed.  Constitutional:      General: She is not in acute distress.    Appearance: She is well-developed.     Comments: Anxious appearing  HENT:     Head: Normocephalic and atraumatic.     Mouth/Throat:     Pharynx: No oropharyngeal exudate.  Eyes:     Conjunctiva/sclera: Conjunctivae normal.     Pupils: Pupils are equal, round, and reactive to light.  Neck:     Comments: No meningismus. Cardiovascular:     Rate and Rhythm: Normal rate and regular rhythm.     Heart sounds: Normal heart sounds. No murmur heard. Pulmonary:     Effort: Pulmonary effort is normal. No respiratory distress.     Breath sounds: Rhonchi present.     Comments: No distress, scattered rhonchi Abdominal:     Palpations: Abdomen is soft.     Tenderness: There is no abdominal tenderness. There is no guarding or rebound.     Comments: TTP epigastrium  Musculoskeletal:        General: No tenderness. Normal range of motion.     Cervical back: Normal range of motion and neck supple.  Skin:    General: Skin is warm.  Neurological:     Mental Status: She is alert and oriented to person, place, and time.     Cranial Nerves: No cranial nerve deficit.     Motor: No abnormal muscle tone.     Coordination: Coordination normal.     Comments:  5/5 strength throughout. CN 2-12 intact.Equal grip strength.   Psychiatric:        Behavior: Behavior normal.    ED Results / Procedures / Treatments   Labs (all labs ordered are listed, but only abnormal results are displayed) Labs Reviewed  COMPREHENSIVE METABOLIC PANEL - Abnormal; Notable for the following components:      Result Value   Creatinine, Ser 1.05 (*)    GFR, Estimated 52 (*)    All other components within normal limits  CBC WITH DIFFERENTIAL/PLATELET  - Abnormal; Notable for the following components:   RBC 3.49 (*)  Hemoglobin 11.0 (*)    HCT 33.9 (*)    Lymphs Abs 0.6 (*)    All other components within normal limits  URINALYSIS, ROUTINE W REFLEX MICROSCOPIC - Abnormal; Notable for the following components:   APPearance HAZY (*)    Hgb urine dipstick SMALL (*)    Leukocytes,Ua MODERATE (*)    Bacteria, UA FEW (*)    Non Squamous Epithelial 0-5 (*)    All other components within normal limits  D-DIMER, QUANTITATIVE - Abnormal; Notable for the following components:   D-Dimer, Quant 2.15 (*)    All other components within normal limits  RESP PANEL BY RT-PCR (FLU A&B, COVID) ARPGX2  URINE CULTURE  LIPASE, BLOOD  BRAIN NATRIURETIC PEPTIDE  TROPONIN I (HIGH SENSITIVITY)  TROPONIN I (HIGH SENSITIVITY)    EKG EKG Interpretation  Date/Time:  Friday December 22 2020 15:31:35 EDT Ventricular Rate:  88 PR Interval:  144 QRS Duration: 116 QT Interval:  382 QTC Calculation: 462 R Axis:   -64 Text Interpretation: Normal sinus rhythm Pulmonary disease pattern Left anterior fascicular block Left ventricular hypertrophy with QRS widening and repolarization abnormality ( R in aVL , Cornell product ) Abnormal ECG No significant change was found Confirmed by Ezequiel Essex [phone]) on 12/22/2020 10:55:44 PM  Radiology DG Chest 2 View  Result Date: 12/22/2020 CLINICAL DATA:  Short of breath, productive cough EXAM: CHEST - 2 VIEW COMPARISON:  12/01/2020 FINDINGS: Frontal and lateral views of the chest demonstrate an unremarkable cardiac silhouette. Chronic interstitial scarring again noted, without acute airspace disease. Stable trace right pleural effusion versus right pleural thickening. No pneumothorax. No acute bony abnormalities. IMPRESSION: 1. Stable interstitial scarring.  No acute airspace disease. 2. Stable trace right pleural effusion versus pleural thickening. Electronically Signed   By: Randa Ngo M.D.   On: 12/22/2020 17:23     Procedures Procedures   Medications Ordered in ED Medications - No data to display  ED Course  I have reviewed the triage vital signs and the nursing notes.  Pertinent labs & imaging results that were available during my care of the patient were reviewed by me and considered in my medical decision making (see chart for details).    MDM Rules/Calculators/A&P                           Acute on chronic dysphagia here with increased shortness of breath over the past 2 days.  No hypoxia or increased work of breathing.  No wheezing on exam.  Chest x-ray shows stable scarring.  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 She had an esophageal dilation 40 years ago but does not want to pursue that again at her age  Patient was seen by speech pathology with the following recommendations: "Recommend continuing with soft solids and thin liquids as tolerated but would also implement an intermittent throat clear to try to facilitate airway protection"  Labs reassuring. Troponin negative x2. Low suspicion for ACS.  Patient does have a remote history of pulmonary embolism.  No longer on anticoagulation.  She is complaining of some shortness of breath though is not hypoxic and lungs are clear.  D-dimer is elevated.  CT scan was attempted but her IV infiltrated and she is refusing further IV attempts. Tech stated only saline infiltrated, not contrast.  She has abdominal pain as well so CT of abdomen pelvis was planned for  further evaluation to rule out bowel obstruction.  She adamantly refuses IV attempt again but is agreeable to CT scan without contrast.   Continues to complain of abdominal pain as well as some shortness of breath when she tries to swallow.  She understands that pulmonary embolism cannot be ruled out without IV contrast and can be life-threatening.  She appears to have capacity to make that  decision  Patient agreeable to CT abdomen pelvis without contrast to assess for bowel obstruction.  No witnessed vomiting.  She is awaiting p.o. challenge.  Continues to decline IV for further assessment for pulmonary embolism but denies feeling shortness of breath.  She has no hypoxia and no increased work of breathing.  Will attempt a VQ scan to rule out pulmonary embolism.  Patient need to follow-up with her PCP as well as gastroenterology for further evaluation of her dysphagia.   Care to be transferred at shift change to Dr. Matilde Sprang.  Final Clinical Impression(s) / ED Diagnoses Final diagnoses:  None    Rx / DC Orders ED Discharge Orders     None        Legrande Hao, Annie Main, MD 12/23/20 0710    Ezequiel Essex, MD 12/23/20 [phone]

## 2020-12-22 NOTE — ED Triage Notes (Signed)
Patient BIB [facility] from [facility] for evaluation of shortness of breath and productive cough with dark brown sputum. Patient alert, oriented, and in no apparent distress at this time.  EMS vitals 140/84 HR 85 SpO2 99% on room air (has PRN O2 at home)

## 2020-12-23 ENCOUNTER — Emergency Department (HOSPITAL_COMMUNITY): Payer: Medicare PPO

## 2020-12-23 LAB — BRAIN NATRIURETIC PEPTIDE: B Natriuretic Peptide: 84.6 pg/mL (ref 0.0–100.0)

## 2020-12-23 LAB — RESP PANEL BY RT-PCR (FLU A&B, COVID) ARPGX2
Influenza A by PCR: NEGATIVE
Influenza B by PCR: NEGATIVE
SARS Coronavirus 2 by RT PCR: NEGATIVE

## 2020-12-23 LAB — D-DIMER, QUANTITATIVE: D-Dimer, Quant: 2.15 ug/mL-FEU — ABNORMAL HIGH (ref 0.00–0.50)

## 2020-12-23 NOTE — Discharge Instructions (Addendum)
Followup with your GI doctor for further evaluation of your swallowing difficulty.  You declined CT scan today for further assessment of your shortness of breath and to rule out blood clot.  At the time of your discharge, we cannot definitively rule out a blood clot in your lungs as the cause of your shortness of breath.  Your oxygen levels and heart rate are normal which decrease your risk but not fully.  You voiced that you understand the risks of leaving the ER today prior to receiving the scan and it is very important that you return to the emergency department if you have new or worsening chest pain, shortness of breath, fever or any other concerning symptoms.

## 2020-12-23 NOTE — ED Provider Notes (Signed)
  Physical Exam  BP (!) 159/89 (BP Location: Left Arm)   Pulse 88   Temp 97.6 F (36.4 C) (Oral)   Resp 20   SpO2 97%   Physical Exam Vitals and nursing note reviewed.  Constitutional:      General: She is not in acute distress.    Appearance: She is well-developed.  HENT:     Head: Normocephalic and atraumatic.  Eyes:     Conjunctiva/sclera: Conjunctivae normal.  Cardiovascular:     Rate and Rhythm: Normal rate and regular rhythm.     Heart sounds: No murmur heard. Pulmonary:     Effort: Pulmonary effort is normal. No respiratory distress.     Breath sounds: Normal breath sounds.  Abdominal:     Palpations: Abdomen is soft.     Tenderness: There is no abdominal tenderness.  Musculoskeletal:     Cervical back: Neck supple.     Comments: Right upper extremity mild swelling at the site of contrast extravasation, no distal discoloration, tenderness numbness or tingling  Skin:    General: Skin is warm and dry.  Neurological:     Mental Status: She is alert.    ED Course/Procedures     Procedures  MDM  Patient received in handoff.  Complaints of dysphagia and shortness of breath.  Pending CT abdomen pelvis and a VQ scan at discharge.  On reevaluation, patient is no longer complaining of shortness of breath.  She is requesting discharge and we had a very long discussion about her elevated D-dimer and our inability to rule out pulmonary emboli without further imaging.  Even using the age-adjusted D-dimer, the patient's dimer of 2.15 would still warrant further imaging.  The patient understood the risks and benefits of not obtaining this imaging study and is requesting discharge at this time.  She was given strict return precautions for both the contrast extravasation noted in her left upper extremity and her shortness of breath.  Patient then discharged.       Teressa Lower, MD 12/23/20 [phone]

## 2020-12-23 NOTE — ED Notes (Signed)
Patient taken to CT.

## 2020-12-23 NOTE — Progress Notes (Signed)
IVT consult:  L upper arm with swelling; Pt;daughter stated IV was placed earlier but later removed dt swelling. L arm elevated . Pt refused another IV placement at this time. RN at bedside.Advised pt to keep elevating arm to reduce swelling.

## 2020-12-24 LAB — URINE CULTURE: Culture: >=100000 — AB

## 2021-04-13 ENCOUNTER — Encounter: Payer: Self-pay | Admitting: Gastroenterology

## 2021-04-26 ENCOUNTER — Other Ambulatory Visit (HOSPITAL_BASED_OUTPATIENT_CLINIC_OR_DEPARTMENT_OTHER): Payer: Self-pay

## 2021-04-26 ENCOUNTER — Emergency Department (HOSPITAL_BASED_OUTPATIENT_CLINIC_OR_DEPARTMENT_OTHER): Payer: Medicare PPO

## 2021-04-26 ENCOUNTER — Emergency Department (HOSPITAL_BASED_OUTPATIENT_CLINIC_OR_DEPARTMENT_OTHER): Payer: Medicare PPO | Admitting: Radiology

## 2021-04-26 ENCOUNTER — Emergency Department (HOSPITAL_BASED_OUTPATIENT_CLINIC_OR_DEPARTMENT_OTHER)
Admission: EM | Admit: 2021-04-26 | Discharge: 2021-04-26 | Disposition: A | Discharge status: Home / Self Care | Payer: Medicare PPO | Attending: Emergency Medicine | Admitting: Emergency Medicine

## 2021-04-26 ENCOUNTER — Encounter (HOSPITAL_BASED_OUTPATIENT_CLINIC_OR_DEPARTMENT_OTHER): Payer: Self-pay

## 2021-04-26 ENCOUNTER — Other Ambulatory Visit: Payer: Self-pay

## 2021-04-26 DIAGNOSIS — J984 Other disorders of lung: Secondary | ICD-10-CM | POA: Diagnosis not present

## 2021-04-26 DIAGNOSIS — Z7982 Long term (current) use of aspirin: Secondary | ICD-10-CM | POA: Diagnosis not present

## 2021-04-26 DIAGNOSIS — Z20822 Contact with and (suspected) exposure to covid-19: Secondary | ICD-10-CM | POA: Insufficient documentation

## 2021-04-26 DIAGNOSIS — R059 Cough, unspecified: Secondary | ICD-10-CM | POA: Insufficient documentation

## 2021-04-26 DIAGNOSIS — R053 Chronic cough: Secondary | ICD-10-CM | POA: Insufficient documentation

## 2021-04-26 LAB — RESP PANEL BY RT-PCR (FLU A&B, COVID) ARPGX2
Influenza A by PCR: NEGATIVE
Influenza B by PCR: NEGATIVE
SARS Coronavirus 2 by RT PCR: NEGATIVE

## 2021-04-26 MED ORDER — PREDNISONE 50 MG PO TABS
ORAL_TABLET | ORAL | 0 refills | Status: DC
  Filled 2021-04-26: qty 5, 5d supply, fill #0

## 2021-04-26 NOTE — ED Notes (Signed)
RT has assessed ?

## 2021-04-26 NOTE — ED Triage Notes (Signed)
Patient here POV from [place] with Family. ? ?Patient has a History of "Lung Problems" and has been being seen by Pulmonology for Same.  ? ?Patient placed on Ciprofloxacin 1 week PTA for Productive Cough by Same. ? ?Symptoms include Productive Cough and Congestion. No Known Fevers.  ? ?NAD Noted during Triage. BIB Personal Wheelchair. A&Ox4. GCS 15.  ?

## 2021-04-26 NOTE — ED Provider Notes (Signed)
?[facility] ?Provider Note ? ? ?CSN: [id number] ?Arrival date & time: 04/26/21  1128 ? ?  ? ?History ? ?Chief Complaint  ?Patient presents with  ? Cough  ? ? ?QUINITA KOSTELECKY is a 88 y.o. female. ? ?88 year old female presents with worsening chronic cough times a week.  Was recently on antibiotics and just finished her ciprofloxacin.  No fever or chills.  Denies any severe new dyspnea.  Patient has a longstanding history of chronic lung disease.  Called her doctor and told to come here ? ? ?  ? ?Home Medications ?Prior to Admission medications   ?Medication Sig Start Date End Date Taking? Authorizing Provider  ?acetaminophen (TYLENOL) 500 MG tablet Take 500 mg by mouth every 6 (six) hours as needed for moderate pain or headache.    [provider]  ?albuterol (VENTOLIN HFA) 108 (90 Base) MCG/ACT inhaler Inhale 2 puffs into the lungs every 6 (six) hours as needed for wheezing or shortness of breath.    [provider]  ?ALPRAZolam Duanne Moron) 0.25 MG tablet Take 0.125 mg by mouth 2 (two) times daily as needed for anxiety.    [provider]  ?aspirin EC 81 MG tablet Take 81 mg by mouth at bedtime. Swallow whole.    [provider]  ?atorvastatin (LIPITOR) 40 MG tablet Take 1 tablet (40 mg total) by mouth at bedtime. ?Patient not taking: No sig reported 06/11/19   Jennye Boroughs, MD  ?azithromycin (ZITHROMAX Z-PAK) 250 MG tablet Take two tablets today, then one tablet a day for the next four days. ?Patient not taking: No sig reported 12/01/20   Lajean Saver, MD  ?docusate sodium (COLACE) 100 MG capsule Take 100 mg by mouth daily.    [provider]  ?esomeprazole (NEXIUM) 40 MG capsule Take 40 mg by mouth daily at 4 PM. 12/24/16   [provider]  ?EZALLOR SPRINKLE 10 MG CPSP Take 1 capsule by mouth at bedtime. Open and sprinkle in applesauce 12/11/20   [provider]  ?gabapentin (NEURONTIN) 100 MG capsule Take 100-200 mg by  mouth See admin instructions. 100 mg in the morning ?200 mg at bedtime    [provider]  ?gentamicin cream (GARAMYCIN) 0.1 % Apply 1 application topically 2 (two) times daily. ?Patient not taking: No sig reported 07/28/18   Edrick Kins, DPM  ?ipratropium (ATROVENT) 0.06 % nasal spray Place 2 sprays into the nose 2 (two) times daily as needed for rhinitis. 03/19/18 12/23/20  [provider]  ?levothyroxine (SYNTHROID, LEVOTHROID) 50 MCG tablet Take 50 mcg by mouth daily before breakfast.    [provider]  ?OXYGEN Inhale 2 L into the lungs daily as needed (shortness of breath).    [provider]  ?Psyllium (METAMUCIL) 0.36 g CAPS Take 1 capsule by mouth at bedtime.    [provider]  ?sennosides-docusate sodium (SENOKOT-S) 8.6-50 MG tablet Take 1 tablet by mouth daily as needed for constipation.    [provider]  ?sertraline (ZOLOFT) 25 MG tablet Take 1 tablet (25 mg total) by mouth daily. ?Patient not taking: No sig reported 11/09/18   Rainey Pines, MD  ?sertraline (ZOLOFT) 50 MG tablet Take 50 mg by mouth at bedtime.    [provider]  ?simethicone (MYLICON) 80 MG chewable tablet Chew 80 mg by mouth at bedtime as needed for flatulence.    [provider]  ?sodium chloride 1 g tablet Take 1 g by mouth at bedtime.  [provider]  ?tamsulosin (FLOMAX) 0.4 MG CAPS capsule TAKE 1 CAPSULE EVERY DAY ?Patient not taking: No sig reported 07/14/19   Debroah Loop, PA-C  ?tobramycin, PF, (TOBI) 300 MG/5ML nebulizer solution Take 300 mg by nebulization daily as needed (shortness of breath).    [provider]  ?zinc oxide 20 % ointment Apply 1 application topically 4 (four) times daily as needed for irritation.    [provider]  ?   ? ?Allergies    ?Ciprofloxacin, Elemental sulfur, Erythromycin, Metronidazole, Tizanidine, Penicillins, and Sulfamethoxazole   ? ?Review of Systems   ?Review of Systems  ?All  other systems reviewed and are negative. ? ?Physical Exam ?Updated Vital Signs ?BP 119/73 (BP Location: Left Arm)   Pulse 89   Temp 98 ?F (36.7 ?C)   Resp 18   Ht 1.651 m (5\' 5" )   Wt 50.3 kg   SpO2 98%   BMI 18.45 kg/m?  ?Physical Exam ?Vitals and nursing note reviewed.  ?Constitutional:   ?   General: She is not in acute distress. ?   Appearance: Normal appearance. She is well-developed. She is not toxic-appearing.  ?HENT:  ?   Head: Normocephalic and atraumatic.  ?Eyes:  ?   General: Lids are normal.  ?   Conjunctiva/sclera: Conjunctivae normal.  ?   Pupils: Pupils are equal, round, and reactive to light.  ?Neck:  ?   Thyroid: No thyroid mass.  ?   Trachea: No tracheal deviation.  ?Cardiovascular:  ?   Rate and Rhythm: Normal rate and regular rhythm.  ?   Heart sounds: Normal heart sounds. No murmur heard. ?  No gallop.  ?Pulmonary:  ?   Effort: Pulmonary effort is normal. No respiratory distress.  ?   Breath sounds: Normal breath sounds. No stridor. No decreased breath sounds, wheezing, rhonchi or rales.  ?Abdominal:  ?   General: There is no distension.  ?   Palpations: Abdomen is soft.  ?   Tenderness: There is no abdominal tenderness. There is no rebound.  ?Musculoskeletal:     ?   General: No tenderness. Normal range of motion.  ?   Cervical back: Normal range of motion and neck supple.  ?Skin: ?   General: Skin is warm and dry.  ?   Findings: No abrasion or rash.  ?Neurological:  ?   Mental Status: She is alert and oriented to person, place, and time. Mental status is at baseline.  ?   GCS: GCS eye subscore is 4. GCS verbal subscore is 5. GCS motor subscore is 6.  ?   Cranial Nerves: No cranial nerve deficit.  ?   Sensory: No sensory deficit.  ?   Motor: Motor function is intact.  ?Psychiatric:     ?   Attention and Perception: Attention normal.     ?   Speech: Speech normal.     ?   Behavior: Behavior normal.  ? ? ?ED Results / Procedures / Treatments   ?Labs ?(all labs ordered are listed, but only  abnormal results are displayed) ?Labs Reviewed  ?RESP PANEL BY RT-PCR (FLU A&B, COVID) [id number]  ? ? ?EKG ?None ? ?Radiology ?DG Chest Port 1 View ? ?Result Date: 04/26/2021 ?CLINICAL DATA:  88 year old female with productive cough. History of MAI infection. EXAM: PORTABLE CHEST 1 VIEW COMPARISON:  Chest radiographs 12/22/2020 and earlier. FINDINGS: Portable AP semi upright view at 1200 hours. Some evidence of chronic pulmonary hyperinflation, and coarse mildly asymmetric increased pulmonary  interstitial markings appear chronic and stable. Postinflammatory calcified left hilar lymph nodes are stable. Normal cardiac size and mediastinal contours. Visualized tracheal air column is within normal limits. No pneumothorax or acute pulmonary opacity. Chronic right costophrenic angle blunting. Chronic bilateral chest wall and right axillary surgical clips. Cholecystectomy clips. Visible bowel-gas pattern within normal limits. No acute osseous abnormality identified. IMPRESSION: Chronic inflammatory/postinflammatory lung changes. No acute cardiopulmonary abnormality. Electronically Signed   By: Genevie Ann M.D.   On: 04/26/2021 12:07   ? ?Procedures ?Procedures  ? ? ?Medications Ordered in ED ?Medications - No data to display ? ?ED Course/ Medical Decision Making/ A&P ?  ?                        ?Medical Decision Making ?Amount and/or Complexity of Data Reviewed ?Radiology: ordered. ? ? ?Patient's chest x-ray without acute findings here per my interpretation and review.  COVID and flu negative here.  Suspect that patient may have a early URI.  She may have some element of worsening chronic bronchitis.  Will place patient on prednisone and return precautions given.  Case discussed with her daughter who was in her room and agrees with this. ? ? ? ? ? ? ? ?Final Clinical Impression(s) / ED Diagnoses ?Final diagnoses:  ?Cough  ? ? ?Rx / DC Orders ?ED Discharge Orders   ? ? None  ? ?  ? ? ?  ?Lacretia Leigh, MD ?04/26/21 1240 ? ?

## 2021-05-03 ENCOUNTER — Ambulatory Visit: Payer: Medicare PPO | Admitting: Gastroenterology

## 2021-05-03 ENCOUNTER — Encounter: Payer: Self-pay | Admitting: Gastroenterology

## 2021-05-03 VITALS — BP 122/70 | HR 88 | Ht 66.0 in | Wt 107.5 lb

## 2021-05-03 DIAGNOSIS — R634 Abnormal weight loss: Secondary | ICD-10-CM | POA: Diagnosis not present

## 2021-05-03 DIAGNOSIS — R1310 Dysphagia, unspecified: Secondary | ICD-10-CM

## 2021-05-03 DIAGNOSIS — K5900 Constipation, unspecified: Secondary | ICD-10-CM

## 2021-05-03 NOTE — Progress Notes (Signed)
HPI :  Christine Vaughan is a very pleasant 88 year old female with a history of anxiety, breast cancer and chronic bronchitis/MAC pneumonia who is referred to [place] by Dr. Reymundo Poll for further evaluation of dysphagia and weight loss.  She has had progressive dysphagia for about a year now.  She has symptoms of both oropharyngeal dysphagia and esophageal dysphagia.  She underwent a barium esophagram and modified barium swallow with SLP evaluation last summer.  The esophagram demonstrated a distal esophageal stenosis and retention of the 13 mm barium tablet.  No mucosal irregularities were seen to suggest an underlying mass lesion or significant esophagitis.  Her MBS was also notable for some moderate pharyngeal dysphagia but she was cleared to continue thin liquids. Over the past several months, the patient has continued to have poor PO intake which seems primarily due to esophageal dysphagia, although obtaining an accurate history is difficult.  The patient was accompanied by her two daughters today and the histories varied between what the patient and the daughters reported.  The patient's symptoms may also be confounded by a high degree of anxiety which both she and her daughters admit.  What is clear is that the patient takes a very long time to eat and has to take very small bites.  It seems she does have the sensation of food sitting in her chest, but she does not have any episodes of having vomit back food which is stuck in her esophagus.  No denies nausea.  She reports a normal appetite, but it seems she just can't eat enough to sustain her weight.  Her daughters report a 30 lb weight loss in the past year.  Review of her vitals show that she weights 107lbs, down from 127lbs a year ago. Her BMI is now 17.  She denies symptoms of heartburn or acid regurgitation.  She reports a remote history of an esophageal stricture that was dilated (30 years ago).  Her last EGD was in 2017 and no stricture or  esophagitis was seen.  She is taking Nexium. She also has problems with chronic constipation.  She was taking Metamucil which she thought worked pretty well, but was told to stop taking because she wasn't drinking it fast enough for it to work?  Past Medical History:  Diagnosis Date   Anxiety    Breast cancer, right ([place]) 2001   Right Lumpectomy, chemo + rad tx's.    Chronic kidney disease    Hereditary and idiopathic peripheral neuropathy 12/27/2015   Hypertension    MAI (mycobacterium avium-intracellulare) ([place])    Thyroid disease    EGD: July 2017, performed by Dr. Gustavo Lah at [facility].  The EGD was notable for a normal esophagus except for an irregular Z-line.  No stricture or stenosis was reported.  Several small polyps in the stomach were biopsied and there was abnormal appearing duodenal mucosa that was biopsied (normal biopsies).  Biopsies of the GEJ showed evidence of chronic reflux, but no Barrett's esophagus or dysplasia.  Past Surgical History:  Procedure Laterality Date   BACK SURGERY     BREAST BIOPSY Left    surgical bx pt dose not remember   BREAST EXCISIONAL BIOPSY Right 2001   + chem rad and armidex   BREAST LUMPECTOMY  2001   BREAST SURGERY     CHOLECYSTECTOMY     ESOPHAGOGASTRODUODENOSCOPY (EGD) WITH PROPOFOL N/A 09/20/2015   Procedure: ESOPHAGOGASTRODUODENOSCOPY (EGD) WITH PROPOFOL;  Surgeon: Lollie Sails, MD;  Location: [facility]  ENDOSCOPY;  Service: Endoscopy;  Laterality: N/A;   Family History  Problem Relation Age of Onset   Depression Mother    Brain cancer Mother    Breast cancer Paternal Aunt    Social History   Tobacco Use   Smoking status: Never   Smokeless tobacco: Never  Vaping Use   Vaping Use: Never used  Substance Use Topics   Alcohol use: No   Drug use: No   Current Outpatient Medications  Medication Sig Dispense Refill   acetaminophen (TYLENOL) 500 MG tablet Take 500 mg by mouth every 6 (six) hours as needed for moderate pain  or headache.     albuterol (VENTOLIN HFA) 108 (90 Base) MCG/ACT inhaler Inhale 2 puffs into the lungs every 6 (six) hours as needed for wheezing or shortness of breath.     ALPRAZolam (XANAX) 0.25 MG tablet Take 0.125 mg by mouth 2 (two) times daily as needed for anxiety.     aspirin EC 81 MG tablet Take 81 mg by mouth at bedtime. Swallow whole.     docusate sodium (COLACE) 100 MG capsule Take 100 mg by mouth daily.     esomeprazole (NEXIUM) 40 MG capsule Take 40 mg by mouth daily at 4 PM.     gabapentin (NEURONTIN) 100 MG capsule Take 100-200 mg by mouth See admin instructions. 100 mg in the morning 200 mg at bedtime     levothyroxine (SYNTHROID, LEVOTHROID) 50 MCG tablet Take 50 mcg by mouth daily before breakfast.     OXYGEN Inhale 2 L into the lungs daily as needed (shortness of breath).     sennosides-docusate sodium (SENOKOT-S) 8.6-50 MG tablet Take 1 tablet by mouth daily as needed for constipation.     sertraline (ZOLOFT) 25 MG tablet Take 1 tablet (25 mg total) by mouth daily. 30 tablet 3   simethicone (MYLICON) 80 MG chewable tablet Chew 80 mg by mouth at bedtime as needed for flatulence.     sodium chloride 1 g tablet Take 1 g by mouth at bedtime.     tobramycin, PF, (TOBI) 300 MG/5ML nebulizer solution Take 300 mg by nebulization daily as needed (shortness of breath).     zinc oxide 20 % ointment Apply 1 application topically 4 (four) times daily as needed for irritation.     EZALLOR SPRINKLE 10 MG CPSP Take 1 capsule by mouth at bedtime. Open and sprinkle in applesauce     ipratropium (ATROVENT) 0.06 % nasal spray Place 2 sprays into the nose 2 (two) times daily as needed for rhinitis.     Psyllium (METAMUCIL) 0.36 g CAPS Take 1 capsule by mouth at bedtime. (Patient not taking: Reported on 05/03/2021)     No current facility-administered medications for this visit.   Allergies  Allergen Reactions   Ciprofloxacin Other (See Comments)    aggravates her neuropathy   Elemental Sulfur  Other (See Comments)    Doesn't remember   Erythromycin Other (See Comments)    Gi distress   Metronidazole     Other reaction(s): Abdominal Pain   Tizanidine Other (See Comments)    GI upset   Penicillins Rash    Has patient had a PCN reaction causing immediate rash, facial/tongue/throat swelling, SOB or lightheadedness with hypotension: No Has patient had a PCN reaction causing severe rash involving mucus membranes or skin necrosis: No Has patient had a PCN reaction that required hospitalization No Has patient had a PCN reaction occurring within the last 10 years: no If  all of the above answers are "NO", then may proceed with Cephalosporin use.   Sulfamethoxazole Rash    all over body     Review of Systems: All systems reviewed and negative except where noted in HPI.    DG Chest Port 1 View  Result Date: 04/26/2021 CLINICAL DATA:  88 year old female with productive cough. History of MAI infection. EXAM: PORTABLE CHEST 1 VIEW COMPARISON:  Chest radiographs 12/22/2020 and earlier. FINDINGS: Portable AP semi upright view at 1200 hours. Some evidence of chronic pulmonary hyperinflation, and coarse mildly asymmetric increased pulmonary interstitial markings appear chronic and stable. Postinflammatory calcified left hilar lymph nodes are stable. Normal cardiac size and mediastinal contours. Visualized tracheal air column is within normal limits. No pneumothorax or acute pulmonary opacity. Chronic right costophrenic angle blunting. Chronic bilateral chest wall and right axillary surgical clips. Cholecystectomy clips. Visible bowel-gas pattern within normal limits. No acute osseous abnormality identified. IMPRESSION: Chronic inflammatory/postinflammatory lung changes. No acute cardiopulmonary abnormality. Electronically Signed   By: Genevie Ann M.D.   On: 04/26/2021 12:07    CLINICAL DATA:  Dysphagia with difficulty swallowing foods and pills. History of esophageal dilation 30 years prior.    EXAM: ESOPHOGRAM/BARIUM SWALLOW   TECHNIQUE: Single contrast examination was performed using  thin barium.   FLUOROSCOPY TIME:  Fluoroscopy Time:  2 minutes 36 seconds   Radiation Exposure Index (if provided by the fluoroscopic device): 7.9 mGy   Number of Acquired Spot Images: 3   COMPARISON:  05/12/2020 chest CT angiogram.   FINDINGS: Limited exam. Patient unable to stand for prolonged periods, so exam conducted in LPO position with mild head elevation.   No gross laryngeal penetration or tracheobronchial aspiration observed. There is mild smooth tapering of the lower thoracic esophagus extending to the esophagogastric junction, at which location the swallowed 13 mm barium tablet became lodged despite multiple barium and water swallows. Findings are suggestive of a benign peptic stricture in this location. No discrete esophageal masses or ulcers. Otherwise normal esophageal distensibility. Esophageal motility not well evaluated given patient mobility restrictions. No gastroesophageal reflux observed.   IMPRESSION: Findings are suggestive of a benign peptic stricture in the lower thoracic esophagus extending to the esophagogastric junction, see comments. No discrete esophageal mass. No gastroesophageal reflux observed.     Electronically Signed   By: Ilona Sorrel M.D.   On: 07/19/2020 14:12  Physical Exam: BP 122/70 (BP Location: Left Arm, Patient Position: Sitting, Cuff Size: Normal)    Pulse 88    Ht _0  (1.676 m)    Wt 107 lb 8 oz (48.8 kg)    SpO2 93%    BMI 17.35 kg/m  Constitutional: Pleasant, cachectic/sarcopenic, Caucasian female in no acute distress, seated in wheelchair.  Accompanied by two daughters who provide much of the history HEENT: Normocephalic and atraumatic. Conjunctivae are normal. No scleral icterus. Neck supple.  Cardiovascular: Normal rate, regular rhythm.  Pulmonary/chest: Effort normal and breath sounds normal. No wheezing, rales or  rhonchi. Abdominal: Soft, nondistended, nontender. Bowel sounds active throughout. There are no masses palpable. No hepatomegaly. Extremities: no edema Neurological: Alert and oriented to person place and time. Skin: Skin is warm and dry. No rashes noted. Psychiatric: Normal mood and affect. Behavior is normal.  Patient seems very anxious and at times tearful and overwhelmed  CBC    Component Value Date/Time   WBC 5.0 12/22/2020 1639   RBC 3.49 (L) 12/22/2020 1639   HGB 11.0 (L) 12/22/2020 1639   HCT 33.9 (L)  12/22/2020 1639   PLT 206 12/22/2020 1639   MCV 97.1 12/22/2020 1639   MCH 31.5 12/22/2020 1639   MCHC 32.4 12/22/2020 1639   RDW 15.1 12/22/2020 1639   LYMPHSABS 0.6 (L) 12/22/2020 1639   MONOABS 0.4 12/22/2020 1639   EOSABS 0.1 12/22/2020 1639   BASOSABS 0.1 12/22/2020 1639    CMP     Component Value Date/Time   NA 135 12/22/2020 1639   K 3.8 12/22/2020 1639   CL 100 12/22/2020 1639   CO2 24 12/22/2020 1639   GLUCOSE 91 12/22/2020 1639   BUN 19 12/22/2020 1639   CREATININE 1.05 (H) 12/22/2020 1639   CREATININE 1.19 07/03/2012 1305   CALCIUM 9.3 12/22/2020 1639   PROT 6.8 12/22/2020 1639   ALBUMIN 3.9 12/22/2020 1639   AST 19 12/22/2020 1639   ALT 9 12/22/2020 1639   ALKPHOS 57 12/22/2020 1639   BILITOT 1.1 12/22/2020 1639   GFRNONAA 52 (L) 12/22/2020 1639   GFRNONAA 44 (L) 07/03/2012 1305   GFRAA 36 (L) 10/13/2019 0041   GFRAA 51 (L) 07/03/2012 1305     ASSESSMENT AND PLAN: 88 year old female with anxiety, chronic bronchiectasis/MAI infection with limited functional status, with chronic dysphagia and progressive weight loss, now with BMI 17.  Esophagram a year ago suggestive of peptic stricture causing retention of barium tablet.  Also with pharyngeal dysphagia.  Suspect patient's dysphagia and weight loss is multifactorial with esophageal dysphagia, pharyngeal dysphagia and anxiety all contributing to her poor PO intake and weight loss.  I think dilation of  the esophageal stricture would definitely provide some benefit, although it may not completely alleviate all her swallowing problems.  The patient understands she is a high procedural risk, but also knows that her currently level of PO intake is not sustainable and more aggressive interventions are needed.  I think she would benefit from a nutrition referral to try to maximize her calories/nutrition with what she can tolerate orally. Given the patient's advanced age, very frail state and chronic lung disease, I think her procedure would be best performed in the hospital setting.  Dysphagia, weight loss, multifactorial - EGD with dilation in hospital - Nutrition referral - Continue Nexium  Constipation - Resume metamucil (patient felt it worked) - PRN dulcolax  The details, risks (including bleeding, perforation, infection, missed lesions, medication reactions and possible hospitalization or surgery if complications occur), benefits, and alternatives to EGD with possible biopsy and possible dilation were discussed with the patient and she consents to proceed.   Dorthy Hustead E. Candis Schatz, MD [facility]  CC:  Reymundo Poll, MD

## 2021-05-03 NOTE — Patient Instructions (Addendum)
If you are age 86 or older, your body mass index should be between 23-30. Your Body mass index is 17.35 kg/m?[name] If this is out of the aforementioned range listed, please consider follow up with your Primary Care Provider. ? ?If you are age 42 or younger, your body mass index should be between 19-25. Your Body mass index is 17.35 kg/m?[name] If this is out of the aformentioned range listed, please consider follow up with your Primary Care Provider.  ? ?You have been scheduled for an endoscopy. Please follow written instructions given to you at your visit today. ?If you use inhalers (even only as needed), please bring them with you on the day of your procedure. ? ? ?Continue Metamucil in 8 ounces of water or orange juice as needed for constipation. Take with Doculax as needed. ? ?Continue Nexium.  ? ?We have sent a referral to nutrition and the will contact to to schedule an appointment. ? ?The [facility] providers would like to encourage you to use [facility] to communicate with providers for non-urgent requests or questions.  Due to long hold times on the telephone, sending your provider a message by [facility] may be a faster and more efficient way to get a response.  Please allow 48 business hours for a response.  Please remember that this is for non-urgent requests.  ? ?It was a pleasure to see you today! ? ?Thank you for trusting me with your gastrointestinal care!   ? ?Scott E.Candis Schatz, MD  ? ?

## 2021-05-05 ENCOUNTER — Encounter: Payer: Self-pay | Admitting: Gastroenterology

## 2021-05-11 ENCOUNTER — Other Ambulatory Visit (HOSPITAL_BASED_OUTPATIENT_CLINIC_OR_DEPARTMENT_OTHER): Payer: Self-pay

## 2021-05-11 ENCOUNTER — Encounter (HOSPITAL_BASED_OUTPATIENT_CLINIC_OR_DEPARTMENT_OTHER): Payer: Self-pay

## 2021-05-11 ENCOUNTER — Emergency Department (HOSPITAL_BASED_OUTPATIENT_CLINIC_OR_DEPARTMENT_OTHER): Payer: Medicare PPO

## 2021-05-11 ENCOUNTER — Other Ambulatory Visit: Payer: Self-pay

## 2021-05-11 ENCOUNTER — Emergency Department (HOSPITAL_BASED_OUTPATIENT_CLINIC_OR_DEPARTMENT_OTHER)
Admission: EM | Admit: 2021-05-11 | Discharge: 2021-05-11 | Disposition: A | Discharge status: Home / Self Care | Payer: Medicare PPO | Attending: Emergency Medicine | Admitting: Emergency Medicine

## 2021-05-11 DIAGNOSIS — R079 Chest pain, unspecified: Secondary | ICD-10-CM | POA: Diagnosis not present

## 2021-05-11 DIAGNOSIS — Z7982 Long term (current) use of aspirin: Secondary | ICD-10-CM | POA: Diagnosis not present

## 2021-05-11 DIAGNOSIS — R0602 Shortness of breath: Secondary | ICD-10-CM | POA: Diagnosis not present

## 2021-05-11 DIAGNOSIS — A310 Pulmonary mycobacterial infection: Secondary | ICD-10-CM

## 2021-05-11 DIAGNOSIS — R042 Hemoptysis: Secondary | ICD-10-CM | POA: Diagnosis present

## 2021-05-11 DIAGNOSIS — A312 Disseminated mycobacterium avium-intracellulare complex (DMAC): Secondary | ICD-10-CM | POA: Insufficient documentation

## 2021-05-11 LAB — CBC WITH DIFFERENTIAL/PLATELET
Abs Immature Granulocytes: 0.01 10*3/uL (ref 0.00–0.07)
Basophils Absolute: 0 10*3/uL (ref 0.0–0.1)
Basophils Relative: 1 %
Eosinophils Absolute: 0.1 10*3/uL (ref 0.0–0.5)
Eosinophils Relative: 2 %
HCT: 32.5 % — ABNORMAL LOW (ref 36.0–46.0)
Hemoglobin: 10.5 g/dL — ABNORMAL LOW (ref 12.0–15.0)
Immature Granulocytes: 0 %
Lymphocytes Relative: 12 %
Lymphs Abs: 0.6 10*3/uL — ABNORMAL LOW (ref 0.7–4.0)
MCH: 30.7 pg (ref 26.0–34.0)
MCHC: 32.3 g/dL (ref 30.0–36.0)
MCV: 95 fL (ref 80.0–100.0)
Monocytes Absolute: 0.5 10*3/uL (ref 0.1–1.0)
Monocytes Relative: 10 %
Neutro Abs: 3.8 10*3/uL (ref 1.7–7.7)
Neutrophils Relative %: 75 %
Platelets: 250 10*3/uL (ref 150–400)
RBC: 3.42 MIL/uL — ABNORMAL LOW (ref 3.87–5.11)
RDW: 14 % (ref 11.5–15.5)
WBC: 5.1 10*3/uL (ref 4.0–10.5)
nRBC: 0 % (ref 0.0–0.2)

## 2021-05-11 LAB — COMPREHENSIVE METABOLIC PANEL
ALT: 6 U/L (ref 0–44)
AST: 15 U/L (ref 15–41)
Albumin: 3.8 g/dL (ref 3.5–5.0)
Alkaline Phosphatase: 60 U/L (ref 38–126)
Anion gap: 7 (ref 5–15)
BUN: 23 mg/dL (ref 8–23)
CO2: 27 mmol/L (ref 22–32)
Calcium: 9.4 mg/dL (ref 8.9–10.3)
Chloride: 98 mmol/L (ref 98–111)
Creatinine, Ser: 0.84 mg/dL (ref 0.44–1.00)
GFR, Estimated: >60 mL/min (ref >60)
Glucose, Bld: 80 mg/dL (ref 70–99)
Potassium: 3.9 mmol/L (ref 3.5–5.1)
Sodium: 132 mmol/L — ABNORMAL LOW (ref 135–145)
Total Bilirubin: 0.6 mg/dL (ref 0.3–1.2)
Total Protein: 6.7 g/dL (ref 6.5–8.1)

## 2021-05-11 MED ORDER — LEVOFLOXACIN 500 MG PO TABS
500.0000 mg | ORAL_TABLET | Freq: Once | ORAL | Status: AC
  Administered 2021-05-11: 500 mg via ORAL
  Filled 2021-05-11: qty 1

## 2021-05-11 MED ORDER — IPRATROPIUM BROMIDE 0.02 % IN SOLN
0.5000 mg | Freq: Once | RESPIRATORY_TRACT | Status: AC
  Administered 2021-05-11: 0.5 mg via RESPIRATORY_TRACT
  Filled 2021-05-11: qty 2.5

## 2021-05-11 MED ORDER — IOHEXOL 350 MG/ML SOLN
100.0000 mL | Freq: Once | INTRAVENOUS | Status: AC | PRN
  Administered 2021-05-11: 75 mL via INTRAVENOUS

## 2021-05-11 MED ORDER — LEVOFLOXACIN 500 MG PO TABS
500.0000 mg | ORAL_TABLET | Freq: Every day | ORAL | 0 refills | Status: DC
  Filled 2021-05-11: qty 6, 6d supply, fill #0

## 2021-05-11 MED ORDER — ALBUTEROL SULFATE (2.5 MG/3ML) 0.083% IN NEBU
5.0000 mg | INHALATION_SOLUTION | Freq: Once | RESPIRATORY_TRACT | Status: AC
  Administered 2021-05-11: 5 mg via RESPIRATORY_TRACT
  Filled 2021-05-11 (×2): qty 6

## 2021-05-11 NOTE — Discharge Instructions (Addendum)
You will most likely have some blood tinged sputum over the next 24 hours however if you start having massive blood coming up when you cough like you did in the past you need to return to an emergency room.  Spoke with your pulmonary specialist today and he wanted to start you on an antibiotic for the next 7 days and they will see you in that timeframe to make sure things are getting better.  Continue using your tobramycin inhaler.  Continue to use your albuterol as needed every 4-6 hours. ?

## 2021-05-11 NOTE — ED Provider Notes (Signed)
?[facility] ?Provider Note ? ? ?CSN: [id number] ?Arrival date & time: 05/11/21  1117 ? ?  ? ?History ? ?Chief Complaint  ?Patient presents with  ? Hemoptysis  ? ? ?Christine Vaughan is a 88 y.o. female. ? ?Pt is a 88y/o female with hx of bronchiectasis due to chronic MAC infection on nebulized tobramycin but no systemic therapy due to side effects and history of pulmonary thromboembolism who is presenting today for hemoptysis.  Patient reports yesterday was a great day.  Her breathing felt good and she was in her normal state of health.  However last night after she used one of her inhalers she reports normally she is able to cough up some sputum but last night she coughed up bright red blood.  It was just a little bit so she went to bed however this morning around 6 AM she coughed numerous times and coughed up blood clots and dark blood.  The last time she is coughed up any blood was this morning when she went to go brush her teeth.  She is complaining of feeling short of breath but denies any chest pain.  She does not take any anticoagulation at this time.  She has not had fever or changes in her sputum.  She denies any abdominal pain, vomiting or diarrhea.  She has not noticed any nosebleeds or dental bleeding. ? ? ? ? ?  ? ?Home Medications ?Prior to Admission medications   ?Medication Sig Start Date End Date Taking? Authorizing Provider  ?levofloxacin (LEVAQUIN) 500 MG tablet Take 1 tablet (500 mg total) by mouth daily. Start taking tomorrow 05/12/21 05/11/21  Yes Blanchie Dessert, MD  ?acetaminophen (TYLENOL) 500 MG tablet Take 500 mg by mouth every 6 (six) hours as needed for moderate pain or headache.    [provider]  ?albuterol (VENTOLIN HFA) 108 (90 Base) MCG/ACT inhaler Inhale 2 puffs into the lungs every 6 (six) hours as needed for wheezing or shortness of breath.    [provider]  ?ALPRAZolam Duanne Moron) 0.25 MG tablet Take 0.125 mg by mouth 2 (two) times  daily as needed for anxiety.    [provider]  ?aspirin EC 81 MG tablet Take 81 mg by mouth at bedtime. Swallow whole.    [provider]  ?docusate sodium (COLACE) 100 MG capsule Take 100 mg by mouth daily.    [provider]  ?esomeprazole (NEXIUM) 40 MG capsule Take 40 mg by mouth daily at 4 PM. 12/24/16   [provider]  ?EZALLOR SPRINKLE 10 MG CPSP Take 1 capsule by mouth at bedtime. Open and sprinkle in applesauce 12/11/20   [provider]  ?gabapentin (NEURONTIN) 100 MG capsule Take 100-200 mg by mouth See admin instructions. 100 mg in the morning ?200 mg at bedtime    [provider]  ?ipratropium (ATROVENT) 0.06 % nasal spray Place 2 sprays into the nose 2 (two) times daily as needed for rhinitis. 03/19/18 12/23/20  [provider]  ?levothyroxine (SYNTHROID, LEVOTHROID) 50 MCG tablet Take 50 mcg by mouth daily before breakfast.    [provider]  ?OXYGEN Inhale 2 L into the lungs daily as needed (shortness of breath).    [provider]  ?Psyllium (METAMUCIL) 0.36 g CAPS Take 1 capsule by mouth at bedtime. ?Patient not taking: Reported on 05/03/2021    [provider]  ?sennosides-docusate sodium (SENOKOT-S) 8.6-50 MG tablet Take 1 tablet by mouth daily as needed for constipation.  [provider]  ?sertraline (ZOLOFT) 25 MG tablet Take 1 tablet (25 mg total) by mouth daily. 11/09/18   Rainey Pines, MD  ?simethicone (MYLICON) 80 MG chewable tablet Chew 80 mg by mouth at bedtime as needed for flatulence.    [provider]  ?sodium chloride 1 g tablet Take 1 g by mouth at bedtime.    [provider]  ?tobramycin, PF, (TOBI) 300 MG/5ML nebulizer solution Take 300 mg by nebulization daily as needed (shortness of breath).    [provider]  ?zinc oxide 20 % ointment Apply 1 application topically 4 (four) times daily as needed for irritation.    [provider]  ?    ? ?Allergies    ?Ciprofloxacin, Elemental sulfur, Erythromycin, Metronidazole, Tizanidine, Penicillins, and Sulfamethoxazole   ? ?Review of Systems   ?Review of Systems ? ?Physical Exam ?Updated Vital Signs ?BP 114/68   Pulse (!) 102   Temp 98.8 ?F (37.1 ?C)   Resp (!) 24   SpO2 95%  ?Physical Exam ?Vitals and nursing note reviewed.  ?Constitutional:   ?   General: She is not in acute distress. ?   Appearance: She is well-developed. She is ill-appearing.  ?   Comments: Chronically ill-appearing  ?HENT:  ?   Head: Normocephalic and atraumatic.  ?   Nose: Nose normal.  ?   Mouth/Throat:  ?   Mouth: Mucous membranes are dry.  ?Eyes:  ?   Conjunctiva/sclera: Conjunctivae normal.  ?   Pupils: Pupils are equal, round, and reactive to light.  ?Cardiovascular:  ?   Rate and Rhythm: Normal rate and regular rhythm.  ?   Heart sounds: No murmur heard. ?Pulmonary:  ?   Effort: Pulmonary effort is normal. Tachypnea present. No respiratory distress.  ?   Breath sounds: Wheezing and rhonchi present. No rales.  ?Abdominal:  ?   General: There is no distension.  ?   Palpations: Abdomen is soft.  ?   Tenderness: There is no abdominal tenderness. There is no guarding or rebound.  ?Musculoskeletal:     ?   General: No tenderness. Normal range of motion.  ?   Cervical back: Normal range of motion and neck supple.  ?   Comments: Trace edema at the ankles  ?Skin: ?   General: Skin is warm and dry.  ?   Findings: No erythema or rash.  ?Neurological:  ?   Mental Status: She is alert and oriented to person, place, and time. Mental status is at baseline.  ?Psychiatric:     ?   Mood and Affect: Mood normal.     ?   Behavior: Behavior normal.  ? ? ?ED Results / Procedures / Treatments   ?Labs ?(all labs ordered are listed, but only abnormal results are displayed) ?Labs Reviewed  ?CBC WITH DIFFERENTIAL/PLATELET - Abnormal; Notable for the following components:  ?    Result Value  ? RBC 3.42 (*)   ? Hemoglobin 10.5 (*)   ? HCT 32.5 (*)   ?  Lymphs Abs 0.6 (*)   ? All other components within normal limits  ?COMPREHENSIVE METABOLIC PANEL - Abnormal; Notable for the following components:  ? Sodium 132 (*)   ? All other components within normal limits  ? ? ?EKG ?EKG Interpretation ? ?Date/Time:  Friday May 11 2021 12:22:58 EDT ?Ventricular Rate:  88 ?PR Interval:  146 ?QRS Duration: 109 ?QT Interval:  369 ?QTC Calculation: 447 ?R Axis:   -54 ?Text Interpretation:  Sinus rhythm Probable left atrial enlargement Left anterior fascicular block Abnormal R-wave progression, late transition Left ventricular hypertrophy ST elevation, consider inferior injury No significant change since last tracing Confirmed by Blanchie Dessert [phone]) on 05/11/2021 12:40:01 PM ? ?Radiology ?CT Angio Chest PE W and/or Wo Contrast ? ?Result Date: 05/11/2021 ?CLINICAL DATA:  Chest pain or SOB, pleurisy or effusion suspected hemoptysis with hx of MAC,PE and bronchiectasis EXAM: CT ANGIOGRAPHY CHEST WITH CONTRAST TECHNIQUE: Multidetector CT imaging of the chest was performed using the standard protocol during bolus administration of intravenous contrast. Multiplanar CT image reconstructions and MIPs were obtained to evaluate the vascular anatomy. RADIATION DOSE REDUCTION: This exam was performed according to the departmental dose-optimization program which includes automated exposure control, adjustment of the mA and/or kV according to patient size and/or use of iterative reconstruction technique. CONTRAST:  76m OMNIPAQUE IOHEXOL 350 MG/ML SOLN COMPARISON:  CT 03/12/2018. FINDINGS: Cardiovascular: Satisfactory opacification of the pulmonary arteries to the segmental level. No evidence of pulmonary embolism. Normal heart size. No pericardial effusion. Mediastinum/Nodes: No lymphadenopathy. The thyroid is unremarkable. The trachea is unremarkable. The esophagus is unremarkable. Lungs/Pleura: Mid to lower lung predominant bronchiectasis and mucous plugging with centrilobular nodules.  Scarring atelectasis in the right middle lobe and lingula. Increased mucoid impaction in the lung bases in comparison to recent CT abdomen and pelvis. Mild improvement in the mid to upper lungs in comparison to prior

## 2021-05-11 NOTE — ED Triage Notes (Signed)
Pt here P[facility] from [facility]. Per daughter, pt has significant hx of cough and "lung issues", requires PRN O2, albuterol inhaler and neb treatments. Pt began coughing up blood last night, unable to get any phlegm up. Reports coughing up blood clots this am. Daughter brought 2 specimen cups with said clots in them. Pt speaking in fragmented sentences, able to talk but takes breaks in between. Pt reports her speech is hoarse. Pt uses wheel chair for long distance and walker for daily ADL's.   ?

## 2021-05-31 ENCOUNTER — Non-Acute Institutional Stay: Payer: Medicare PPO | Admitting: Family Medicine

## 2021-05-31 DIAGNOSIS — E860 Dehydration: Secondary | ICD-10-CM

## 2021-05-31 DIAGNOSIS — R1310 Dysphagia, unspecified: Secondary | ICD-10-CM

## 2021-05-31 DIAGNOSIS — E871 Hypo-osmolality and hyponatremia: Secondary | ICD-10-CM

## 2021-05-31 DIAGNOSIS — Z515 Encounter for palliative care: Secondary | ICD-10-CM

## 2021-05-31 DIAGNOSIS — A310 Pulmonary mycobacterial infection: Secondary | ICD-10-CM

## 2021-05-31 NOTE — Progress Notes (Signed)
? ? ?[facility] ?[facility] Consult Note ?Telephone: [phone]  ?Fax: [phone]  ? ?Date of encounter: 05/31/21 ?9:40 AM ?PATIENT NAME: Christine Vaughan ?[address] ?[place] [address]   ?[phone] (home)  ?DOB: [birth year] ?MRN: [id number] ?PRIMARY CARE PROVIDER:    ?Christine Poll, MD,  ?[address] ?[place] [address] ?[phone] ? ?REFERRING PROVIDER:   ?Christine Poll, MD ?[address] ?[address] ?[place],  [place] [address] ?[phone] ? ?RESPONSIBLE PARTY:    ?Contact Information   ? ? Name Relation Home Work Mobile  ? Christine Vaughan Daughter [phone]  [phone]  ? Christine Vaughan Daughter [phone]  [phone]  ? Christine Vaughan Daughter [phone]    ? ?  ? ? ? ?I met face to face with patient in her ALF and spoke with daughter Christine Vaughan by phone while at the facility. Palliative Care was asked to follow this patient by consultation request of  Christine Poll, MD to address advance care planning and complex medical decision making. This is the initial visit.  ? ? ?      ASSESSMENT, SYMPTOM MANAGEMENT AND PLAN / RECOMMENDATIONS:  ? Dysphagia, unspecified ?Scheduled for EGD with esophageal dilatation 06/28/21 ?Referral to [facility] for speech therapy to eval and treat with suggestions for improved swallow, dietary suggestions for improved swallow. ?Upright for all intake and for 30-60 minutes after. ?Crush any possible meds and give in applesauce or pudding. ?D/c Colace and change to Sennokot 1 po daily as pt can swallow. ? ? Dehydration ?[facility] referral to obtain stat labs:  CBC, CMP, CK, clean catch UA with reflex to culture. ?Give D5 1/2 NS 1 liter over 3 hours. ? ? Mycobacterium avium infection (MAC) ?Chronic.  Took 3 doses of Levofloxacin and refused further. ?CBC ?Continue O2 @ 2L [place] prn to keep sats >90%. ? ? Palliative Care Encounter ?Discussed goals of care with pt and daughter. ? ?  Hyponatremia ?Hx of mid 120s off NACL tabs.  ?Reporting lightheadedness-CMP stat.   ?Recommend continuing NaCl 1 gm daily. ? ? ?Follow up Palliative Care Visit: Palliative care will continue to follow for complex medical decision making, advance care planning, and clarification of goals. Return 2 weeks or prn. ? ? ? ?This visit was coded based on medical decision making (MDM). ? ?PPS: 50% ? ?HOSPICE ELIGIBILITY/DIAGNOSIS: TBD ? ?Chief Complaint:  ? [facility] received a referral to follow up with patient for chronic disease management in hx of MAC infection with bronchiectasis and recent episode of hemoptysis.  Palliative Care is also following for advance directive and defining/refining goals of care.   ? ?HISTORY OF PRESENT ILLNESS:  Christine Vaughan is a 88 y.o. year old female with bronchiectasis and MAC infection with recent episode of hemoptysis, esophageal stricture, HTN, hx of chronic DVT RLE and PE (no PE recently), hypothyroidism, hx of right breast cancer s/p lumpectomy/chemo/radiation, neuropathy, trigeminal neuralgia, panic and anxiety.  Pt states  "I feel like since Sunday my head has been feeling funny like it doesn't belong to me.".  She is having trouble with swallowing and feelings of early satiety with foods and fluids.  She drinks a glass of water daily with her Synthroid.  Recently she choked trying to swallow a Colace tab and had to have the heimlich maneuver to clear it.  She is scheduled for EGD and esophageal dilatation on 06/28/21.  She drinks some after each bite of food but it takes her  sometimes 2 hours to eat her meal. Facility staff and daughter indicate patient is declining in function.  Daughter states initially weight loss was large but pt has been eating better and weight has been stable for the past couple of months. Denies nausea, vomiting but has had issues with constipation.  She is on sodium chloride tablets and has long history of hyponatremia  dating back to 2020 at least with sodiums in mid 120s.  Most recent NA was 134 on 05/11/21.  She was seen in the ED on 05/11/21 for episodes of hemoptysis, had CTA pulmonary which showed no PE but bronchiectasis.She was started on Levofloxacin and took 3 doses but then realized she was on a -floxacin and states she has had prior problems with her neuropathy so she stopped taking it. Denies fever, worsening cough, continued hemoptysis.  She had a new concentrator brought in from [employer] and states she can only run it for 5 minutes before it blows the circuit and her lights go out.  ?Has to have help bathing with set up, dressing herself.  No falls recently, walks with a rollator.  States spouse passed away Valentine's Day last year and one of her daughters passed away with the memorial service in March. Has been in the hospital 6 times in the last 8 months per daughter. Has good family support with multiple family members checking on her daily. ? ?History obtained from review of EMR, discussion with family, facility staff and/or Ms. Christine Vaughan.  ?I reviewed available labs, medications, imaging, studies and related documents from the EMR.  Records reviewed and summarized above.  ? ?ROS ?General: NAD ?EYES: denies vision changes ?ENMT: endorses dysphagia with foods and fluids ?Cardiovascular: denies chest pain, denies DOE ?Pulmonary: denies increased SOB ?Abdomen: endorses fair appetite and constipation, endorses continence of bowel ?GU: denies dysuria, endorses continence of urine ?MSK:  denies increased weakness, no falls reported ?Skin: denies rashes or wounds ?Neurological: denies pain, denies insomnia ?Psych: Endorses positive mood, particularly on Sertraline even with decrease in dose ?Heme/lymph/immuno: denies bruises, abnormal bleeding ? ?Physical Exam: ?Current and past weights:107 lbs 8 ounces as of 05/03/21 ?Constitutional: NAD ?General: frail appearing, thin ?EYES: anicteric sclera, lids intact, no discharge   ?ENMT: mildly HOH, oral mucous membranes moist, dentition intact ?CV: S1S2, RRR with LUSB holosystolic murmur, no LE edema ?Pulmonary: CTAB except fine crackles in LLL, no increased work of breathing, no cough, room air ?Abdomen:  normo-active BS + 4 quadrants, soft and non tender, no ascites ?GU: deferred ?MSK: noted sarcopenia of all 4 extremities, moves all extremities, ambulatory with rollator ?Skin: warm and dry, no rashes or wounds on visible skin ?Neuro:  no generalized weakness, some short term memory deficits ?Psych: non-anxious affect, A and O x 3 ?Hem/lymph/immuno: no widespread bruising ? ?CURRENT PROBLEM LIST:  ?Patient Active Problem List  ? Diagnosis Date Noted  ? Orthostatic hypotension 06/10/2019  ? TIA (transient ischemic attack) 06/09/2019  ? Hyponatremia 06/09/2019  ? Hypokalemia 06/09/2019  ? Chronic deep vein thrombosis (DVT) of right lower extremity ([place]) 03/25/2018  ? Pulmonary embolus ([place]) 03/25/2018  ? Use of cane as ambulatory aid 01/15/2018  ? MAI (mycobacterium avium-intracellulare) infection ([place]) 06/16/2017  ? Headache syndrome 12/27/2015  ? Memory change 12/27/2015  ? Vitamin B12 deficiency 12/27/2015  ? Hereditary and idiopathic peripheral neuropathy 12/27/2015  ? Abnormality of gait 12/27/2015  ? Hypertension   ? Health care maintenance 10/12/2014  ? Mixed hyperlipidemia 10/07/2013  ? Trigeminal neuralgia 09/19/2013  ?  Panic anxiety syndrome 09/19/2013  ? Osteopenia 09/19/2013  ? Hypothyroid 09/19/2013  ? 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 Depression, major, in remission ([place]) 09/19/2013  ? Bronchiectasis ([place]) 09/19/2013  ? History of radiation therapy 07/15/2011  ? Breast cancer ([place]) 07/15/2011  ? 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 Optic neuropathy 12/31/2010  ? Nuclear cataract 12/31/2010  ? Glaucoma suspect 12/31/2010  ? ?PAST MEDICAL HISTORY:  ?Active Ambulatory Problems  ?  Diagnosis Date Noted  ? Hypertension   ? Trigeminal  neuralgia 09/19/2013  ? 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 Panic anxiety syndrome 09/19/2013  ? Osteopenia 09/19/2013  ? Optic neuropathy 12/31/2010  ? Nuclear cataract 12/31/2010  ? Hypothyroi

## 2021-06-01 ENCOUNTER — Encounter: Payer: Self-pay | Admitting: *Deleted

## 2021-06-01 ENCOUNTER — Encounter: Payer: Self-pay | Admitting: Family Medicine

## 2021-06-01 DIAGNOSIS — E860 Dehydration: Secondary | ICD-10-CM | POA: Insufficient documentation

## 2021-06-01 DIAGNOSIS — R1310 Dysphagia, unspecified: Secondary | ICD-10-CM | POA: Insufficient documentation

## 2021-06-01 DIAGNOSIS — Z515 Encounter for palliative care: Secondary | ICD-10-CM | POA: Insufficient documentation

## 2021-06-01 NOTE — Progress Notes (Signed)
Home health orders for ST/RN requested to be faxed to [facility] by Damaris Hippo NP. Patient resides at [facility]. I spoke with Navigator at [facility] who states that they will not be able to accommodate order for [facility] RN for labs and dehydration, as they do not do IV fluids just for hydration purposes and also would not have the staff available to do so and labs are not considered a skilled need. I did fax orders over for [facility] ST for dysphagia. Santiago Glad NP made aware. ?

## 2021-06-04 ENCOUNTER — Other Ambulatory Visit: Payer: Self-pay

## 2021-06-04 ENCOUNTER — Emergency Department (HOSPITAL_BASED_OUTPATIENT_CLINIC_OR_DEPARTMENT_OTHER): Payer: Medicare PPO

## 2021-06-04 ENCOUNTER — Encounter (HOSPITAL_BASED_OUTPATIENT_CLINIC_OR_DEPARTMENT_OTHER): Payer: Self-pay | Admitting: Emergency Medicine

## 2021-06-04 ENCOUNTER — Emergency Department (HOSPITAL_BASED_OUTPATIENT_CLINIC_OR_DEPARTMENT_OTHER)
Admission: EM | Admit: 2021-06-04 | Discharge: 2021-06-04 | Disposition: A | Discharge status: Home / Self Care | Payer: Medicare PPO | Attending: Emergency Medicine | Admitting: Emergency Medicine

## 2021-06-04 DIAGNOSIS — Z7982 Long term (current) use of aspirin: Secondary | ICD-10-CM | POA: Insufficient documentation

## 2021-06-04 DIAGNOSIS — R531 Weakness: Secondary | ICD-10-CM | POA: Diagnosis present

## 2021-06-04 DIAGNOSIS — K5792 Diverticulitis of intestine, part unspecified, without perforation or abscess without bleeding: Secondary | ICD-10-CM | POA: Diagnosis not present

## 2021-06-04 LAB — BASIC METABOLIC PANEL
Anion gap: 11 (ref 5–15)
BUN: 12 mg/dL (ref 8–23)
CO2: 25 mmol/L (ref 22–32)
Calcium: 9.3 mg/dL (ref 8.9–10.3)
Chloride: 95 mmol/L — ABNORMAL LOW (ref 98–111)
Creatinine, Ser: 0.7 mg/dL (ref 0.44–1.00)
GFR, Estimated: >60 mL/min (ref >60)
Glucose, Bld: 91 mg/dL (ref 70–99)
Potassium: 3.8 mmol/L (ref 3.5–5.1)
Sodium: 131 mmol/L — ABNORMAL LOW (ref 135–145)

## 2021-06-04 LAB — CBC
HCT: 36.1 % (ref 36.0–46.0)
Hemoglobin: 11.5 g/dL — ABNORMAL LOW (ref 12.0–15.0)
MCH: 30.7 pg (ref 26.0–34.0)
MCHC: 31.9 g/dL (ref 30.0–36.0)
MCV: 96.5 fL (ref 80.0–100.0)
Platelets: 167 10*3/uL (ref 150–400)
RBC: 3.74 MIL/uL — ABNORMAL LOW (ref 3.87–5.11)
RDW: 14.2 % (ref 11.5–15.5)
WBC: 6.6 10*3/uL (ref 4.0–10.5)
nRBC: 0 % (ref 0.0–0.2)

## 2021-06-04 LAB — TROPONIN I (HIGH SENSITIVITY): Troponin I (High Sensitivity): 5 ng/L (ref <18)

## 2021-06-04 LAB — URINALYSIS, ROUTINE W REFLEX MICROSCOPIC
Bilirubin Urine: NEGATIVE
Glucose, UA: NEGATIVE mg/dL
Hgb urine dipstick: NEGATIVE
Ketones, ur: NEGATIVE mg/dL
Nitrite: NEGATIVE
Protein, ur: NEGATIVE mg/dL
Specific Gravity, Urine: 1.01 (ref 1.005–1.030)
pH: 6 (ref 5.0–8.0)

## 2021-06-04 MED ORDER — IOHEXOL 300 MG/ML  SOLN
75.0000 mL | Freq: Once | INTRAMUSCULAR | Status: AC | PRN
  Administered 2021-06-04: 75 mL via INTRAVENOUS

## 2021-06-04 MED ORDER — CEFTRIAXONE SODIUM 2 G IJ SOLR
2.0000 g | Freq: Once | INTRAMUSCULAR | Status: AC
  Administered 2021-06-04: 2 g via INTRAVENOUS
  Filled 2021-06-04: qty 20

## 2021-06-04 MED ORDER — CLINDAMYCIN HCL 300 MG PO CAPS: 300.0000 mg | ORAL_CAPSULE | Freq: Three times a day (TID) | ORAL | 0 refills | Status: AC

## 2021-06-04 MED ORDER — CEFDINIR 300 MG PO CAPS: 300.0000 mg | ORAL_CAPSULE | Freq: Two times a day (BID) | ORAL | 0 refills | Status: AC

## 2021-06-04 NOTE — Discharge Instructions (Signed)
Advise liquid diet for the next 3 days.  Advance gradually as your pain improves. ? ?Call your GI physician in 2 days to schedule an appointment. ?Return immediately back to the ER if: ? ?Your symptoms worsen within the next 12-24 hours. ?You develop new symptoms such as new fevers, persistent vomiting, new pain, shortness of breath, or new weakness or numbness, or if you have any other concerns. ? ?

## 2021-06-04 NOTE — ED Provider Notes (Signed)
?[facility] EMERG[facility] DEPT ?Provider Note ? ? ?CSN: [id number] ?Arrival date & time: 06/04/21  1300 ? ?  ? ?History ? ?Chief Complaint  ?Patient presents with  ? Weakness  ? ? ?Christine Vaughan is a 88 y.o. female. ? ?Patient presents with complaint of generalized fatigue abdominal pain difficulty urinating.  Symptoms been ongoing for the past 4 to 6 days.  Denies any fevers denies any vomiting denies diarrhea. ? ? ?  ? ?Home Medications ?Prior to Admission medications   ?Medication Sig Start Date End Date Taking? Authorizing Provider  ?cefdinir (OMNICEF) 300 MG capsule Take 1 capsule (300 mg total) by mouth 2 (two) times daily for 7 days. 06/04/21 06/11/21 Yes Luna Fuse, MD  ?clindamycin (CLEOCIN) 300 MG capsule Take 1 capsule (300 mg total) by mouth 3 (three) times daily for 7 days. 06/04/21 06/11/21 Yes Luna Fuse, MD  ?acetaminophen (TYLENOL) 500 MG tablet Take 500 mg by mouth every 6 (six) hours as needed for moderate pain or headache.    [provider]  ?albuterol (VENTOLIN HFA) 108 (90 Base) MCG/ACT inhaler Inhale 2 puffs into the lungs every 6 (six) hours as needed for wheezing or shortness of breath.    [provider]  ?ALPRAZolam Duanne Moron) 0.25 MG tablet Take 0.125 mg by mouth 2 (two) times daily as needed for anxiety.    [provider]  ?aspirin EC 81 MG tablet Take 81 mg by mouth at bedtime. Swallow whole.    [provider]  ?docusate sodium (COLACE) 100 MG capsule Take 100 mg by mouth daily.    [provider]  ?esomeprazole (NEXIUM) 40 MG capsule Take 40 mg by mouth daily at 4 PM. 12/24/16   [provider]  ?EZALLOR SPRINKLE 10 MG CPSP Take 1 capsule by mouth at bedtime. Open and sprinkle in applesauce 12/11/20   [provider]  ?gabapentin (NEURONTIN) 100 MG capsule Take 100-200 mg by mouth See admin instructions. 100 mg in the morning ?200 mg at bedtime    [provider]  ?ipratropium (ATROVENT) 0.06 %  nasal spray Place 2 sprays into the nose 2 (two) times daily as needed for rhinitis. 03/19/18 12/23/20  [provider]  ?levofloxacin (LEVAQUIN) 500 MG tablet Take 1 tablet (500 mg total) by mouth daily. Start taking tomorrow 05/12/21 05/11/21   Blanchie Dessert, MD  ?levothyroxine (SYNTHROID, LEVOTHROID) 50 MCG tablet Take 50 mcg by mouth daily before breakfast.    [provider]  ?OXYGEN Inhale 2 L into the lungs daily as needed (shortness of breath).    [provider]  ?Psyllium (METAMUCIL) 0.36 g CAPS Take 1 capsule by mouth at bedtime. ?Patient not taking: Reported on 05/03/2021    [provider]  ?sennosides-docusate sodium (SENOKOT-S) 8.6-50 MG tablet Take 1 tablet by mouth daily as needed for constipation.    [provider]  ?sertraline (ZOLOFT) 25 MG tablet Take 1 tablet (25 mg total) by mouth daily. 11/09/18   Rainey Pines, MD  ?simethicone (MYLICON) 80 MG chewable tablet Chew 80 mg by mouth at bedtime as needed for flatulence.    [provider]  ?sodium chloride 1 g tablet Take 1 g by mouth at bedtime.    [provider]  ?tobramycin, PF, (TOBI) 300 MG/5ML nebulizer solution Take 300 mg by nebulization daily as needed (shortness of breath).    [provider]  ?zinc oxide 20 % ointment Apply 1 application topically 4 (four) times daily as needed  for irritation.    [provider]  ?   ? ?Allergies    ?Ciprofloxacin, Elemental sulfur, Erythromycin, Metronidazole, Tizanidine, Penicillins, and Sulfamethoxazole   ? ?Review of Systems   ?Review of Systems  ?Constitutional:  Negative for fever.  ?HENT:  Negative for ear pain.   ?Eyes:  Negative for pain.  ?Respiratory:  Negative for cough.   ?Cardiovascular:  Negative for chest pain.  ?Gastrointestinal:  Positive for abdominal pain.  ?Genitourinary:  Negative for flank pain.  ?Musculoskeletal:  Negative for back pain.  ?Skin:  Negative for rash.  ?Neurological:  Negative for  headaches.  ? ?Physical Exam ?Updated Vital Signs ?BP (!) 148/81   Pulse 79   Temp 97.9 ?F (36.6 ?C) (Oral)   Resp (!) 24   Ht '5\' 4"'$  (1.626 m)   Wt 45.4 kg   SpO2 93%   BMI 17.16 kg/m?  ?Physical Exam ?Constitutional:   ?   General: She is not in acute distress. ?   Appearance: Normal appearance.  ?HENT:  ?   Head: Normocephalic.  ?   Nose: Nose normal.  ?Eyes:  ?   Extraocular Movements: Extraocular movements intact.  ?Cardiovascular:  ?   Rate and Rhythm: Normal rate.  ?Pulmonary:  ?   Effort: Pulmonary effort is normal.  ?Abdominal:  ?   Tenderness: There is abdominal tenderness.  ?   Comments: Lower diffuse abdominal tenderness on exam.  No guarding or rebound.  ?Musculoskeletal:     ?   General: Normal range of motion.  ?   Cervical back: Normal range of motion.  ?Neurological:  ?   General: No focal deficit present.  ?   Mental Status: She is alert. Mental status is at baseline.  ? ? ?ED Results / Procedures / Treatments   ?Labs ?(all labs ordered are listed, but only abnormal results are displayed) ?Labs Reviewed  ?BASIC METABOLIC PANEL - Abnormal; Notable for the following components:  ?    Result Value  ? Sodium 131 (*)   ? Chloride 95 (*)   ? All other components within normal limits  ?CBC - Abnormal; Notable for the following components:  ? RBC 3.74 (*)   ? Hemoglobin 11.5 (*)   ? All other components within normal limits  ?URINALYSIS, ROUTINE W REFLEX MICROSCOPIC - Abnormal; Notable for the following components:  ? Leukocytes,Ua SMALL (*)   ? All other components within normal limits  ?TROPONIN I (HIGH SENSITIVITY)  ?TROPONIN I (HIGH SENSITIVITY)  ? ? ?EKG ?EKG Interpretation ? ?Date/Time:  Monday June 04 2021 15:25:38 EDT ?Ventricular Rate:  77 ?PR Interval:  151 ?QRS Duration: 119 ?QT Interval:  420 ?QTC Calculation: 476 ?R Axis:   -87 ?Text Interpretation: Sinus rhythm Left anterior fascicular block Probable left ventricular hypertrophy Confirmed by Thamas Jaegers ([id number]) on 06/04/2021 3:45:51  PM ? ?Radiology ?CT ABDOMEN PELVIS W CONTRAST ? ?Result Date: 06/04/2021 ?CLINICAL DATA:  Abdominal pain EXAM: CT ABDOMEN AND PELVIS WITH CONTRAST TECHNIQUE: Multidetector CT imaging of the abdomen and pelvis was performed using the standard protocol following bolus administration of intravenous contrast. RADIATION DOSE REDUCTION: This exam was performed according to the departmental dose-optimization program which includes automated exposure control, adjustment of the mA and/or kV according to patient size and/or use of iterative reconstruction technique. CONTRAST:  66m OMNIPAQUE IOHEXOL 300 MG/ML  SOLN COMPARISON:  CT abdomen and pelvis 12/23/2020 FINDINGS: Lower chest: No acute abnormality identified. Chronic changes in the lungs including bronchiectasis, areas of mucous plugging,  atelectatic changes and small right and trace left pleural effusions. Hepatobiliary: Liver is normal in size with no suspicious mass identified. Gallbladder is surgically absent. No significant biliary ductal dilatation appreciated. Pancreas: No suspicious mass or ductal dilatation identified. Spleen: Numerous calcified granulomas.  Normal size. Adrenals/Urinary Tract: Stable thickening of the left adrenal gland. Symmetric perfusion of the kidneys. Mild renal cortical thinning and lobulation bilaterally. A few subcentimeter hypodensities in both kidneys which likely represent cysts, measuring up to 8 mm on the left. No hydronephrosis identified. Urinary bladder appears normal. Stomach/Bowel: No bowel obstruction, free air or pneumatosis. Extensive colonic diverticulosis. Segment of colonic wall thickening and edema with adjacent fat stranding identified in the sigmoid colon. No defined abscess appreciated. Vascular/Lymphatic: Aortic atherosclerosis. No enlarged abdominal or pelvic lymph nodes. Reproductive: Status post hysterectomy. No adnexal masses. Other: Small volume ascites. Musculoskeletal: Degenerative changes in the lumbar  spine. No suspicious bony lesions identified. IMPRESSION: 1. Evidence of acute diverticulitis in the sigmoid colon. Recommend follow-up after treatment to ensure resolution. 2. Small volume ascites. 3. Small pleural effusi

## 2021-06-04 NOTE — ED Notes (Signed)
RT Note: Pt. and Daughter placed in small waiting room after pt. was unable to provide U/A sample, Daughter is having her Mother drink some water to attempt again shortly. ?

## 2021-06-04 NOTE — ED Notes (Signed)
Pt attempted urine sample without success.  ?

## 2021-06-04 NOTE — ED Notes (Signed)
Second Troponin Collection not needed for Disposition per Almyra Free, MD. ?

## 2021-06-04 NOTE — ED Triage Notes (Addendum)
Pt scheduled to have esophagus stretched next month. Per daughter, pt c/o fatigue, generalized weakness x 1 week. States she feels "out of her head" and falls asleep often. She receives palliative care. There is concern for dehydration. Also c/o dysuria.  ?

## 2021-06-04 NOTE — ED Notes (Signed)
MD at the Bedside. 

## 2021-06-04 NOTE — ED Notes (Signed)
ED Provider at bedside. 

## 2021-06-12 ENCOUNTER — Non-Acute Institutional Stay: Payer: Medicare PPO | Admitting: Hospice

## 2021-06-12 DIAGNOSIS — Z515 Encounter for palliative care: Secondary | ICD-10-CM

## 2021-06-12 DIAGNOSIS — J479 Bronchiectasis, uncomplicated: Secondary | ICD-10-CM

## 2021-06-12 DIAGNOSIS — K5792 Diverticulitis of intestine, part unspecified, without perforation or abscess without bleeding: Secondary | ICD-10-CM

## 2021-06-12 DIAGNOSIS — R1310 Dysphagia, unspecified: Secondary | ICD-10-CM

## 2021-06-12 DIAGNOSIS — R634 Abnormal weight loss: Secondary | ICD-10-CM

## 2021-06-12 NOTE — Progress Notes (Signed)
? ? ?[facility] ?Community Palliative Care Consult Note ?Telephone: [phone]  ?Fax: [phone] ? ?PATIENT NAME: Christine Vaughan ?DOB: 11-03-1931 ?MRN: [id number] ? ?PRIMARY CARE PROVIDER:   Reymundo Poll, MD ?Reymundo Poll, MD ?[address] ?[place],  [place] [address] ? ?REFERRING PROVIDER: Alver Fisher, NP ? ?RESPONSIBLE PARTY:  Manuela Schwartz ?Contact Information   ? ? Name Relation Home Work Mobile  ? Rowell,Susan Daughter [phone]  [phone]  ? Conerford,Linda Daughter [phone]  [phone]  ? Krohn,Cindy Daughter [phone]    ? ?  ? ? ?Visit is to build trust and highlight Palliative Medicine as specialized medical care for people living with serious illness, aimed at facilitating better quality of life through symptoms relief, assisting with advance care planning and complex medical decision making. Wendelyn Breslow - grand daughter is with patient during visit. Manuela Schwartz joined visit via telephone, discussion centering on what Palliative care is about and advance care planning.  All questions answered, clarifications provided.  Manuela Schwartz endorsed palliative service.  This is a follow up visit.  ?ASSESSMENT AND / RECOMMENDATIONS:  ?  ?Advance Care Planning: Our advance care planning conversation included a discussion about:    ?The value and importance of advance care planning  ?Difference between Hospice and Palliative care ?Exploration of goals of care in the event of a sudden injury or illness  ?Identification and preparation of a healthcare agent  ?Review and updating or creation of an  advance directive document . ?Decision not to resuscitate or to de-escalate disease focused treatments due to poor prognosis. ? ?Code Status: Extensive discussion on ramifications and implications of CODE STATUS.  Patient and Vinnie Level agreed on Partial CODE STATUS.  Chest compressions with ACLS medications; no intubation/mechanical ventilation.  ?Family is ready to further discuss as patient continues to  decline in functional status.  Hospice is desired when appropriate/family ready. ? ?Goals of Care: Goals of care include to maximize quality of life and symptom management. ? ?Visit consisted of counseling and education dealing with the complex and emotionally intense issues of symptom management and palliative care in the setting of serious and potentially life-threatening illness. ? ?Collaborative discussions with facility NP - Remo Lipps on patient's status and goals of care. Palliative care team will continue to support patient, patient's family, and medical team. ? ?I spent 46 minutes providing this consultation. More than 50% of the time in this consultation was spent on counseling patient and coordinating communication. ?-------------------------------------------------------------------------------------------------------------------------------------- ? ?Symptom management/Plan:  ?Dysphagia: Worsening.  Scheduled for EGD with esophageal dilatation 06/28/21.  Aspiration precautions. Upright for all intake and for 30-60 minutes after. Continue pureed diet.  ?Diverticulitis: Stable.  Completed antibiotics  yesterday as ordered. No abdominal pain/complains.  ?Depression: Managed with Zoloft.  Encourage socialization and participation in facility activities. ?Bronchiectasis: Managed with oxygen supplementation and breathing treatments.  ?Abnormal weight loss: related to worsening dysphagia. Height/Weight: 5 feet 4 inches/95 Ibs down from about 150 Ibs a year ago. Provide assistance during meals to ensure adequate oral intake.  Small bites of soft pur?ed diet with sips of Ensure/fluids in between. ?Collaborative discussions with Remo Lipps NP to initiate Mirtazapine 7.5 mg at bedtime to help boost appetite, Ensure BID. Monitor weight per facility protocol.  Routine CBC CMP. ? ?Follow up: Palliative care will continue to follow for complex medical decision making, advance care planning, and clarification of goals. Return 6  weeks or prn. Encouraged to call provider sooner with any concerns. ? ?CHIEF COMPLAINT: Palliative follow up ? ?HISTORY OF PRESENT ILLNESS:  PASTY Vaughan a 88 y.o. female with multiple medical problems including dysphagia, bronchiectasis, abnormal weight loss. history of depression, anxiety, diverticulitis.  Patient endorses worsening dysphagia and weakness, denies pain/discomfort.  History obtained from review of EMR, discussion with primary team, family and/or patient. Records reviewed and summarized above. All 10 point systems reviewed and are negative except as documented in history of present illness above ? ?Review and summarization of Epic records shows history from other than patient.  ? ?Palliative Care was asked to follow this patient o help address complex decision making in the context of advance care planning and goals of care clarification. Independent interpretation of tests and reviewed as needed, available labs, patient records, imaging, studies and related documents from the EMR. ? ?PHYSICAL EXAM  ?General: Frail looking, in no acute distress, appropriately dressed ?Cardiovascular: regular rate and rhythm ?Pulmonary: no cough, no increased work of breathing, normal respiratory effort ?Abdomen: soft, non tender, no guarding, positive bowel sounds in all quadrants ?GU:  no suprapubic tenderness ?Eyes: Normal lids, no discharge ?ENMT: Moist mucous membranes ?Musculoskeletal:  weakness, severe sarcopenia ?Skin: no rash to visible skin, warm without cyanosis,  ?Psych: non-anxious affect ?Neurological: Weakness but otherwise non focal ?Heme/lymph/immuno: no bruises, no bleeding ? ?PERTINENT MEDICATIONS:  ?Outpatient Encounter Medications as of 06/12/2021  ?Medication Sig  ? acetaminophen (TYLENOL) 500 MG tablet Take 500 mg by mouth every 6 (six) hours as needed for moderate pain or headache.  ? albuterol (VENTOLIN HFA) 108 (90 Base) MCG/ACT inhaler Inhale 2 puffs into the lungs every 6 (six) hours  as needed for wheezing or shortness of breath.  ? ALPRAZolam (XANAX) 0.25 MG tablet Take 0.125 mg by mouth 2 (two) times daily as needed for anxiety.  ? aspirin EC 81 MG tablet Take 81 mg by mouth at bedtime. Swallow whole.  ? docusate sodium (COLACE) 100 MG capsule Take 100 mg by mouth daily.  ? esomeprazole (NEXIUM) 40 MG capsule Take 40 mg by mouth daily at 4 PM.  ? EZALLOR SPRINKLE 10 MG CPSP Take 1 capsule by mouth at bedtime. Open and sprinkle in applesauce  ? gabapentin (NEURONTIN) 100 MG capsule Take 100-200 mg by mouth See admin instructions. 100 mg in the morning ?200 mg at bedtime  ? ipratropium (ATROVENT) 0.06 % nasal spray Place 2 sprays into the nose 2 (two) times daily as needed for rhinitis.  ? levofloxacin (LEVAQUIN) 500 MG tablet Take 1 tablet (500 mg total) by mouth daily. Start taking tomorrow 05/12/21  ? levothyroxine (SYNTHROID, LEVOTHROID) 50 MCG tablet Take 50 mcg by mouth daily before breakfast.  ? OXYGEN Inhale 2 L into the lungs daily as needed (shortness of breath).  ? Psyllium (METAMUCIL) 0.36 g CAPS Take 1 capsule by mouth at bedtime. (Patient not taking: Reported on 05/03/2021)  ? sennosides-docusate sodium (SENOKOT-S) 8.6-50 MG tablet Take 1 tablet by mouth daily as needed for constipation.  ? sertraline (ZOLOFT) 25 MG tablet Take 1 tablet (25 mg total) by mouth daily.  ? simethicone (MYLICON) 80 MG chewable tablet Chew 80 mg by mouth at bedtime as needed for flatulence.  ? sodium chloride 1 g tablet Take 1 g by mouth at bedtime.  ? tobramycin, PF, (TOBI) 300 MG/5ML nebulizer solution Take 300 mg by nebulization daily as needed (shortness of breath).  ? zinc oxide 20 % ointment Apply 1 application topically 4 (four) times daily as needed for irritation.  ? ?No facility-administered encounter medications on file as of 06/12/2021.  ? ? ?HOSPICE  ELIGIBILITY/DIAGNOSIS: TBD.  Manuela Schwartz understands patient's worsening decline in functional status and is ready for hospice service when patient can  embrace it. ? ?PAST MEDICAL HISTORY:  ?Past Medical History:  ?Diagnosis Date  ? Anxiety   ? Breast cancer, right ([place]) 2001  ? Right Lumpectomy, chemo + rad tx's.   ? Chronic kidney disease   ? Hereditary a

## 2021-06-14 ENCOUNTER — Ambulatory Visit: Payer: Self-pay | Admitting: Dietician

## 2021-06-20 ENCOUNTER — Encounter: Payer: Self-pay | Admitting: Gastroenterology

## 2021-06-20 ENCOUNTER — Ambulatory Visit (INDEPENDENT_AMBULATORY_CARE_PROVIDER_SITE_OTHER): Payer: Medicare PPO | Admitting: Gastroenterology

## 2021-06-20 VITALS — BP 122/70 | HR 74 | Ht 66.0 in | Wt 101.1 lb

## 2021-06-20 DIAGNOSIS — K5900 Constipation, unspecified: Secondary | ICD-10-CM

## 2021-06-20 DIAGNOSIS — R1319 Other dysphagia: Secondary | ICD-10-CM | POA: Diagnosis not present

## 2021-06-20 DIAGNOSIS — K5732 Diverticulitis of large intestine without perforation or abscess without bleeding: Secondary | ICD-10-CM | POA: Diagnosis not present

## 2021-06-20 NOTE — H&P (View-Only) (Signed)
? ?HPI : Christine Vaughan is a very pleasant 88 year old female with a history of anxiety, breast cancer and chronic bronchitis/MAC pneumonia who I initially saw March 9 of this year for persistent dysphagia with weight loss.  She was noted to have a distal esophageal stenosis which obstructed passage of the barium tablet.  Although she is very frail, she and her family are very bothered by her swallowing difficulties and associated weight loss, and wanted to pursue endoscopic therapy.  She is scheduled for an EGD with dilation next week in the hospital. ?Today, she reports that her dysphagia is about the same as it was when she saw me almost 2 months ago.  She has not had further weight loss, however. ?On April 10 the patient presented to the emergency department with several days of lower abdominal pain.  The pain had gotten progressively worse over a few days prompting her emergency room department visit.  A CT scan was consistent with uncomplicated sigmoid diverticulitis.  She was treated with antibiotics and recommended to follow a liquid diet for a few days.  She did not have any fevers or chills and did not have a leukocytosis.  No nausea or vomiting.  No blood in her stool. ?This is her first documented episode of diverticulitis. ?The patient continues to have problems with constipation, often going only once a week.  She is given Metamucil and her care home [employer]) but states that she cannot drink it fast enough and it becomes "gummed up" and she cannot swallow it.  She does take senna regularly.  She thinks she has taken MiraLAX in the past, but does not currently. ? ? ? ?Past Medical History:  ?Diagnosis Date  ? Anxiety   ? Breast cancer, right ([place]) 2001  ? Right Lumpectomy, chemo + rad tx's.   ? Chronic kidney disease   ? Hereditary and idiopathic peripheral neuropathy 12/27/2015  ? Hypertension   ? MAI (mycobacterium avium-intracellulare) ([place])   ? Thyroid disease   ? ? ? ?Past Surgical  History:  ?Procedure Laterality Date  ? BACK SURGERY    ? BREAST BIOPSY Left   ? surgical bx pt dose not remember  ? BREAST EXCISIONAL BIOPSY Right 2001  ? + chem rad and armidex  ? BREAST LUMPECTOMY  2001  ? BREAST SURGERY    ? CHOLECYSTECTOMY    ? ESOPHAGOGASTRODUODENOSCOPY (EGD) WITH PROPOFOL N/A 09/20/2015  ? Procedure: ESOPHAGOGASTRODUODENOSCOPY (EGD) WITH PROPOFOL;  Surgeon: Lollie Sails, MD;  Location: [facility] ENDOSCOPY;  Service: Endoscopy;  Laterality: N/A;  ? ?Family History  ?Problem Relation Age of Onset  ? Depression Mother   ? Brain cancer Mother   ? Breast cancer Paternal Aunt   ? ?Social History  ? ?Tobacco Use  ? Smoking status: Never  ? Smokeless tobacco: Never  ?Vaping Use  ? Vaping Use: Never used  ?Substance Use Topics  ? Alcohol use: No  ? Drug use: No  ? ?Current Outpatient Medications  ?Medication Sig Dispense Refill  ? acetaminophen (TYLENOL) 500 MG tablet Take 500 mg by mouth every 6 (six) hours as needed for moderate pain or headache.    ? albuterol (VENTOLIN HFA) 108 (90 Base) MCG/ACT inhaler Inhale 2 puffs into the lungs every 6 (six) hours as needed for wheezing or shortness of breath.    ? ALPRAZolam (XANAX) 0.25 MG tablet Take 0.125 mg by mouth 2 (two) times daily as needed for anxiety.    ? aspirin EC 81 MG tablet  Take 81 mg by mouth at bedtime. Swallow whole.    ? docusate sodium (COLACE) 100 MG capsule Take 100 mg by mouth daily.    ? esomeprazole (NEXIUM) 40 MG capsule Take 40 mg by mouth daily at 4 PM.    ? EZALLOR SPRINKLE 10 MG CPSP Take 1 capsule by mouth at bedtime. Open and sprinkle in applesauce    ? gabapentin (NEURONTIN) 100 MG capsule Take 100-200 mg by mouth See admin instructions. 100 mg in the morning ?200 mg at bedtime    ? levothyroxine (SYNTHROID, LEVOTHROID) 50 MCG tablet Take 50 mcg by mouth daily before breakfast.    ? OXYGEN Inhale 2 L into the lungs daily as needed (shortness of breath).    ? Psyllium (METAMUCIL) 0.36 g CAPS Take 1 capsule by mouth at  bedtime.    ? sennosides-docusate sodium (SENOKOT-S) 8.6-50 MG tablet Take 1 tablet by mouth daily as needed for constipation.    ? sertraline (ZOLOFT) 25 MG tablet Take 1 tablet (25 mg total) by mouth daily. 30 tablet 3  ? simethicone (MYLICON) 80 MG chewable tablet Chew 80 mg by mouth at bedtime as needed for flatulence.    ? sodium chloride 1 g tablet Take 1 g by mouth at bedtime.    ? tobramycin, PF, (TOBI) 300 MG/5ML nebulizer solution Take 300 mg by nebulization daily as needed (shortness of breath).    ? zinc oxide 20 % ointment Apply 1 application topically 4 (four) times daily as needed for irritation.    ? ipratropium (ATROVENT) 0.06 % nasal spray Place 2 sprays into the nose 2 (two) times daily as needed for rhinitis.    ? ?No current facility-administered medications for this visit.  ? ?Allergies  ?Allergen Reactions  ? Ciprofloxacin Other (See Comments)  ?  aggravates her neuropathy  ? Elemental Sulfur Other (See Comments)  ?  Doesn't remember  ? Erythromycin Other (See Comments)  ?  Gi distress  ? Metronidazole   ?  Other reaction(s): Abdominal Pain  ? Tizanidine Other (See Comments)  ?  GI upset  ? Penicillins Rash  ?  Has patient had a PCN reaction causing immediate rash, facial/tongue/throat swelling, SOB or lightheadedness with hypotension: No ?Has patient had a PCN reaction causing severe rash involving mucus membranes or skin necrosis: No ?Has patient had a PCN reaction that required hospitalization No ?Has patient had a PCN reaction occurring within the last 10 years: no ?If all of the above answers are "NO", then may proceed with Cephalosporin use.  ? Sulfamethoxazole Rash  ?  all over body  ? ? ? ?Review of Systems: ?All systems reviewed and negative except where noted in HPI.  ? ? ?CT ABDOMEN PELVIS W CONTRAST ? ?Result Date: 06/04/2021 ?CLINICAL DATA:  Abdominal pain EXAM: CT ABDOMEN AND PELVIS WITH CONTRAST TECHNIQUE: Multidetector CT imaging of the abdomen and pelvis was performed using the  standard protocol following bolus administration of intravenous contrast. RADIATION DOSE REDUCTION: This exam was performed according to the departmental dose-optimization program which includes automated exposure control, adjustment of the mA and/or kV according to patient size and/or use of iterative reconstruction technique. CONTRAST:  38m OMNIPAQUE IOHEXOL 300 MG/ML  SOLN COMPARISON:  CT abdomen and pelvis 12/23/2020 FINDINGS: Lower chest: No acute abnormality identified. Chronic changes in the lungs including bronchiectasis, areas of mucous plugging, atelectatic changes and small right and trace left pleural effusions. Hepatobiliary: Liver is normal in size with no suspicious mass identified. Gallbladder is  surgically absent. No significant biliary ductal dilatation appreciated. Pancreas: No suspicious mass or ductal dilatation identified. Spleen: Numerous calcified granulomas.  Normal size. Adrenals/Urinary Tract: Stable thickening of the left adrenal gland. Symmetric perfusion of the kidneys. Mild renal cortical thinning and lobulation bilaterally. A few subcentimeter hypodensities in both kidneys which likely represent cysts, measuring up to 8 mm on the left. No hydronephrosis identified. Urinary bladder appears normal. Stomach/Bowel: No bowel obstruction, free air or pneumatosis. Extensive colonic diverticulosis. Segment of colonic wall thickening and edema with adjacent fat stranding identified in the sigmoid colon. No defined abscess appreciated. Vascular/Lymphatic: Aortic atherosclerosis. No enlarged abdominal or pelvic lymph nodes. Reproductive: Status post hysterectomy. No adnexal masses. Other: Small volume ascites. Musculoskeletal: Degenerative changes in the lumbar spine. No suspicious bony lesions identified. IMPRESSION: 1. Evidence of acute diverticulitis in the sigmoid colon. Recommend follow-up after treatment to ensure resolution. 2. Small volume ascites. 3. Small pleural effusions and chronic  changes in the lower lungs. 4. Other chronic findings as described. Electronically Signed   By: Ofilia Neas M.D.   On: 06/04/2021 16:43   ? ?Physical Exam: ?BP 122/70   Pulse 74   Ht _0  (1.676 m)   Wt 1

## 2021-06-20 NOTE — Progress Notes (Signed)
? ?HPI : Christine Vaughan is a very pleasant 88 year old female with a history of anxiety, breast cancer and chronic bronchitis/MAC pneumonia who I initially saw March 9 of this year for persistent dysphagia with weight loss.  She was noted to have a distal esophageal stenosis which obstructed passage of the barium tablet.  Although she is very frail, she and her family are very bothered by her swallowing difficulties and associated weight loss, and wanted to pursue endoscopic therapy.  She is scheduled for an EGD with dilation next week in the hospital. ?Today, she reports that her dysphagia is about the same as it was when she saw me almost 2 months ago.  She has not had further weight loss, however. ?On April 10 the patient presented to the emergency department with several days of lower abdominal pain.  The pain had gotten progressively worse over a few days prompting her emergency room department visit.  A CT scan was consistent with uncomplicated sigmoid diverticulitis.  She was treated with antibiotics and recommended to follow a liquid diet for a few days.  She did not have any fevers or chills and did not have a leukocytosis.  No nausea or vomiting.  No blood in her stool. ?This is her first documented episode of diverticulitis. ?The patient continues to have problems with constipation, often going only once a week.  She is given Metamucil and her care home [employer]) but states that she cannot drink it fast enough and it becomes "gummed up" and she cannot swallow it.  She does take senna regularly.  She thinks she has taken MiraLAX in the past, but does not currently. ? ? ? ?Past Medical History:  ?Diagnosis Date  ? Anxiety   ? Breast cancer, right ([place]) 2001  ? Right Lumpectomy, chemo + rad tx's.   ? Chronic kidney disease   ? Hereditary and idiopathic peripheral neuropathy 12/27/2015  ? Hypertension   ? MAI (mycobacterium avium-intracellulare) ([place])   ? Thyroid disease   ? ? ? ?Past Surgical  History:  ?Procedure Laterality Date  ? BACK SURGERY    ? BREAST BIOPSY Left   ? surgical bx pt dose not remember  ? BREAST EXCISIONAL BIOPSY Right 2001  ? + chem rad and armidex  ? BREAST LUMPECTOMY  2001  ? BREAST SURGERY    ? CHOLECYSTECTOMY    ? ESOPHAGOGASTRODUODENOSCOPY (EGD) WITH PROPOFOL N/A 09/20/2015  ? Procedure: ESOPHAGOGASTRODUODENOSCOPY (EGD) WITH PROPOFOL;  Surgeon: Lollie Sails, MD;  Location: [facility] ENDOSCOPY;  Service: Endoscopy;  Laterality: N/A;  ? ?Family History  ?Problem Relation Age of Onset  ? Depression Mother   ? Brain cancer Mother   ? Breast cancer Paternal Aunt   ? ?Social History  ? ?Tobacco Use  ? Smoking status: Never  ? Smokeless tobacco: Never  ?Vaping Use  ? Vaping Use: Never used  ?Substance Use Topics  ? Alcohol use: No  ? Drug use: No  ? ?Current Outpatient Medications  ?Medication Sig Dispense Refill  ? acetaminophen (TYLENOL) 500 MG tablet Take 500 mg by mouth every 6 (six) hours as needed for moderate pain or headache.    ? albuterol (VENTOLIN HFA) 108 (90 Base) MCG/ACT inhaler Inhale 2 puffs into the lungs every 6 (six) hours as needed for wheezing or shortness of breath.    ? ALPRAZolam (XANAX) 0.25 MG tablet Take 0.125 mg by mouth 2 (two) times daily as needed for anxiety.    ? aspirin EC 81 MG tablet  Take 81 mg by mouth at bedtime. Swallow whole.    ? docusate sodium (COLACE) 100 MG capsule Take 100 mg by mouth daily.    ? esomeprazole (NEXIUM) 40 MG capsule Take 40 mg by mouth daily at 4 PM.    ? EZALLOR SPRINKLE 10 MG CPSP Take 1 capsule by mouth at bedtime. Open and sprinkle in applesauce    ? gabapentin (NEURONTIN) 100 MG capsule Take 100-200 mg by mouth See admin instructions. 100 mg in the morning ?200 mg at bedtime    ? levothyroxine (SYNTHROID, LEVOTHROID) 50 MCG tablet Take 50 mcg by mouth daily before breakfast.    ? OXYGEN Inhale 2 L into the lungs daily as needed (shortness of breath).    ? Psyllium (METAMUCIL) 0.36 g CAPS Take 1 capsule by mouth at  bedtime.    ? sennosides-docusate sodium (SENOKOT-S) 8.6-50 MG tablet Take 1 tablet by mouth daily as needed for constipation.    ? sertraline (ZOLOFT) 25 MG tablet Take 1 tablet (25 mg total) by mouth daily. 30 tablet 3  ? simethicone (MYLICON) 80 MG chewable tablet Chew 80 mg by mouth at bedtime as needed for flatulence.    ? sodium chloride 1 g tablet Take 1 g by mouth at bedtime.    ? tobramycin, PF, (TOBI) 300 MG/5ML nebulizer solution Take 300 mg by nebulization daily as needed (shortness of breath).    ? zinc oxide 20 % ointment Apply 1 application topically 4 (four) times daily as needed for irritation.    ? ipratropium (ATROVENT) 0.06 % nasal spray Place 2 sprays into the nose 2 (two) times daily as needed for rhinitis.    ? ?No current facility-administered medications for this visit.  ? ?Allergies  ?Allergen Reactions  ? Ciprofloxacin Other (See Comments)  ?  aggravates her neuropathy  ? Elemental Sulfur Other (See Comments)  ?  Doesn't remember  ? Erythromycin Other (See Comments)  ?  Gi distress  ? Metronidazole   ?  Other reaction(s): Abdominal Pain  ? Tizanidine Other (See Comments)  ?  GI upset  ? Penicillins Rash  ?  Has patient had a PCN reaction causing immediate rash, facial/tongue/throat swelling, SOB or lightheadedness with hypotension: No ?Has patient had a PCN reaction causing severe rash involving mucus membranes or skin necrosis: No ?Has patient had a PCN reaction that required hospitalization No ?Has patient had a PCN reaction occurring within the last 10 years: no ?If all of the above answers are "NO", then may proceed with Cephalosporin use.  ? Sulfamethoxazole Rash  ?  all over body  ? ? ? ?Review of Systems: ?All systems reviewed and negative except where noted in HPI.  ? ? ?CT ABDOMEN PELVIS W CONTRAST ? ?Result Date: 06/04/2021 ?CLINICAL DATA:  Abdominal pain EXAM: CT ABDOMEN AND PELVIS WITH CONTRAST TECHNIQUE: Multidetector CT imaging of the abdomen and pelvis was performed using the  standard protocol following bolus administration of intravenous contrast. RADIATION DOSE REDUCTION: This exam was performed according to the departmental dose-optimization program which includes automated exposure control, adjustment of the mA and/or kV according to patient size and/or use of iterative reconstruction technique. CONTRAST:  52m OMNIPAQUE IOHEXOL 300 MG/ML  SOLN COMPARISON:  CT abdomen and pelvis 12/23/2020 FINDINGS: Lower chest: No acute abnormality identified. Chronic changes in the lungs including bronchiectasis, areas of mucous plugging, atelectatic changes and small right and trace left pleural effusions. Hepatobiliary: Liver is normal in size with no suspicious mass identified. Gallbladder is  surgically absent. No significant biliary ductal dilatation appreciated. Pancreas: No suspicious mass or ductal dilatation identified. Spleen: Numerous calcified granulomas.  Normal size. Adrenals/Urinary Tract: Stable thickening of the left adrenal gland. Symmetric perfusion of the kidneys. Mild renal cortical thinning and lobulation bilaterally. A few subcentimeter hypodensities in both kidneys which likely represent cysts, measuring up to 8 mm on the left. No hydronephrosis identified. Urinary bladder appears normal. Stomach/Bowel: No bowel obstruction, free air or pneumatosis. Extensive colonic diverticulosis. Segment of colonic wall thickening and edema with adjacent fat stranding identified in the sigmoid colon. No defined abscess appreciated. Vascular/Lymphatic: Aortic atherosclerosis. No enlarged abdominal or pelvic lymph nodes. Reproductive: Status post hysterectomy. No adnexal masses. Other: Small volume ascites. Musculoskeletal: Degenerative changes in the lumbar spine. No suspicious bony lesions identified. IMPRESSION: 1. Evidence of acute diverticulitis in the sigmoid colon. Recommend follow-up after treatment to ensure resolution. 2. Small volume ascites. 3. Small pleural effusions and chronic  changes in the lower lungs. 4. Other chronic findings as described. Electronically Signed   By: Ofilia Neas M.D.   On: 06/04/2021 16:43   ? ?Physical Exam: ?BP 122/70   Pulse 74   Ht _0  (1.676 m)   Wt 1

## 2021-06-20 NOTE — Patient Instructions (Signed)
If you are age 86 or older, your body mass index should be between 23-30. Your Body mass index is 16.32 kg/m?[name] If this is out of the aforementioned range listed, please consider follow up with your Primary Care Provider. ? ?__________________________________________________________ ? ?The [facility] providers would like to encourage you to use [facility] to communicate with providers for non-urgent requests or questions.  Due to long hold times on the telephone, sending your provider a message by [facility] may be a faster and more efficient way to get a response.  Please allow 48 business hours for a response.  Please remember that this is for non-urgent requests.  ? ?Take Miralax daily. Increase 2 times if needed. ? ?Take Senna as needed. ? ?Thank you for choosing me and [facility]. ? ?Scott E. Candis Schatz ? ?

## 2021-06-25 ENCOUNTER — Encounter (HOSPITAL_COMMUNITY): Payer: Self-pay | Admitting: Gastroenterology

## 2021-06-28 ENCOUNTER — Encounter (HOSPITAL_COMMUNITY)
Admission: RE | Disposition: A | Discharge status: Home / Self Care | Payer: Self-pay | Source: Home / Self Care | Attending: Gastroenterology

## 2021-06-28 ENCOUNTER — Encounter (HOSPITAL_COMMUNITY): Payer: Self-pay | Admitting: Gastroenterology

## 2021-06-28 ENCOUNTER — Ambulatory Visit (HOSPITAL_BASED_OUTPATIENT_CLINIC_OR_DEPARTMENT_OTHER): Payer: Medicare PPO | Admitting: Certified Registered"

## 2021-06-28 ENCOUNTER — Ambulatory Visit (HOSPITAL_COMMUNITY)
Admission: RE | Admit: 2021-06-28 | Discharge: 2021-06-28 | Disposition: A | Discharge status: Home / Self Care | Payer: Medicare PPO | Attending: Gastroenterology | Admitting: Gastroenterology

## 2021-06-28 ENCOUNTER — Ambulatory Visit (HOSPITAL_COMMUNITY): Payer: Medicare PPO | Admitting: Certified Registered"

## 2021-06-28 ENCOUNTER — Other Ambulatory Visit: Payer: Self-pay

## 2021-06-28 DIAGNOSIS — R1312 Dysphagia, oropharyngeal phase: Secondary | ICD-10-CM | POA: Diagnosis not present

## 2021-06-28 DIAGNOSIS — R634 Abnormal weight loss: Secondary | ICD-10-CM | POA: Diagnosis present

## 2021-06-28 DIAGNOSIS — K3189 Other diseases of stomach and duodenum: Secondary | ICD-10-CM

## 2021-06-28 DIAGNOSIS — N189 Chronic kidney disease, unspecified: Secondary | ICD-10-CM | POA: Diagnosis not present

## 2021-06-28 DIAGNOSIS — R1310 Dysphagia, unspecified: Secondary | ICD-10-CM

## 2021-06-28 DIAGNOSIS — Z79899 Other long term (current) drug therapy: Secondary | ICD-10-CM | POA: Insufficient documentation

## 2021-06-28 DIAGNOSIS — K5900 Constipation, unspecified: Secondary | ICD-10-CM | POA: Diagnosis not present

## 2021-06-28 DIAGNOSIS — E039 Hypothyroidism, unspecified: Secondary | ICD-10-CM

## 2021-06-28 DIAGNOSIS — K2980 Duodenitis without bleeding: Secondary | ICD-10-CM | POA: Diagnosis not present

## 2021-06-28 DIAGNOSIS — K317 Polyp of stomach and duodenum: Secondary | ICD-10-CM

## 2021-06-28 DIAGNOSIS — I10 Essential (primary) hypertension: Secondary | ICD-10-CM

## 2021-06-28 DIAGNOSIS — Z853 Personal history of malignant neoplasm of breast: Secondary | ICD-10-CM | POA: Diagnosis not present

## 2021-06-28 DIAGNOSIS — F419 Anxiety disorder, unspecified: Secondary | ICD-10-CM | POA: Insufficient documentation

## 2021-06-28 DIAGNOSIS — I129 Hypertensive chronic kidney disease with stage 1 through stage 4 chronic kidney disease, or unspecified chronic kidney disease: Secondary | ICD-10-CM | POA: Diagnosis not present

## 2021-06-28 DIAGNOSIS — Z681 Body mass index (BMI) 19 or less, adult: Secondary | ICD-10-CM | POA: Diagnosis not present

## 2021-06-28 HISTORY — PX: ESOPHAGOGASTRODUODENOSCOPY (EGD) WITH PROPOFOL: SHX5813

## 2021-06-28 HISTORY — PX: BIOPSY: SHX5522

## 2021-06-28 HISTORY — PX: POLYPECTOMY: SHX5525

## 2021-06-28 HISTORY — PX: ESOPHAGEAL DILATION: SHX303

## 2021-06-28 SURGERY — ESOPHAGOGASTRODUODENOSCOPY (EGD) WITH PROPOFOL
Anesthesia: Monitor Anesthesia Care

## 2021-06-28 MED ORDER — SODIUM CHLORIDE 0.9 % IV SOLN: INTRAVENOUS | Status: DC

## 2021-06-28 MED ORDER — LACTATED RINGERS IV SOLN: INTRAVENOUS | Status: DC | PRN

## 2021-06-28 MED ORDER — PROPOFOL 500 MG/50ML IV EMUL
INTRAVENOUS | Status: AC
  Filled 2021-06-28: qty 50

## 2021-06-28 MED ORDER — PROPOFOL 10 MG/ML IV BOLUS
INTRAVENOUS | Status: DC | PRN
  Administered 2021-06-28 (×2): 10 mg via INTRAVENOUS

## 2021-06-28 MED ORDER — PROPOFOL 500 MG/50ML IV EMUL
INTRAVENOUS | Status: DC | PRN
  Administered 2021-06-28: 110 ug/kg/min via INTRAVENOUS

## 2021-06-28 SURGICAL SUPPLY — 15 items

## 2021-06-28 NOTE — Op Note (Signed)
[facility] ?Patient Name: Christine Vaughan ?Procedure Date: 06/28/2021 ?MRN: [id number] ?Attending MD: Gladstone Pih. Candis Schatz , MD ?Date of Birth: Sep 21, 1931 ?CSN: [id number] ?Age: 88 ?Admit Type: Outpatient ?Procedure:                Upper GI endoscopy ?Indications:              Dysphagia, Weight loss, abnormal barium esophagram ?Providers:                Nicki Reaper E. Candis Schatz, MD, Mikey College, RN,  ?                          Despina Pole, Technician, Maudry Diego,  ?                          CRNA ?Referring MD:              ?Medicines:                Monitored Anesthesia Care ?Complications:            No immediate complications. ?Estimated Blood Loss:     Estimated blood loss was minimal. ?Procedure:                Pre-Anesthesia Assessment: ?                          - Prior to the procedure, a History and Physical  ?                          was performed, and patient medications and  ?                          allergies were reviewed. The patient's tolerance of  ?                          previous anesthesia was also reviewed. The risks  ?                          and benefits of the procedure and the sedation  ?                          options and risks were discussed with the patient.  ?                          All questions were answered, and informed consent  ?                          was obtained. Prior Anticoagulants: The patient has  ?                          taken no previous anticoagulant or antiplatelet  ?                          agents except for aspirin. ASA Grade Assessment:  ?  III - A patient with severe systemic disease. After  ?                          reviewing the risks and benefits, the patient was  ?                          deemed in satisfactory condition to undergo the  ?                          procedure. ?                          After obtaining informed consent, the endoscope was  ?                          passed under direct  vision. Throughout the  ?                          procedure, the patient's blood pressure, pulse, and  ?                          oxygen saturations were monitored continuously. The  ?                          [id number]-[id number] ([id number]) Olympus endoscope was introduced  ?                          through the mouth, and advanced to the third part  ?                          of duodenum. The upper GI endoscopy was  ?                          accomplished without difficulty. The patient  ?                          tolerated the procedure well. ?Scope In: ?Scope Out: ?Findings: ?     The examined portions of the nasopharynx, oropharynx and larynx were  ?     normal. ?     The examined esophagus was normal. There was no obvious stricture or  ?     stenosis. No evidence of esophagitis. The esophageal mucosa appeared  ?     normal. A TTS dilator was passed through the scope. Dilation with an  ?     18-19-20 mm balloon dilator was performed to 20 mm (dilation was first  ?     attempted with 15-18 mm balloon with no mucosal rent). The dilation site  ?     was examined and showed mild mucosal disruption. Estimated blood loss  ?     was minimal. ?     Multiple small sessile polyps with no stigmata of recent bleeding were  ?     found in the gastric fundus and in the gastric body. Four of these  ?     polyps were removed with a cold biopsy forceps. Resection and retrieval  ?  were complete. Estimated blood loss was minimal. ?     The exam of the stomach was otherwise normal. The cardia appeared normal. ?     Patchy mildly erythematous mucosa without active bleeding and with no  ?     stigmata of bleeding was found in the second portion of the duodenum.  ?     Biopsies were taken with a cold forceps for histology. Estimated blood  ?     loss was minimal. ?     The exam of the duodenum was otherwise normal. ?Impression:               - The examined portions of the nasopharynx,  ?                          oropharynx and larynx were  normal. ?                          - Normal esophagus. No obvious stricture. Dilated  ?                          with maximal sized balloon (3m). ?                          - Multiple gastric polyps. Resected and retrieved.  ?                          Suspect fundic gland polyps. ?                          - Erythematous duodenopathy. Biopsied. ?Moderate Sedation: ?     Not Applicable - Patient had care per Anesthesia. ?Recommendation:           - Patient has a contact number available for  ?                          emergencies. The signs and symptoms of potential  ?                          delayed complications were discussed with the  ?                          patient. Return to normal activities tomorrow.  ?                          Written discharge instructions were provided to the  ?                          patient. ?                          - Resume previous diet. ?                          - Continue present medications. ?                          - Await pathology results. ?                          -  Unclear etiology of patient's dysphagia and  ?                          retained barium table on barium swallow. Consider  ?                          further evaluation with manometry if symptoms not  ?                          improved following dilation. ?Procedure Code(s):        --- Professional --- ?                          [phone], Esophagogastroduodenoscopy, flexible,  ?                          transoral; with transendoscopic balloon dilation of  ?                          esophagus (less than 30 mm diameter) ?Diagnosis Code(s):        --- Professional --- ?                          K31.7, Polyp of stomach and duodenum ?                          K31.89, Other diseases of stomach and duodenum ?                          R13.10, Dysphagia, unspecified ?                          R63.4, Abnormal weight loss ?CPT copyright 2019 [employer]. All rights reserved. ?The codes documented in  this report are preliminary and upon coder review may  ?be revised to meet current compliance requirements. ?Sharunda Salmon E. Candis Schatz, MD ?06/28/2021 10:59:05 AM ?This report has been signed electronically. ?Number of Addenda: 0 ?

## 2021-06-28 NOTE — Anesthesia Postprocedure Evaluation (Signed)
Anesthesia Post Note ? ?Patient: TAWAN CORKERN ? ?Procedure(s) Performed: ESOPHAGOGASTRODUODENOSCOPY (EGD) WITH PROPOFOL ?ESOPHAGEAL DILATION ? ?  ? ?Patient location during evaluation: PACU ?Anesthesia Type: MAC ?Level of consciousness: awake and alert ?Pain management: pain level controlled ?Vital Signs Assessment: post-procedure vital signs reviewed and stable ?Respiratory status: spontaneous breathing, nonlabored ventilation, respiratory function stable and patient connected to nasal cannula oxygen ?Cardiovascular status: stable and blood pressure returned to baseline ?Postop Assessment: no apparent nausea or vomiting ?Anesthetic complications: no ? ? ?No notable events documented. ? ?Last Vitals:  ?Vitals:  ? 06/28/21 1100 06/28/21 1118  ?BP: 130/63 (!) 145/66  ?Pulse: 71 73  ?Resp: (!) 23 (!) 21  ?Temp:    ?SpO2: 100% 99%  ?  ?Last Pain:  ?Vitals:  ? 06/28/21 1118  ?TempSrc:   ?PainSc: 0-No pain  ? ? ?  ?  ?  ?  ?  ?  ? ?Gage Weant S ? ? ? ? ?

## 2021-06-28 NOTE — Anesthesia Procedure Notes (Signed)
Procedure Name: [place] ?Date/Time: 06/28/2021 10:22 AM ?Performed by: Niel Hummer, CRNA ?Pre-anesthesia Checklist: Patient identified, Emergency Drugs available, Patient being monitored and Suction available ?Oxygen Delivery Method: Simple face mask ? ? ? ? ?

## 2021-06-28 NOTE — Transfer of Care (Signed)
Immediate Anesthesia Transfer of Care Note ? ?Patient: Christine Vaughan ? ?Procedure(s) Performed: ESOPHAGOGASTRODUODENOSCOPY (EGD) WITH PROPOFOL ? ?Patient Location: PACU ? ?Anesthesia Type:MAC ? ?Level of Consciousness: drowsy ? ?Airway & Oxygen Therapy: Patient Spontanous Breathing and Patient connected to face mask oxygen ? ?Post-op Assessment: Report given to RN, Post -op Vital signs reviewed and stable and Patient moving all extremities X 4 ? ?Post vital signs: Reviewed and stable ? ?Last Vitals:  ?Vitals Value Taken Time  ?BP 117/53   ?Temp    ?Pulse 69   ?Resp 16   ?SpO2 100   ? ? ?Last Pain:  ?Vitals:  ? 06/28/21 0848  ?TempSrc: Temporal  ?PainSc: 0-No pain  ?   ? ?  ? ?Complications: No notable events documented. ?

## 2021-06-28 NOTE — Interval H&P Note (Signed)
History and Physical Interval Note: ? ?06/28/2021 ?10:21 AM ? ?[name]  has presented today for surgery, with the diagnosis of Dysphagia, Weight loss.  The various methods of treatment have been discussed with the patient and family. After consideration of risks, benefits and other options for treatment, the patient has consented to  Procedure(s): ?ESOPHAGOGASTRODUODENOSCOPY (EGD) WITH PROPOFOL (N/A) as a surgical intervention.  The patient's history has been reviewed, patient examined, no change in status, stable for surgery.  I have reviewed the patient's chart and labs.  Questions were answered to the patient's satisfaction.   ? ? ?Christine Vaughan ? ? ?

## 2021-06-28 NOTE — Discharge Instructions (Signed)
YOU HAD AN ENDOSCOPIC PROCEDURE TODAY: Refer to the procedure report and other information in the discharge instructions given to you for any specific questions about what was found during the examination. If this information does not answer your questions, please call [place] office at [phone] to clarify.  ° °YOU SHOULD EXPECT: Some feelings of bloating in the abdomen. Passage of more gas than usual. Walking can help get rid of the air that was put into your GI tract during the procedure and reduce the bloating. If you had a lower endoscopy (such as a colonoscopy or flexible sigmoidoscopy) you may notice spotting of blood in your stool or on the toilet paper. Some abdominal soreness may be present for a day or two, also. ° °DIET: Your first meal following the procedure should be a light meal and then it is ok to progress to your normal diet. A half-sandwich or bowl of soup is an example of a good first meal. Heavy or fried foods are harder to digest and may make you feel nauseous or bloated. Drink plenty of fluids but you should avoid alcoholic beverages for 24 hours. If you had a esophageal dilation, please see attached instructions for diet.   ° °ACTIVITY: Your care partner should take you home directly after the procedure. You should plan to take it easy, moving slowly for the rest of the day. You can resume normal activity the day after the procedure however YOU SHOULD NOT DRIVE, use power tools, machinery or perform tasks that involve climbing or major physical exertion for 24 hours (because of the sedation medicines used during the test).  ° °SYMPTOMS TO REPORT IMMEDIATELY: °A gastroenterologist can be reached at any hour. Please call [phone]  for any of the following symptoms:  °Following lower endoscopy (colonoscopy, flexible sigmoidoscopy) °Excessive amounts of blood in the stool  °Significant tenderness, worsening of abdominal pains  °Swelling of the abdomen that is new, acute  °Fever of 100° or  higher  °Following upper endoscopy (EGD, EUS, ERCP, esophageal dilation) °Vomiting of blood or coffee ground material  °New, significant abdominal pain  °New, significant chest pain or pain under the shoulder blades  °Painful or persistently difficult swallowing  °New shortness of breath  °Black, tarry-looking or red, bloody stools ° °FOLLOW UP:  °If any biopsies were taken you will be contacted by phone or by letter within the next 1-3 weeks. Call [phone]  if you have not heard about the biopsies in 3 weeks.  °Please also call with any specific questions about appointments or follow up tests. ° °

## 2021-06-28 NOTE — Anesthesia Preprocedure Evaluation (Signed)
Anesthesia Evaluation  ?Patient identified by MRN, date of birth, ID band ?Patient awake ? ? ? ?Reviewed: ?Allergy & Precautions, NPO status , Patient's Chart, lab work & pertinent test results ? ?Airway ?Mallampati: II ? ?TM Distance: >3 FB ?Neck ROM: Full ? ? ? Dental ?no notable dental hx. ? ?  ?Pulmonary ?neg pulmonary ROS,  ?  ?Pulmonary exam normal ?breath sounds clear to auscultation ? ? ? ? ? ? Cardiovascular ?hypertension, Pt. on medications ?Normal cardiovascular exam ?Rhythm:Regular Rate:Normal ? ? ?  ?Neuro/Psych ?negative neurological ROS ? negative psych ROS  ? GI/Hepatic ?negative GI ROS, Neg liver ROS,   ?Endo/Other  ?Hypothyroidism  ? Renal/GU ?Renal InsufficiencyRenal disease  ?negative genitourinary ?  ?Musculoskeletal ?negative musculoskeletal ROS ?(+)  ? Abdominal ?  ?Peds ?negative pediatric ROS ?(+)  Hematology ?negative hematology ROS ?(+)   ?Anesthesia Other Findings ? ? Reproductive/Obstetrics ?negative OB ROS ? ?  ? ? ? ? ? ? ? ? ? ? ? ? ? ?  ?  ? ? ? ? ? ? ? ? ?Anesthesia Physical ?Anesthesia Plan ? ?ASA: 3 ? ?Anesthesia Plan: MAC  ? ?Post-op Pain Management: Minimal or no pain anticipated  ? ?Induction: Intravenous ? ?PONV Risk Score and Plan: 2 and Propofol infusion and Treatment may vary due to age or medical condition ? ?Airway Management Planned: Simple Face Mask ? ?Additional Equipment:  ? ?Intra-op Plan:  ? ?Post-operative Plan:  ? ?Informed Consent: I have reviewed the patients History and Physical, chart, labs and discussed the procedure including the risks, benefits and alternatives for the proposed anesthesia with the patient or authorized representative who has indicated his/her understanding and acceptance.  ? ? ? ?Dental advisory given ? ?Plan Discussed with: CRNA and Surgeon ? ?Anesthesia Plan Comments:   ? ? ? ? ? ? ?Anesthesia Quick Evaluation ? ?

## 2021-06-29 ENCOUNTER — Encounter (HOSPITAL_COMMUNITY): Payer: Self-pay | Admitting: Gastroenterology

## 2021-06-29 LAB — SURGICAL PATHOLOGY

## 2021-07-02 NOTE — Progress Notes (Signed)
Christine Vaughan,  ?The biopsies of your duodenum showed nonspecific inflammatory changes (duodenitis).  This can be secondary to medications such as NSAIDs (nonsteroidal anti-inflammatory drugs) or can be related to stomach acid exposure.  No specific treatment is recommended.  No evidence of celiac disease was noted. ? 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  The polyps are not considered precancerous and no further surveillance or evaluation is needed. ? ?Please follow up with me as needed for ongoing management of constipation or to reconsider esophageal manometry to further evaluate your swallowing.

## 2021-07-13 ENCOUNTER — Emergency Department (HOSPITAL_BASED_OUTPATIENT_CLINIC_OR_DEPARTMENT_OTHER)
Admission: EM | Admit: 2021-07-13 | Discharge: 2021-07-13 | Disposition: A | Discharge status: Other Acute Inpatient Hospital | Payer: Medicare PPO | Attending: Emergency Medicine | Admitting: Emergency Medicine

## 2021-07-13 ENCOUNTER — Encounter (HOSPITAL_BASED_OUTPATIENT_CLINIC_OR_DEPARTMENT_OTHER): Payer: Self-pay

## 2021-07-13 DIAGNOSIS — K089 Disorder of teeth and supporting structures, unspecified: Secondary | ICD-10-CM | POA: Insufficient documentation

## 2021-07-13 DIAGNOSIS — Z7982 Long term (current) use of aspirin: Secondary | ICD-10-CM | POA: Diagnosis not present

## 2021-07-13 DIAGNOSIS — R220 Localized swelling, mass and lump, head: Secondary | ICD-10-CM | POA: Diagnosis present

## 2021-07-13 LAB — CBC WITH DIFFERENTIAL/PLATELET
Abs Immature Granulocytes: 0.01 10*3/uL (ref 0.00–0.07)
Basophils Absolute: 0 10*3/uL (ref 0.0–0.1)
Basophils Relative: 1 %
Eosinophils Absolute: 0.1 10*3/uL (ref 0.0–0.5)
Eosinophils Relative: 2 %
HCT: 36.3 % (ref 36.0–46.0)
Hemoglobin: 11.5 g/dL — ABNORMAL LOW (ref 12.0–15.0)
Immature Granulocytes: 0 %
Lymphocytes Relative: 10 %
Lymphs Abs: 0.6 10*3/uL — ABNORMAL LOW (ref 0.7–4.0)
MCH: 31.1 pg (ref 26.0–34.0)
MCHC: 31.7 g/dL (ref 30.0–36.0)
MCV: 98.1 fL (ref 80.0–100.0)
Monocytes Absolute: 0.5 10*3/uL (ref 0.1–1.0)
Monocytes Relative: 9 %
Neutro Abs: 4.8 10*3/uL (ref 1.7–7.7)
Neutrophils Relative %: 78 %
Platelets: 242 10*3/uL (ref 150–400)
RBC: 3.7 MIL/uL — ABNORMAL LOW (ref 3.87–5.11)
RDW: 15.2 % (ref 11.5–15.5)
WBC: 6.1 10*3/uL (ref 4.0–10.5)
nRBC: 0 % (ref 0.0–0.2)

## 2021-07-13 LAB — COMPREHENSIVE METABOLIC PANEL
ALT: 8 U/L (ref 0–44)
AST: 19 U/L (ref 15–41)
Albumin: 4 g/dL (ref 3.5–5.0)
Alkaline Phosphatase: 66 U/L (ref 38–126)
Anion gap: 8 (ref 5–15)
BUN: 15 mg/dL (ref 8–23)
CO2: 30 mmol/L (ref 22–32)
Calcium: 9.5 mg/dL (ref 8.9–10.3)
Chloride: 99 mmol/L (ref 98–111)
Creatinine, Ser: 0.82 mg/dL (ref 0.44–1.00)
GFR, Estimated: >60 mL/min (ref >60)
Glucose, Bld: 67 mg/dL — ABNORMAL LOW (ref 70–99)
Potassium: 3.9 mmol/L (ref 3.5–5.1)
Sodium: 137 mmol/L (ref 135–145)
Total Bilirubin: 0.9 mg/dL (ref 0.3–1.2)
Total Protein: 7 g/dL (ref 6.5–8.1)

## 2021-07-13 MED ORDER — METRONIDAZOLE 500 MG PO TABS: 500.0000 mg | ORAL_TABLET | Freq: Two times a day (BID) | ORAL | 0 refills | Status: DC

## 2021-07-13 MED ORDER — CEPHALEXIN 250 MG PO CAPS
500.0000 mg | ORAL_CAPSULE | Freq: Once | ORAL | Status: AC
Start: 2021-07-13 — End: 2021-07-13
  Administered 2021-07-13: 500 mg via ORAL
  Filled 2021-07-13: qty 2

## 2021-07-13 MED ORDER — CEPHALEXIN 500 MG PO CAPS: 500.0000 mg | ORAL_CAPSULE | Freq: Three times a day (TID) | ORAL | 0 refills | Status: DC

## 2021-07-13 MED ORDER — METRONIDAZOLE 500 MG PO TABS
500.0000 mg | ORAL_TABLET | Freq: Once | ORAL | Status: AC
Start: 2021-07-13 — End: 2021-07-13
  Administered 2021-07-13: 500 mg via ORAL
  Filled 2021-07-13: qty 1

## 2021-07-13 NOTE — Discharge Instructions (Signed)
Please return to ED if swelling worsens and you develop shortness of breath, tongue swelling, or difficulty breathing.  Otherwise, follow up with your dentist next week.  Antibiotics sent to pharmacy

## 2021-07-13 NOTE — ED Triage Notes (Signed)
Pt presents to ED from ALF accompanied by family C/I L facial swelling since last night. Pt denies recent tooth infection that she knows of.

## 2021-07-13 NOTE — ED Provider Notes (Signed)
[facility] EMERGENCY DEPT Provider Note   CSN: [id number] Arrival date & time: 07/13/21  1055     History  Chief Complaint  Patient presents with   Facial Swelling    Christine Vaughan is a 88 y.o. female.  Patient is a 88 yo female presenting for facial swelling. Pt admits to left sided facial swelling that started last night. Denies sob or difficulty breathing. Admits to poor dentition and called dentist prior to arrival but unable to be seen. Not able to be seen today. No recent antibiotic use.   The history is provided by the patient. No language interpreter was used.      Home Medications Prior to Admission medications   Medication Sig Start Date End Date Taking? Authorizing Provider  cephALEXin (KEFLEX) 500 MG capsule Take 1 capsule (500 mg total) by mouth 3 (three) times daily. 1/85/63  Yes Campbell Stall P, DO  metroNIDAZOLE (FLAGYL) 500 MG tablet Take 1 tablet (500 mg total) by mouth 2 (two) times daily. 1/49/70  Yes Campbell Stall P, DO  acetaminophen (TYLENOL) 500 MG tablet Take 500 mg by mouth every 6 (six) hours as needed for moderate pain or headache.    [provider]  albuterol (VENTOLIN HFA) 108 (90 Base) MCG/ACT inhaler Inhale 2 puffs into the lungs every 6 (six) hours as needed for wheezing or shortness of breath.    [provider]  ALPRAZolam Duanne Moron) 0.25 MG tablet Take 0.125 mg by mouth 2 (two) times daily.    [provider]  aspirin EC 81 MG tablet Take 81 mg by mouth at bedtime. Swallow whole.    [provider]  esomeprazole (NEXIUM) 40 MG capsule Take 40 mg by mouth daily at 4 PM. 12/24/16   [provider]  EZALLOR SPRINKLE 10 MG CPSP Take 10 mg by mouth at bedtime. Open and sprinkle in applesauce/ Vanilla chocolate pudding 12/11/20   [provider]  gabapentin (NEURONTIN) 100 MG capsule Take 100-200 mg by mouth See admin instructions. Take 100 mg in the morning and 200 mg at bedtime     [provider]  ipratropium (ATROVENT) 0.06 % nasal spray Place 2 sprays into the nose 2 (two) times daily as needed for rhinitis. 03/19/18 06/26/21  [provider]  ipratropium-albuterol (DUONEB) 0.5-2.5 (3) MG/3ML SOLN Take 3 mLs by nebulization every 4 (four) hours as needed (bronchitis).    [provider]  levothyroxine (SYNTHROID, LEVOTHROID) 50 MCG tablet Take 50 mcg by mouth daily before breakfast.    [provider]  METAMUCIL FIBER PO Take 2 capsules by mouth daily.    [provider]  OXYGEN Inhale 2 L into the lungs daily as needed (shortness of breath).    [provider]  Psyllium (METAMUCIL PO) Take 15 mLs by mouth daily as needed. With water or Juice    [provider]  rosuvastatin (CRESTOR) 10 MG tablet Take 10 mg by mouth daily.    [provider]  sennosides-docusate sodium (SENOKOT-S) 8.6-50 MG tablet Take 1 tablet by mouth daily.    [provider]  sertraline (ZOLOFT) 25 MG tablet Take 1 tablet (25 mg total) by mouth daily. Patient taking differently: Take 25 mg by mouth at bedtime. 11/09/18   Rainey Pines, MD  sodium chloride 1 g tablet Take 1 g by mouth See admin instructions. 1800    [provider]  tobramycin, PF, (TOBI) 300 MG/5ML nebulizer solution Take 300 mg by nebulization daily as  needed (shortness of breath).    [provider]  zinc oxide 20 % ointment Apply 1 application topically 4 (four) times daily as needed for irritation.    [provider]      Allergies    Ciprofloxacin, Elemental sulfur, Erythromycin, Metronidazole, Tizanidine, Penicillins, and Sulfamethoxazole    Review of Systems   Review of Systems  Constitutional:  Negative for chills and fever.  HENT:  Positive for facial swelling. Negative for ear pain and sore throat.   Eyes:  Negative for pain and visual disturbance.  Respiratory:  Negative for cough and shortness of breath.    Cardiovascular:  Negative for chest pain and palpitations.  Gastrointestinal:  Negative for abdominal pain and vomiting.  Genitourinary:  Negative for dysuria and hematuria.  Musculoskeletal:  Negative for arthralgias and back pain.  Skin:  Negative for color change and rash.  Neurological:  Negative for seizures and syncope.  All other systems reviewed and are negative.  Physical Exam Updated Vital Signs BP (!) 157/77 (BP Location: Left Arm)   Pulse 75   Temp 98.7 F (37.1 C) (Oral)   Resp 17   Ht '5\' 6"'$  (1.676 m)   Wt 45.4 kg   SpO2 99%   BMI 16.14 kg/m  Physical Exam Vitals and nursing note reviewed.  Constitutional:      General: She is not in acute distress.    Appearance: She is well-developed.  HENT:     Head: Atraumatic.      Mouth/Throat:     Lips: Pink.     Mouth: Mucous membranes are moist.     Dentition: Abnormal dentition.   Eyes:     Conjunctiva/sclera: Conjunctivae normal.  Cardiovascular:     Rate and Rhythm: Normal rate and regular rhythm.     Heart sounds: No murmur heard. Pulmonary:     Effort: Pulmonary effort is normal. No respiratory distress.     Breath sounds: Normal breath sounds.  Abdominal:     Palpations: Abdomen is soft.     Tenderness: There is no abdominal tenderness.  Musculoskeletal:        General: No swelling.     Cervical back: Neck supple.  Skin:    General: Skin is warm and dry.     Capillary Refill: Capillary refill takes less than 2 seconds.  Neurological:     Mental Status: She is alert.  Psychiatric:        Mood and Affect: Mood normal.    ED Results / Procedures / Treatments   Labs (all labs ordered are listed, but only abnormal results are displayed) Labs Reviewed  COMPREHENSIVE METABOLIC PANEL - Abnormal; Notable for the following components:      Result Value   Glucose, Bld 67 (*)    All other components within normal limits  CBC WITH DIFFERENTIAL/PLATELET - Abnormal; Notable for the following components:    RBC 3.70 (*)    Hemoglobin 11.5 (*)    Lymphs Abs 0.6 (*)    All other components within normal limits    EKG None  Radiology No results found.  Procedures Procedures    Medications Ordered in ED Medications  cephALEXin (KEFLEX) capsule 500 mg (500 mg Oral Given 07/13/21 1347)  metroNIDAZOLE (FLAGYL) tablet 500 mg (500 mg Oral Given 07/13/21 1347)    ED Course/ Medical Decision Making/ A&P  Medical Decision Making Amount and/or Complexity of Data Reviewed Labs: ordered.  Risk Prescription drug management.   3:38 PM  88 yo female presenting for facial swelling.  Patient is alert and oriented x3, no acute distress, afebrile, stable vital signs.  Physical exam demonstrates poor dentition with edematous gingiva of teeth 6 through 8.  Patient has left-sided facial swelling near the nasolabial fold.  No trismus, difficulty swallowing, drooling, or tongue swelling.  Biotics given in the emergency room and sent to pharmacy.  Patient has multiple drug allergies we discussed her options in detail with pharmacy to try to find her best option.  Patient recommended for close follow-up with her dentist early next week for poor dentition.  Recommended to return immediately to emergency department for any worsening concerning signs or symptoms including difficulty swallowing, drooling, tongue swelling, or difficulty breathing.  Patient in no distress and overall condition improved here in the ED. Detailed discussions were had with the patient regarding current findings, and need for close f/u with PCP or on call doctor. The patient has been instructed to return immediately if the symptoms worsen in any way for re-evaluation. Patient verbalized understanding and is in agreement with current care plan. All questions answered prior to discharge.         Final Clinical Impression(s) / ED Diagnoses Final diagnoses:  Facial swelling  Poor dentition    Rx / DC  Orders ED Discharge Orders          Ordered    cephALEXin (KEFLEX) 500 MG capsule  3 times daily        07/13/21 1343    metroNIDAZOLE (FLAGYL) 500 MG tablet  2 times daily        07/13/21 1343              Lianne Cure, DO 63/84/66 1538

## 2021-07-13 NOTE — ED Notes (Signed)
Attempted X 2 for blood draw without success.

## 2021-07-13 NOTE — ED Notes (Signed)
MD at the Bedside. 

## 2021-08-14 ENCOUNTER — Telehealth: Payer: Self-pay | Admitting: Gastroenterology

## 2021-08-14 NOTE — Telephone Encounter (Signed)
Inbound call from patients daughter Jenny Reichmann stating that patient is having severe issues with GERD. Daughter stated that patient is getting to the point where she feels like she is choking. Patient was scheduled to see Dr. Candis Schatz on 7/28 at 2:10. Daughter is requesting a call back to discuss issues and to see if there is anyway possible to get her in sooner. Please advise.

## 2021-08-14 NOTE — Telephone Encounter (Signed)
Attempted to return daughters phone call and get a fast busy signal. Will try again.  Spoke with pts daughter and she reports her mother is having issues with foamy, thick regurgitation and gerd. Requesting pt be seen sooner than scheduled visit with [facility]. Pt scheduled to see Amy Esterwood PA 08/29/21 at 1:30pm. Pts daughter aware of appt.

## 2021-08-26 ENCOUNTER — Other Ambulatory Visit: Payer: Self-pay

## 2021-08-26 ENCOUNTER — Emergency Department (HOSPITAL_BASED_OUTPATIENT_CLINIC_OR_DEPARTMENT_OTHER): Payer: Medicare PPO

## 2021-08-26 ENCOUNTER — Emergency Department (HOSPITAL_BASED_OUTPATIENT_CLINIC_OR_DEPARTMENT_OTHER)
Admission: EM | Admit: 2021-08-26 | Discharge: 2021-08-26 | Disposition: A | Discharge status: Home / Self Care | Payer: Medicare PPO | Attending: Emergency Medicine | Admitting: Emergency Medicine

## 2021-08-26 DIAGNOSIS — Z7982 Long term (current) use of aspirin: Secondary | ICD-10-CM | POA: Diagnosis not present

## 2021-08-26 DIAGNOSIS — R600 Localized edema: Secondary | ICD-10-CM | POA: Diagnosis not present

## 2021-08-26 DIAGNOSIS — R0602 Shortness of breath: Secondary | ICD-10-CM

## 2021-08-26 LAB — CBC WITH DIFFERENTIAL/PLATELET
Abs Immature Granulocytes: 0.01 10*3/uL (ref 0.00–0.07)
Basophils Absolute: 0 10*3/uL (ref 0.0–0.1)
Basophils Relative: 1 %
Eosinophils Absolute: 0.1 10*3/uL (ref 0.0–0.5)
Eosinophils Relative: 3 %
HCT: 37 % (ref 36.0–46.0)
Hemoglobin: 11.6 g/dL — ABNORMAL LOW (ref 12.0–15.0)
Immature Granulocytes: 0 %
Lymphocytes Relative: 10 %
Lymphs Abs: 0.5 10*3/uL — ABNORMAL LOW (ref 0.7–4.0)
MCH: 31.1 pg (ref 26.0–34.0)
MCHC: 31.4 g/dL (ref 30.0–36.0)
MCV: 99.2 fL (ref 80.0–100.0)
Monocytes Absolute: 0.4 10*3/uL (ref 0.1–1.0)
Monocytes Relative: 7 %
Neutro Abs: 3.9 10*3/uL (ref 1.7–7.7)
Neutrophils Relative %: 79 %
Platelets: 209 10*3/uL (ref 150–400)
RBC: 3.73 MIL/uL — ABNORMAL LOW (ref 3.87–5.11)
RDW: 15.5 % (ref 11.5–15.5)
WBC: 4.9 10*3/uL (ref 4.0–10.5)
nRBC: 0 % (ref 0.0–0.2)

## 2021-08-26 LAB — BASIC METABOLIC PANEL
Anion gap: 12 (ref 5–15)
BUN: 21 mg/dL (ref 8–23)
CO2: 27 mmol/L (ref 22–32)
Calcium: 10.4 mg/dL — ABNORMAL HIGH (ref 8.9–10.3)
Chloride: 99 mmol/L (ref 98–111)
Creatinine, Ser: 0.96 mg/dL (ref 0.44–1.00)
GFR, Estimated: 57 mL/min — ABNORMAL LOW (ref >60)
Glucose, Bld: 96 mg/dL (ref 70–99)
Potassium: 3.6 mmol/L (ref 3.5–5.1)
Sodium: 138 mmol/L (ref 135–145)

## 2021-08-26 LAB — TROPONIN I (HIGH SENSITIVITY): Troponin I (High Sensitivity): 8 ng/L (ref <18)

## 2021-08-26 LAB — D-DIMER, QUANTITATIVE: D-Dimer, Quant: 1.34 ug/mL-FEU — ABNORMAL HIGH (ref 0.00–0.50)

## 2021-08-26 LAB — BRAIN NATRIURETIC PEPTIDE: B Natriuretic Peptide: 91.2 pg/mL (ref 0.0–100.0)

## 2021-08-26 MED ORDER — ALBUTEROL SULFATE HFA 108 (90 BASE) MCG/ACT IN AERS: 2.0000 | INHALATION_SPRAY | RESPIRATORY_TRACT | Status: DC | PRN

## 2021-08-26 MED ORDER — IOHEXOL 350 MG/ML SOLN
100.0000 mL | Freq: Once | INTRAVENOUS | Status: AC | PRN
  Administered 2021-08-26: 50 mL via INTRAVENOUS

## 2021-08-26 NOTE — ED Provider Notes (Signed)
[facility] EMER[facility]ENC[facility] DEPT Provider Note   CSN: [id number] Arrival date & time: 08/26/21  1510     History  Chief Complaint  Patient presents with   Shortness of Breath    Christine Vaughan is a 88 y.o. female presenting to ED with shortness of breath.  She reports that she woke up feeling well but then after breakfast she reports that began to feel short of breath.  She says she has a difficult time taking in a deep breath.  Denies any persistent coughing or fevers.  Her family did try nebulizer treatments as well as Xanax, she does suffer from significant anxiety.  This did not improve her shortness of breath.  She has chronic lower extremity edema but denies history of congestive heart failure  Family does report that she has a history of PE, is not anticoagulated, per shared medical decision making with her doctor.  Last echocardiogram in April 2021 showing an EF of 55 to 60%, moderate left ventricular hypertrophy.  No significant valvular disease.  She does follow with pulmonology in [place], has been treated for bronchiectasis 2/2 MAC infection.  HPI     Home Medications Prior to Admission medications   Medication Sig Start Date End Date Taking? Authorizing Provider  acetaminophen (TYLENOL) 500 MG tablet Take 500 mg by mouth every 6 (six) hours as needed for moderate pain or headache.    [provider]  albuterol (VENTOLIN HFA) 108 (90 Base) MCG/ACT inhaler Inhale 2 puffs into the lungs every 6 (six) hours as needed for wheezing or shortness of breath.    [provider]  ALPRAZolam Duanne Moron) 0.25 MG tablet Take 0.125 mg by mouth 2 (two) times daily.    [provider]  aspirin EC 81 MG tablet Take 81 mg by mouth at bedtime. Swallow whole.    [provider]  cephALEXin (KEFLEX) 500 MG capsule Take 1 capsule (500 mg total) by mouth 3 (three) times daily. 09/05/63   Lianne Cure, DO  esomeprazole (NEXIUM) 40 MG capsule Take  40 mg by mouth daily at 4 PM. 12/24/16   [provider]  EZALLOR SPRINKLE 10 MG CPSP Take 10 mg by mouth at bedtime. Open and sprinkle in applesauce/ Vanilla chocolate pudding 12/11/20   [provider]  gabapentin (NEURONTIN) 100 MG capsule Take 100-200 mg by mouth See admin instructions. Take 100 mg in the morning and 200 mg at bedtime    [provider]  ipratropium (ATROVENT) 0.06 % nasal spray Place 2 sprays into the nose 2 (two) times daily as needed for rhinitis. 03/19/18 06/26/21  [provider]  ipratropium-albuterol (DUONEB) 0.5-2.5 (3) MG/3ML SOLN Take 3 mLs by nebulization every 4 (four) hours as needed (bronchitis).    [provider]  levothyroxine (SYNTHROID, LEVOTHROID) 50 MCG tablet Take 50 mcg by mouth daily before breakfast.    [provider]  METAMUCIL FIBER PO Take 2 capsules by mouth daily.    [provider]  metroNIDAZOLE (FLAGYL) 500 MG tablet Take 1 tablet (500 mg total) by mouth 2 (two) times daily. 7/90/38   Campbell Stall P, DO  OXYGEN Inhale 2 L into the lungs daily as needed (shortness of breath).    [provider]  Psyllium (METAMUCIL PO) Take 15 mLs by mouth daily as needed. With water or Juice    [provider]  rosuvastatin (CRESTOR) 10 MG tablet Take 10 mg by mouth daily.    [provider]  sennosides-docusate sodium (SENOKOT-S) 8.6-50 MG tablet Take 1 tablet by mouth daily.    [provider]  sertraline (ZOLOFT) 25 MG tablet Take 1 tablet (25 mg total) by mouth daily. Patient taking differently: Take 25 mg by mouth at bedtime. 11/09/18   Rainey Pines, MD  sodium chloride 1 g tablet Take 1 g by mouth See admin instructions. 1800    [provider]  tobramycin, PF, (TOBI) 300 MG/5ML nebulizer solution Take 300 mg by nebulization daily as needed (shortness of breath).    [provider]  zinc oxide 20 % ointment Apply 1 application topically 4 (four)  times daily as needed for irritation.    [provider]      Allergies    Ciprofloxacin, Elemental sulfur, Erythromycin, Metronidazole, Tizanidine, Penicillins, and Sulfamethoxazole    Review of Systems   Review of Systems  Physical Exam Updated Vital Signs BP 132/89   Pulse 80   Temp 97.6 F (36.4 C)   Resp 12   Ht _0  (1.676 m)   Wt 52.2 kg   SpO2 95%   BMI 18.56 kg/m  Physical Exam Constitutional:      General: She is not in acute distress.    Comments: Thin, frail body habitus  HENT:     Head: Normocephalic and atraumatic.  Eyes:     Conjunctiva/sclera: Conjunctivae normal.     Pupils: Pupils are equal, round, and reactive to light.  Cardiovascular:     Rate and Rhythm: Normal rate and regular rhythm.  Pulmonary:     Effort: Pulmonary effort is normal. No respiratory distress.     Comments: Diminished breath sound in right lower lung 95 to 99% O2 saturation Abdominal:     General: There is no distension.     Tenderness: There is no abdominal tenderness.  Musculoskeletal:     Right lower leg: Edema present.     Left lower leg: Edema present.  Skin:    General: Skin is warm and dry.  Neurological:     General: No focal deficit present.     Mental Status: She is alert and oriented to person, place, and time. Mental status is at baseline.  Psychiatric:        Mood and Affect: Mood normal.        Behavior: Behavior normal.     ED Results / Procedures / Treatments   Labs (all labs ordered are listed, but only abnormal results are displayed) Labs Reviewed  BASIC METABOLIC PANEL - Abnormal; Notable for the following components:      Result Value   Calcium 10.4 (*)    GFR, Estimated 57 (*)    All other components within normal limits  CBC WITH DIFFERENTIAL/PLATELET - Abnormal; Notable for the following components:   RBC 3.73 (*)    Hemoglobin 11.6 (*)    Lymphs Abs 0.5 (*)    All other components within normal limits  D-DIMER, QUANTITATIVE -  Abnormal; Notable for the following components:   D-Dimer, Quant 1.34 (*)    All other components within normal limits  BRAIN NATRIURETIC PEPTIDE  TROPONIN I (HIGH SENSITIVITY)    EKG EKG Interpretation  Date/Time:  Sunday August 26 2021 15:25:12 EDT Ventricular Rate:  92 PR Interval:  142 QRS Duration: 96 QT Interval:  378 QTC Calculation: 467 R Axis:   -72 Text Interpretation: Normal sinus rhythm Left anterior fascicular block Moderate voltage criteria for LVH, may be normal variant ( R in aVL ,  [place] product When compared with ECG of 04-Jun-2021 15:25, PREVIOUS ECG IS PRESENT Confirmed by Octaviano Glow [phone]) on 08/26/2021 4:07:37 PM  Radiology CT Angio Chest PE W and/or Wo Contrast  Result Date: 08/26/2021 CLINICAL DATA:  88 year old with shortness of breath. Pulmonary embolism (PE) suspected, high prob EXAM: CT ANGIOGRAPHY CHEST WITH CONTRAST TECHNIQUE: Multidetector CT imaging of the chest was performed using the standard protocol during bolus administration of intravenous contrast. Multiplanar CT image reconstructions and MIPs were obtained to evaluate the vascular anatomy. RADIATION DOSE REDUCTION: This exam was performed according to the departmental dose-optimization program which includes automated exposure control, adjustment of the mA and/or kV according to patient size and/or use of iterative reconstruction technique. CONTRAST:  21m OMNIPAQUE IOHEXOL 350 MG/ML SOLN COMPARISON:  Radiograph earlier today. Chest CTA 3-1/2 months ago 05/11/2021, additional priors reviewed FINDINGS: Cardiovascular: There are no filling defects within the pulmonary arteries to suggest pulmonary embolus. Atherosclerotic tortuous thoracic aorta. No aneurysm. The heart is normal in size, slightly deviated to the left due to pectus excavatum deformity. No pericardial effusion. There are occasional coronary artery calcifications. Mediastinum/Nodes: Calcified hilar and mediastinal lymph nodes consistent with  prior granulomatous disease. No noncalcified adenopathy. Right axillary surgical clips. The esophagus is patulous and mildly dilated. No esophageal wall thickening. Lungs/Pleura: Biapical pleuroparenchymal scarring. Multifocal bronchiectasis with areas of mucoid impaction. Increased subsegmental bronchial filling in the right lung base with likely post obstructive change. There is a small layering right pleural effusion that is new. Chronic but worsening tree-in-bud opacities throughout both lungs consistent with chronic mycobacterial infection. Upper Abdomen: Calcified granuloma in the spleen. Cholecystectomy. No acute upper abdominal findings on limited assessment. Musculoskeletal: Chronic pectus excavatum deformity. There are no acute or suspicious osseous abnormalities. Generalized paucity of body fat. Right breast surgical clips. Review of the MIP images confirms the above findings. IMPRESSION: 1. No pulmonary embolus. 2. 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 Chronic but worsening tree-in-bud opacities throughout both lungs consistent with active disease. Multifocal bronchiectasis with mucoid impaction. Increasing bronchial filling in the right lower lobe with likely post obstructive change. 3. Small layering right pleural effusion is new. Aortic Atherosclerosis (ICD10-I70.0). Electronically Signed   By: MKeith RakeM.D.   On: 08/26/2021 18:43   DG Chest Port 1 View  Result Date: 08/26/2021 CLINICAL DATA:  Shortness of breath beginning today. EXAM: PORTABLE CHEST 1 VIEW COMPARISON:  05/11/2021 FINDINGS: The heart size and mediastinal contours are within normal limits. Stable chronic pulmonary interstitial prominence. No evidence of acute infiltrate. Bilateral chest wall surgical staples again noted. IMPRESSION: Chronic pulmonary interstitial prominence.  No acute findings. Electronically Signed   By: JMarlaine HindM.D.   On: 08/26/2021 16:36    Procedures Procedures     Medications Ordered in ED Medications  iohexol (OMNIPAQUE) 350 MG/ML injection 100 mL (50 mLs Intravenous Contrast Given 08/26/21 1808)    ED Course/ Medical Decision Making/ A&P Clinical Course as of 08/27/21 0945  Sun Aug 26, 2021  1801 USIV placed for CT [MT]  1904 No acute PE on CT scan.  Discussed with the patient and her daughters the findings of the chronic back disease.  They are requesting a referral to more local pulmonologist as they have not seen their pulmonologist in [place] "a long time".  I did place a referral in the system.  They will follow-up.  Okay for discharge [MT]    Clinical Course User Index [MT] Kemar Pandit, MCarola Rhine MD  Medical Decision Making Amount and/or Complexity of Data Reviewed Labs: ordered. Radiology: ordered.  Risk Prescription drug management.   This patient presents to the ED with concern for shortness of. This involves an extensive number of treatment options, and is a complaint that carries with it a high risk of complications and morbidity.  The differential diagnosis includes pneumonia versus anemia versus arrhythmia versus other pulmonary infection versus PE  Co-morbidities that complicate the patient evaluation: Family reporting history of PE, patient not anticoagulated, raises risk for recurrent PE.  History of MAC also raises risk for recurring lung infection  Additional history obtained from patient's daughters at the bedside  External records from outside source obtained and reviewed including pulmonology notes from care everywhere  I ordered and personally interpreted labs.  The pertinent results include:  Ddimer elevated, remaining labs unremarkable  I ordered imaging studies including dg chest, CT PE I independently visualized and interpreted imaging which showed chronic MAC, mucous plugging, no acute PE, no PNA I agree with the radiologist interpretation  The patient was maintained on a cardiac  monitor.  I personally viewed and interpreted the cardiac monitored which showed an underlying rhythm of: NSR  Per my interpretation the patient's ECG shows NSR  After the interventions noted above, I reevaluated the patient and found that they have: stayed the same  Patient's oxygen level and breathing remained comfortable and calm throughout her stay.  She is cachectic on exam, and I suspect her respiratory symptoms are multifactorial - including her frail habitus, her anxiety, and her MAC, which does show mucous plugging and bronchiectasis.  I recommended pulm f/u and placed a referral to a more local pulm clinic, which her family was requesting.   In the meantime she is stable for discharge home at this time.  Patient and her daughters at bedside were agreeable with this plan.  Dispostion:  After consideration of the diagnostic results and the patients response to treatment, I feel that the patent would benefit from outpatient pulm f/u.[name]         Final Clinical Impression(s) / ED Diagnoses Final diagnoses:  Shortness of breath    Rx / DC Orders ED Discharge Orders          Ordered    Ambulatory referral to Pulmonology       Comments: MAC - needs new provider in [facility]   08/26/21 1904              Wyvonnia Dusky, MD 08/27/21 [phone]

## 2021-08-26 NOTE — Discharge Instructions (Addendum)
You will need to follow-up with a pulmonary specialist.  I placed a referral with our local group.  If you do not hear from them in 3 business days, please call the number above to schedule follow-up appointment with a pulmonologist.

## 2021-08-26 NOTE — ED Notes (Signed)
Patient transported to CT 

## 2021-08-26 NOTE — ED Triage Notes (Signed)
Patient arrives with complaints of shortness of breath that started earlier today. Patient daughter states that she did her nebulizer treatment and took a small dose of xanax at home with minimal relief.   Patient does wear 2Liters of oxygen intermittently throughout the day, but did not get any relief.    Reports no pain.

## 2021-08-29 ENCOUNTER — Encounter (HOSPITAL_COMMUNITY): Payer: Self-pay

## 2021-08-29 ENCOUNTER — Emergency Department (HOSPITAL_COMMUNITY)
Admission: EM | Admit: 2021-08-29 | Discharge: 2021-08-29 | Disposition: A | Discharge status: Skilled Nursing Facility | Payer: Medicare PPO | Attending: Emergency Medicine | Admitting: Emergency Medicine

## 2021-08-29 ENCOUNTER — Other Ambulatory Visit: Payer: Self-pay

## 2021-08-29 ENCOUNTER — Ambulatory Visit: Payer: Medicare PPO | Admitting: Physician Assistant

## 2021-08-29 ENCOUNTER — Emergency Department (HOSPITAL_COMMUNITY): Payer: Medicare PPO

## 2021-08-29 ENCOUNTER — Encounter: Payer: Self-pay | Admitting: Physician Assistant

## 2021-08-29 VITALS — BP 100/62 | HR 85 | Ht 66.0 in | Wt 90.0 lb

## 2021-08-29 DIAGNOSIS — R0600 Dyspnea, unspecified: Secondary | ICD-10-CM | POA: Diagnosis not present

## 2021-08-29 DIAGNOSIS — R1310 Dysphagia, unspecified: Secondary | ICD-10-CM | POA: Insufficient documentation

## 2021-08-29 DIAGNOSIS — R1313 Dysphagia, pharyngeal phase: Secondary | ICD-10-CM

## 2021-08-29 DIAGNOSIS — R0602 Shortness of breath: Secondary | ICD-10-CM | POA: Insufficient documentation

## 2021-08-29 DIAGNOSIS — R531 Weakness: Secondary | ICD-10-CM | POA: Insufficient documentation

## 2021-08-29 DIAGNOSIS — Z7982 Long term (current) use of aspirin: Secondary | ICD-10-CM | POA: Insufficient documentation

## 2021-08-29 DIAGNOSIS — R634 Abnormal weight loss: Secondary | ICD-10-CM | POA: Diagnosis not present

## 2021-08-29 DIAGNOSIS — E46 Unspecified protein-calorie malnutrition: Secondary | ICD-10-CM

## 2021-08-29 DIAGNOSIS — R0689 Other abnormalities of breathing: Secondary | ICD-10-CM

## 2021-08-29 LAB — BASIC METABOLIC PANEL
Anion gap: 6 (ref 5–15)
BUN: 34 mg/dL — ABNORMAL HIGH (ref 8–23)
CO2: 28 mmol/L (ref 22–32)
Calcium: 9.9 mg/dL (ref 8.9–10.3)
Chloride: 103 mmol/L (ref 98–111)
Creatinine, Ser: 0.89 mg/dL (ref 0.44–1.00)
GFR, Estimated: >60 mL/min (ref >60)
Glucose, Bld: 101 mg/dL — ABNORMAL HIGH (ref 70–99)
Potassium: 4.5 mmol/L (ref 3.5–5.1)
Sodium: 137 mmol/L (ref 135–145)

## 2021-08-29 LAB — CBC
HCT: 37.6 % (ref 36.0–46.0)
Hemoglobin: 12 g/dL (ref 12.0–15.0)
MCH: 32 pg (ref 26.0–34.0)
MCHC: 31.9 g/dL (ref 30.0–36.0)
MCV: 100.3 fL — ABNORMAL HIGH (ref 80.0–100.0)
Platelets: 220 10*3/uL (ref 150–400)
RBC: 3.75 MIL/uL — ABNORMAL LOW (ref 3.87–5.11)
RDW: 15.5 % (ref 11.5–15.5)
WBC: 4.2 10*3/uL (ref 4.0–10.5)
nRBC: 0 % (ref 0.0–0.2)

## 2021-08-29 LAB — CBG MONITORING, ED: Glucose-Capillary: 94 mg/dL (ref 70–99)

## 2021-08-29 NOTE — ED Triage Notes (Signed)
Pt reports SHOB that began a few days ago and daughter reports progressive weakness and weight loss over a few weeks. Pt went to [facility] and was sent here for further evaluation and possible admission.

## 2021-08-29 NOTE — Progress Notes (Signed)
Subjective:    Patient ID: Christine Vaughan, female    DOB: 11-18-1931, 88 y.o.   MRN: [id number]  HPI [name] is a pleasant 88 year old white female, recently established with Dr. Candis Schatz who comes back in today with progressive difficulty with dysphagia and inability to eat.  She was last seen here in April 2023 with dysphagia and weight loss.  She had had a barium swallow in 2022 that showed possible distal esophageal stenosis with obstruction to barium tablet.  She had also had a swallowing evaluation in August 2022 that showed moderate pharyngeal phase dysphagia with significant residue of food in the pharynx which she manages by bringing back into the mouth and reswallowing, she had deep laryngeal penetration but no aspiration. She underwent EGD on 06/28/2021 per Dr. Candis Schatz which showed a normal-appearing esophagus, no stricture no esophagitis, she was TTS dilated empirically from 18 to 20 mm was noted to have multiple small gastric sessile appearing polyps, 4 these were biopsied and were hyperplastic.  Patient had ER visit on 08/26/2021 with complaints of progressive shortness of breath Labs show WBC is 6.1 hemoglobin 11.5/hematocrit 36.3 electrolytes within normal limit, BNP 91, troponin 8, D-dimer 1.34  CT angiography of the chest was done no evidence of pulmonary embolus, pectus excavatum deformity, calcified hilar and mediastinal lymph nodes consistent with prior granulomatous due to disease, patulous and mildly dilated esophagus, multifocal bronchiectasis with areas of mucoid impaction increased subsegmental bronchial filling in the right lung base likely postobstructive changes, small layering right pleural effusion that is new chronic but worsening tree-in-bud opacities throughout both lungs consistent with chronic Mycobacterium infection, status post cholecystectomy.  She was discharged back to her assisted living facility.  She is brought in by her daughter today, family and  patient concerned because she has not been able to eat much of anything and says she is primarily surviving on 2 boosts per day which she gets down very slowly.  She was able to eat half of a fish cake yesterday. Her weight is down 11 pounds since her last office visit here about 6 weeks ago and BMI is currently 14.  On careful questioning about her swallowing issues she says that it is a problem with swallowing that the food will not go down the back of her throat.  She is able to get her pills down, she is not having any episodes of regurgitation, because she is not eating much of anything, she does say she gets a lot of phlegm and foam in her mouth. She says she has been feeling overall fuzzy in the head over the past month, and progressively more short of breath.  She has not been using her oxygen regularly as she had been told to use this as needed, has been using inhalers etc. but says she feels as if she cannot breathe and that she may die. She has an appointment to see pulmonology/ on July 20 for initial evaluation.  She had been seen a few years back by a pulmonologist in [place].  Her other known medical problems include hypertension, DVT, prior PE, TIA, bronchiectasis, history of MI A infection-they are not sure when she was last on medication for this, history of breast cancer, hypothyroidism, anxiety.  O2 sat initially measured here with her on 2 L of oxygen was 84, repeat 72.  Long discussion today with the patient and daughter regarding her failure to thrive and rapid decline over the past couple of months.  She is currently at  an assisted living facility, says it takes over an hour sometimes to have someone come to her room to help her get to the bathroom etc.  She has difficulty ambulating just a few steps with a walker.  Patient related during our visit today that she did not think that she would live until July 20 to be seen by pulmonary with her current breathing  issues.  Review of Systems Pertinent positive and negative review of systems were noted in the above HPI section.  All other review of systems was otherwise negative.   Outpatient Encounter Medications as of 08/29/2021  Medication Sig   acetaminophen (TYLENOL) 500 MG tablet Take 500 mg by mouth every 6 (six) hours as needed for moderate pain or headache.   albuterol (VENTOLIN HFA) 108 (90 Base) MCG/ACT inhaler Inhale 2 puffs into the lungs every 6 (six) hours as needed for wheezing or shortness of breath.   ALPRAZolam (XANAX) 0.25 MG tablet Take 0.125 mg by mouth 2 (two) times daily.   aspirin EC 81 MG tablet Take 81 mg by mouth at bedtime. Swallow whole.   esomeprazole (NEXIUM) 40 MG capsule Take 40 mg by mouth daily at 4 PM.   gabapentin (NEURONTIN) 100 MG capsule Take 100-200 mg by mouth See admin instructions. Take 100 mg in the morning and 200 mg at bedtime   ipratropium-albuterol (DUONEB) 0.5-2.5 (3) MG/3ML SOLN Take 3 mLs by nebulization every 4 (four) hours as needed (bronchitis).   levothyroxine (SYNTHROID, LEVOTHROID) 50 MCG tablet Take 50 mcg by mouth daily before breakfast.   OXYGEN Inhale 2 L into the lungs daily as needed (shortness of breath).   rosuvastatin (CRESTOR) 10 MG tablet Take 10 mg by mouth daily.   sennosides-docusate sodium (SENOKOT-S) 8.6-50 MG tablet Take 1 tablet by mouth daily.   sertraline (ZOLOFT) 25 MG tablet Take 1 tablet (25 mg total) by mouth daily. (Patient taking differently: Take 25 mg by mouth at bedtime.)   zinc oxide 20 % ointment Apply 1 application topically 4 (four) times daily as needed for irritation.   EZALLOR SPRINKLE 10 MG CPSP Take 10 mg by mouth at bedtime. Open and sprinkle in applesauce/ Vanilla chocolate pudding (Patient not taking: Reported on 08/29/2021)   METAMUCIL FIBER PO Take 2 capsules by mouth daily. (Patient not taking: Reported on 08/29/2021)   Psyllium (METAMUCIL PO) Take 15 mLs by mouth daily as needed. With water or Juice (Patient  not taking: Reported on 08/29/2021)   [DISCONTINUED] cephALEXin (KEFLEX) 500 MG capsule Take 1 capsule (500 mg total) by mouth 3 (three) times daily.   [DISCONTINUED] ipratropium (ATROVENT) 0.06 % nasal spray Place 2 sprays into the nose 2 (two) times daily as needed for rhinitis.   [DISCONTINUED] metroNIDAZOLE (FLAGYL) 500 MG tablet Take 1 tablet (500 mg total) by mouth 2 (two) times daily.   [DISCONTINUED] sodium chloride 1 g tablet Take 1 g by mouth See admin instructions. 1800   [DISCONTINUED] tobramycin, PF, (TOBI) 300 MG/5ML nebulizer solution Take 300 mg by nebulization daily as needed (shortness of breath).   No facility-administered encounter medications on file as of 08/29/2021.   Allergies  Allergen Reactions   Ciprofloxacin Other (See Comments)    aggravates her neuropathy   Elemental Sulfur Other (See Comments)    Doesn't remember   Erythromycin Other (See Comments)    Gi distress   Metronidazole     Other reaction(s): Abdominal Pain   Tizanidine Other (See Comments)    GI upset  Penicillins Rash    Has patient had a PCN reaction causing immediate rash, facial/tongue/throat swelling, SOB or lightheadedness with hypotension: No Has patient had a PCN reaction causing severe rash involving mucus membranes or skin necrosis: No Has patient had a PCN reaction that required hospitalization No Has patient had a PCN reaction occurring within the last 10 years: no If all of the above answers are "NO", then may proceed with Cephalosporin use.   Sulfamethoxazole Rash    all over body   Patient Active Problem List   Diagnosis Date Noted   Dysphagia 06/01/2021   Dehydration 06/01/2021   Palliative care encounter 06/01/2021   Orthostatic hypotension 06/10/2019   TIA (transient ischemic attack) 06/09/2019   Hyponatremia 06/09/2019   Hypokalemia 06/09/2019   Chronic deep vein thrombosis (DVT) of right lower extremity ([place]) 03/25/2018   Pulmonary embolus ([place]) 03/25/2018   Use of  cane as ambulatory aid 01/15/2018   MAI (mycobacterium avium-intracellulare) infection ([place]) 06/16/2017   Headache syndrome 12/27/2015   Memory change 12/27/2015   Vitamin B12 deficiency 12/27/2015   Hereditary and idiopathic peripheral neuropathy 12/27/2015   Abnormality of gait 12/27/2015   Hypertension    Health care maintenance 10/12/2014   Mixed hyperlipidemia 10/07/2013   Trigeminal neuralgia 09/19/2013   Panic anxiety syndrome 09/19/2013   Osteopenia 09/19/2013   Hypothyroid 09/19/2013   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 09/19/2013   Depression, major, in remission ([place]) 09/19/2013   Bronchiectasis ([place]) 09/19/2013   History of radiation therapy 07/15/2011   Breast cancer ([place]) 07/15/2011   Scotoma involving central area of left eye 12/31/2010   Optic neuropathy 12/31/2010   Nuclear cataract 12/31/2010   Glaucoma suspect 12/31/2010   Social History   Socioeconomic History   Marital status: Widowed    Spouse name: Not on file   Number of children: 4   Years of education: Not on file   Highest education level: Not on file  Occupational History   Occupation: Retired  Tobacco Use   Smoking status: Never   Smokeless tobacco: Never  Vaping Use   Vaping Use: Never used  Substance and Sexual Activity   Alcohol use: No   Drug use: No   Sexual activity: Not Currently  Other Topics Concern   Not on file  Social History Narrative   Lives at home w/ her husband   Right-hand   Caffeine: none   Social Determinants of Health   Financial Resource Strain: Not on file  Food Insecurity: Not on file  Transportation Needs: Not on file  Physical Activity: Not on file  Stress: Not on file  Social Connections: Not on file  Intimate Partner Violence: Not on file    Ms. Douthit's family history includes Brain cancer in her mother; Breast cancer in her paternal aunt; Depression in her mother.      Objective:    Vitals:   08/29/21 1347  BP: 100/62  Pulse:  85    Physical Exam Well-developed acute and chronically ill-appearing very elderly white female quite frail, very thin, in a wheelchair accompanied by her daughter , on nasal O2  Weight, 90 BMI 14.5  HEENT; nontraumatic normocephalic, EOMI, PE R LA, sclera anicteric.  Pale Oropharynx; not examined today Neck; supple, no JVD Cardiovascular; regular rate and rhythm with S1-S2, no murmur rub or gallop Pulmonary; decreased breath sounds bilaterally Abdomen; soft, scaphoid nontender, nondistended, no palpable mass or hepatosplenomegaly, bowel sounds are active Rectal; not done today Skin; very thin, scattered ecchymoses Extremities;  bilateral ankle edema bluish appearing feet Neuro/Psych; alert and oriented x4, grossly nonfocal mood and affect appropriate        Assessment & Plan:   #51 88 year old white female with progressive failure to thrive, weight loss of 11 pounds over the past 6 weeks which is about 10% of her total body weight. Persistent progressive dysphagia-no significant findings on EGD May 2023, empiric dilation without improvement in symptoms She may have an underlying motility disorder but I actually think that most of her dysphagia symptoms are pharyngeal in nature with inability to get a food bolus from her mouth into the pharynx and prior swallowing evaluation had shown significant pooling and residual food in her pharynx.  This is likely neurogenic  #2 significant malnutrition secondary to above-she will likely need PEG tube unless decisions are made for a more palliative approach.  #3 progressive complaints of shortness of breath, inpatient on chronic oxygen, with CT angio earlier this week showing multifocal bronchiectasis and changes of chronic atypical Mycobacterium infection and new right effusion  #4 diverticular disease 5.  History of hypertension 6.  Anxiety 7.  Chronic neuropathy 8.  Prior history of DVT/PE 9.  Prior TIA 10.  History of breast  cancer  Plan; I think patient needs to be admitted to the hospital for failure to thrive to have a more expedient and comprehensive evaluation, Needs pulmonary consultation IR consultation for PEG tube placement if patient agreeable Palliative care consultation to help with goals of care, I do not think she is appropriate for assisted living level currently and likely will need higher level of care. Patient was reluctant to go to the emergency room and/or be admitted, she was hypoxic here, and patient and family eventually agreeable to ER evaluation with plans for admission. GI can certainly see her during admission however does not need endoscopic evaluation as this has just been done.    Terisha Losasso S Deion Forgue PA-C 08/29/2021   Cc: Reymundo Poll, MD

## 2021-08-29 NOTE — ED Notes (Signed)
Pt is on 2 LPM oxygen via [place].  Daughter states she has had it on since she came into the hospital today.

## 2021-08-29 NOTE — ED Provider Notes (Signed)
[facility] DEPT Provider Note   CSN: [id number] Arrival date & time: 08/29/21  1535     History  Chief Complaint  Patient presents with   Shortness of Breath   Weakness    Christine Vaughan is a 88 y.o. female.  HPI Patient was at her gastroenterologist office today for schedule appointment regarding esophageal disorder, when it was determined that she was hypoxic.  Apparently she was not on oxygen at the time.  She uses oxygen as needed.  She was evaluated 08/26/2021, at an affiliate emergency department, had comprehensive evaluation and was referred to pulmonary.  She has a follow-up appointment later this month with pulmonary.  This will be a new consultation with a doctor locally.  Previously she has been seeing pulmonary in [place].  Most recently she was encouraged to use tobramycin nebulizer, however she stopped using it because it causes ringing in her ears.  She is losing weight and has trouble eating for unclear reasons.  She last had endoscopy in April 2023.  She is treated for reflux disease.  CT angiogram chest, done 3 days ago did not show PE but did show chronic changes of bronchiectasis and new right pleural effusion which was small.  Patient states that she is hungry, low-dose food, but cannot eat because it is difficult to swallow anything.  She has had a few bites of some very small amounts of food each time.     She last saw her pulmonologist on 05/18/2021, following up on chronic bronchiectasis secondary to Mycobacterium AVM complex infection/colonization.  She has been intolerant of treatment with Zithromax and rifampin secondary to abdominal pain she was treated with inhaled tobramycin.  She is treated with home oxygen.  History complicated by esophageal stricture requiring dilatation.  History complicated by pulmonary embolism however not anticoagulated due to risk of falling.  She has a previous palliative consultation, with decision  made for partial code treatment specifically medications and compressions without intubation or mechanical ventilation.  It was felt that the patient would need to be reevaluated if her condition progressed. Endoscopy, 06/28/2021; normal esophagus without stricture, no esophagitis she was treated with a dilatation without complication.  Biopsies of the gastric polyp and duodenum mucosa were done.  Findings showed hyperplastic gastric polyp and duodenitis which was nonspecific.     Home Medications Prior to Admission medications   Medication Sig Start Date End Date Taking? Authorizing Provider  acetaminophen (TYLENOL) 500 MG tablet Take 500 mg by mouth every 6 (six) hours as needed for moderate pain or headache.    [provider]  albuterol (VENTOLIN HFA) 108 (90 Base) MCG/ACT inhaler Inhale 2 puffs into the lungs every 6 (six) hours as needed for wheezing or shortness of breath.    [provider]  ALPRAZolam Duanne Moron) 0.25 MG tablet Take 0.125 mg by mouth 2 (two) times daily.    [provider]  aspirin EC 81 MG tablet Take 81 mg by mouth at bedtime. Swallow whole.    [provider]  esomeprazole (NEXIUM) 40 MG capsule Take 40 mg by mouth daily at 4 PM. 12/24/16   [provider]  EZALLOR SPRINKLE 10 MG CPSP Take 10 mg by mouth at bedtime. Open and sprinkle in applesauce/ Vanilla chocolate pudding Patient not taking: Reported on 08/29/2021 12/11/20   [provider]  gabapentin (NEURONTIN) 100 MG capsule Take 100-200 mg by mouth See admin instructions. Take 100 mg in the morning and 200 mg at  bedtime    [provider]  ipratropium-albuterol (DUONEB) 0.5-2.5 (3) MG/3ML SOLN Take 3 mLs by nebulization every 4 (four) hours as needed (bronchitis).    [provider]  levothyroxine (SYNTHROID, LEVOTHROID) 50 MCG tablet Take 50 mcg by mouth daily before breakfast.    [provider]  METAMUCIL FIBER PO Take 2 capsules by  mouth daily. Patient not taking: Reported on 08/29/2021    [provider]  OXYGEN Inhale 2 L into the lungs daily as needed (shortness of breath).    [provider]  Psyllium (METAMUCIL PO) Take 15 mLs by mouth daily as needed. With water or Juice Patient not taking: Reported on 08/29/2021    [provider]  rosuvastatin (CRESTOR) 10 MG tablet Take 10 mg by mouth daily.    [provider]  sennosides-docusate sodium (SENOKOT-S) 8.6-50 MG tablet Take 1 tablet by mouth daily.    [provider]  sertraline (ZOLOFT) 25 MG tablet Take 1 tablet (25 mg total) by mouth daily. Patient taking differently: Take 25 mg by mouth at bedtime. 11/09/18   Rainey Pines, MD  zinc oxide 20 % ointment Apply 1 application topically 4 (four) times daily as needed for irritation.    [provider]      Allergies    Ciprofloxacin, Elemental sulfur, Erythromycin, Metronidazole, Tizanidine, Penicillins, and Sulfamethoxazole    Review of Systems   Review of Systems  Physical Exam Updated Vital Signs BP 122/70   Pulse 84   Temp 97.9 F (36.6 C) (Oral)   Resp 18   SpO2 100%  Physical Exam Vitals and nursing note reviewed.  Constitutional:      General: She is not in acute distress.    Appearance: She is not ill-appearing or toxic-appearing.     Comments: She is under nourished  HENT:     Head: Normocephalic and atraumatic.     Right Ear: External ear normal.     Left Ear: External ear normal.     Nose: No congestion or rhinorrhea.     Mouth/Throat:     Mouth: Mucous membranes are moist.  Eyes:     Conjunctiva/sclera: Conjunctivae normal.     Pupils: Pupils are equal, round, and reactive to light.  Neck:     Trachea: Phonation normal.  Cardiovascular:     Rate and Rhythm: Normal rate and regular rhythm.     Heart sounds: Normal heart sounds.  Pulmonary:     Effort: Pulmonary effort is normal.     Breath sounds: Normal breath sounds.  Abdominal:      General: There is no distension.     Palpations: Abdomen is soft. There is no mass.     Tenderness: There is no abdominal tenderness. There is no guarding.  Musculoskeletal:        General: Normal range of motion.     Cervical back: Normal range of motion and neck supple.  Skin:    General: Skin is warm and dry.     Coloration: Skin is not jaundiced or pale.  Neurological:     Mental Status: She is alert and oriented to person, place, and time.     Cranial Nerves: No cranial nerve deficit.     Sensory: No sensory deficit.     Motor: No abnormal muscle tone.     Coordination: Coordination normal.  Psychiatric:        Mood and Affect: Mood normal.        Behavior:  Behavior normal.        Thought Content: Thought content normal.        Judgment: Judgment normal.     ED Results / Procedures / Treatments   Labs (all labs ordered are listed, but only abnormal results are displayed) Labs Reviewed  BASIC METABOLIC PANEL - Abnormal; Notable for the following components:      Result Value   Glucose, Bld 101 (*)    BUN 34 (*)    All other components within normal limits  CBC - Abnormal; Notable for the following components:   RBC 3.75 (*)    MCV 100.3 (*)    All other components within normal limits  URINALYSIS, ROUTINE W REFLEX MICROSCOPIC  CBG MONITORING, ED    EKG None  Radiology DG Chest 2 View  Result Date: 08/29/2021 CLINICAL DATA:  Short of breath, weakness EXAM: CHEST - 2 VIEW COMPARISON:  CT chest and chest x-ray 08/26/2021 FINDINGS: Small right pleural effusion and mild right lower lobe infiltrate unchanged. Left lung clear. Heart size and vascularity normal. IMPRESSION: Small right effusion and right lower lobe infiltrate unchanged. Possible pneumonia. Electronically Signed   By: Franchot Gallo M.D.   On: 08/29/2021 16:59    Procedures Procedures    Medications Ordered in ED Medications - No data to display  ED Course/ Medical Decision Making/ A&P                            Medical Decision Making Elderly female was seeing her GI provider today, and was sent here for further evaluation.  She was hypoxic there but not on her oxygen which she uses regularly.  She has chronic bronchiectasis, followed by pulmonary and has recently been referred to a new pulmonologist.  That appointment is upcoming.  She has difficulty swallowing due to pharyngeal phase disorder which is not specified.  She has lost 11 pounds over the last 6 weeks.  She has previously had palliative care and remains a partial code.  Amount and/or Complexity of Data Reviewed Independent Historian: caregiver    Details: Patient's daughter in the room.  Patient, daughter and I had a long discussion about the concept of palliative care, the patient's status and whether or not she had acute abnormalities.  The patient appears stable today and does not require hospitalization specifically for acute decompensation.  She would benefit from revisiting with palliative care to discuss goals and any treatments that might be beneficial.  Patient currently lives in an assisted living facility where her care is overseen by her doctor who visits her every 2 months. Labs: ordered.    Details: CBC, metabolic panel-essential normal Radiology: ordered. Discussion of management or test interpretation with external provider(s): I discussed the case with the gastroenterology PA who saw her today, Amy Esterwood.  She was concerned that the patient was hypoxic to 72 while not on oxygen.  They have documented an 11 pound weight loss over the last 6-weeks. Her dysphagia is pharyngeal.  Patient felt that she was not doing well enough to go home, and the provider was concerned about failure to thrive, questioning possible G-tube placement, palliative care consult and pulmonary evaluation.  Consultation TOC-they were able to facilitate arrangements for palliative care consult at her assisted living facility.  She will be  evaluated by the [facility] provider for ongoing palliative care consultation and management.    Risk Decision regarding hospitalization. Risk Details: Patient presenting with ongoing  symptoms, weight loss and difficulty swallowing secondary to pharyngeal dysphagia.  Screening labs are reassuring.  She is nontoxic in appearance.  Oxygenation was low at the GI office but normal when it is supplemented.  She is on chronic oxygen therapy to use as needed.  We will encourage her to use the oxygen regularly.  She will be evaluated by palliative care as an outpatient, and further planning can be done from that setting.  She does not require hospitalization at this time.           Final Clinical Impression(s) / ED Diagnoses Final diagnoses:  Dysphagia, unspecified type  Weight loss    Rx / DC Orders ED Discharge Orders     None         Daleen Bo, MD 08/29/21 1930

## 2021-08-29 NOTE — Discharge Instructions (Signed)
We have arranged for palliative care consultation at your assisted living facility, [facility].  They will be contacting the facility and you to set up the visit.  Continue taking your usual medication.  Use your oxygen more frequently, whenever you are having trouble breathing or your oxygen saturation drops below 90%.  Return here as needed for problems.

## 2021-08-30 NOTE — Progress Notes (Signed)
Agree with the assessment and plan as outlined by Nicoletta Ba, PA-C.  Unfortunately, the patient appears to be declining rather quickly.  Agree with palliative care consultation.  Patient's dysphagia is not due to esophageal obstruction.  Agree with a likely neurogenic etiology and that gastrostomy tube placement would be necessary to correct her malnutrition, but given her overall frail status and other comorbidities, palliative care may be more appropriate.  Abdoulie Tierce E. Candis Schatz, MD

## 2021-09-13 ENCOUNTER — Institutional Professional Consult (permissible substitution): Payer: Medicare PPO | Admitting: Pulmonary Disease

## 2021-09-21 ENCOUNTER — Ambulatory Visit: Payer: Medicare PPO | Admitting: Gastroenterology

## 2021-09-28 ENCOUNTER — Institutional Professional Consult (permissible substitution): Payer: Medicare PPO | Admitting: Pulmonary Disease

## 2021-12-26 DEATH — deceased
